# Patient Record
Sex: Male | Born: 1945 | Race: White | Hispanic: No | Marital: Married | Smoking: Former smoker
Health system: Southern US, Community
[De-identification: ages and names within clinical notes are randomized; demographics above are authoritative.]

## PROBLEM LIST (undated history)

## (undated) DIAGNOSIS — G8929 Other chronic pain: Secondary | ICD-10-CM

## (undated) DIAGNOSIS — I639 Cerebral infarction, unspecified: Secondary | ICD-10-CM

## (undated) DIAGNOSIS — G459 Transient cerebral ischemic attack, unspecified: Secondary | ICD-10-CM

## (undated) DIAGNOSIS — R413 Other amnesia: Secondary | ICD-10-CM

## (undated) DIAGNOSIS — M51369 Other intervertebral disc degeneration, lumbar region without mention of lumbar back pain or lower extremity pain: Secondary | ICD-10-CM

## (undated) DIAGNOSIS — H9319 Tinnitus, unspecified ear: Secondary | ICD-10-CM

## (undated) DIAGNOSIS — K219 Gastro-esophageal reflux disease without esophagitis: Secondary | ICD-10-CM

## (undated) DIAGNOSIS — I5042 Chronic combined systolic (congestive) and diastolic (congestive) heart failure: Secondary | ICD-10-CM

## (undated) DIAGNOSIS — R202 Paresthesia of skin: Secondary | ICD-10-CM

## (undated) DIAGNOSIS — J449 Chronic obstructive pulmonary disease, unspecified: Secondary | ICD-10-CM

## (undated) DIAGNOSIS — F419 Anxiety disorder, unspecified: Secondary | ICD-10-CM

## (undated) DIAGNOSIS — R200 Anesthesia of skin: Secondary | ICD-10-CM

## (undated) DIAGNOSIS — C61 Malignant neoplasm of prostate: Secondary | ICD-10-CM

## (undated) DIAGNOSIS — J189 Pneumonia, unspecified organism: Secondary | ICD-10-CM

## (undated) DIAGNOSIS — H9190 Unspecified hearing loss, unspecified ear: Secondary | ICD-10-CM

## (undated) DIAGNOSIS — M5136 Other intervertebral disc degeneration, lumbar region: Secondary | ICD-10-CM

## (undated) DIAGNOSIS — I10 Essential (primary) hypertension: Secondary | ICD-10-CM

## (undated) DIAGNOSIS — I519 Heart disease, unspecified: Secondary | ICD-10-CM

## (undated) DIAGNOSIS — M1990 Unspecified osteoarthritis, unspecified site: Secondary | ICD-10-CM

## (undated) DIAGNOSIS — I77819 Aortic ectasia, unspecified site: Secondary | ICD-10-CM

## (undated) DIAGNOSIS — I2510 Atherosclerotic heart disease of native coronary artery without angina pectoris: Secondary | ICD-10-CM

## (undated) DIAGNOSIS — M549 Dorsalgia, unspecified: Secondary | ICD-10-CM

## (undated) DIAGNOSIS — S82892A Other fracture of left lower leg, initial encounter for closed fracture: Secondary | ICD-10-CM

## (undated) DIAGNOSIS — G4700 Insomnia, unspecified: Secondary | ICD-10-CM

## (undated) DIAGNOSIS — S83242A Other tear of medial meniscus, current injury, left knee, initial encounter: Secondary | ICD-10-CM

## (undated) DIAGNOSIS — E785 Hyperlipidemia, unspecified: Secondary | ICD-10-CM

## (undated) DIAGNOSIS — I509 Heart failure, unspecified: Secondary | ICD-10-CM

## (undated) DIAGNOSIS — F32A Depression, unspecified: Secondary | ICD-10-CM

## (undated) DIAGNOSIS — R911 Solitary pulmonary nodule: Secondary | ICD-10-CM

## (undated) DIAGNOSIS — G709 Myoneural disorder, unspecified: Secondary | ICD-10-CM

## (undated) DIAGNOSIS — F329 Major depressive disorder, single episode, unspecified: Secondary | ICD-10-CM

## (undated) HISTORY — DX: Insomnia, unspecified: G47.00

## (undated) HISTORY — DX: Tinnitus, unspecified ear: H93.19

## (undated) HISTORY — PX: TONSILECTOMY/ADENOIDECTOMY WITH MYRINGOTOMY: SHX6125

## (undated) HISTORY — PX: COLONOSCOPY: SHX174

## (undated) HISTORY — DX: Major depressive disorder, single episode, unspecified: F32.9

## (undated) HISTORY — PX: VASECTOMY: SHX75

## (undated) HISTORY — DX: Depression, unspecified: F32.A

## (undated) HISTORY — DX: Other amnesia: R41.3

## (undated) HISTORY — PX: UPPER GI ENDOSCOPY: SHX6162

## (undated) HISTORY — DX: Transient cerebral ischemic attack, unspecified: G45.9

---
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
## 1998-09-10 ENCOUNTER — Ambulatory Visit: Admission: RE | Admit: 1998-09-10 | Discharge: 1998-09-10 | Payer: Self-pay | Admitting: Cardiology

## 1999-05-21 ENCOUNTER — Encounter: Admission: RE | Admit: 1999-05-21 | Discharge: 1999-07-12 | Payer: Self-pay | Admitting: Family Medicine

## 1999-07-27 ENCOUNTER — Encounter: Payer: Self-pay | Admitting: Orthopedic Surgery

## 1999-07-27 ENCOUNTER — Encounter: Admission: RE | Admit: 1999-07-27 | Discharge: 1999-07-27 | Payer: Self-pay | Admitting: Orthopedic Surgery

## 2003-10-01 HISTORY — PX: OTHER SURGICAL HISTORY: SHX169

## 2004-02-13 ENCOUNTER — Ambulatory Visit (HOSPITAL_COMMUNITY): Admission: RE | Admit: 2004-02-13 | Discharge: 2004-02-13 | Payer: Self-pay | Admitting: Orthopedic Surgery

## 2005-11-12 ENCOUNTER — Encounter (INDEPENDENT_AMBULATORY_CARE_PROVIDER_SITE_OTHER): Payer: Self-pay | Admitting: *Deleted

## 2005-11-12 ENCOUNTER — Ambulatory Visit (HOSPITAL_COMMUNITY): Admission: RE | Admit: 2005-11-12 | Discharge: 2005-11-12 | Payer: Self-pay | Admitting: Gastroenterology

## 2007-10-01 HISTORY — PX: SPINAL CORD STIMULATOR IMPLANT: SHX2422

## 2007-10-19 ENCOUNTER — Encounter: Admission: RE | Admit: 2007-10-19 | Discharge: 2007-10-19 | Payer: Self-pay | Admitting: Specialist

## 2008-09-30 DIAGNOSIS — R911 Solitary pulmonary nodule: Secondary | ICD-10-CM

## 2008-09-30 HISTORY — DX: Solitary pulmonary nodule: R91.1

## 2008-10-28 ENCOUNTER — Encounter: Admission: RE | Admit: 2008-10-28 | Discharge: 2008-10-28 | Payer: Self-pay | Admitting: Family Medicine

## 2010-10-21 ENCOUNTER — Encounter: Payer: Self-pay | Admitting: Specialist

## 2011-12-05 ENCOUNTER — Other Ambulatory Visit: Payer: Self-pay | Admitting: Orthopedic Surgery

## 2012-01-10 ENCOUNTER — Encounter (HOSPITAL_BASED_OUTPATIENT_CLINIC_OR_DEPARTMENT_OTHER): Payer: Self-pay | Admitting: *Deleted

## 2012-01-10 NOTE — Progress Notes (Signed)
NPO AFTER MN. ARRIVES AT 1030. NEEDS HG AND EKG. WILL TAKE MORPHINE AND FLEXERIL AM OF SURG. W/ SIP OF WATER.

## 2012-01-15 ENCOUNTER — Encounter (HOSPITAL_BASED_OUTPATIENT_CLINIC_OR_DEPARTMENT_OTHER)
Admission: RE | Disposition: A | Discharge status: Home / Self Care | Payer: Self-pay | Source: Ambulatory Visit | Attending: Orthopedic Surgery

## 2012-01-15 ENCOUNTER — Encounter (HOSPITAL_BASED_OUTPATIENT_CLINIC_OR_DEPARTMENT_OTHER): Payer: Self-pay | Admitting: *Deleted

## 2012-01-15 ENCOUNTER — Ambulatory Visit (HOSPITAL_BASED_OUTPATIENT_CLINIC_OR_DEPARTMENT_OTHER)
Admission: RE | Admit: 2012-01-15 | Discharge: 2012-01-15 | Disposition: A | Discharge status: Home / Self Care | Payer: BC Managed Care – PPO | Source: Ambulatory Visit | Attending: Orthopedic Surgery | Admitting: Orthopedic Surgery

## 2012-01-15 ENCOUNTER — Encounter (HOSPITAL_BASED_OUTPATIENT_CLINIC_OR_DEPARTMENT_OTHER): Payer: Self-pay | Admitting: Anesthesiology

## 2012-01-15 ENCOUNTER — Ambulatory Visit (HOSPITAL_BASED_OUTPATIENT_CLINIC_OR_DEPARTMENT_OTHER): Payer: BC Managed Care – PPO | Admitting: Anesthesiology

## 2012-01-15 ENCOUNTER — Encounter (HOSPITAL_BASED_OUTPATIENT_CLINIC_OR_DEPARTMENT_OTHER): Payer: Self-pay | Admitting: Orthopedic Surgery

## 2012-01-15 DIAGNOSIS — Z7982 Long term (current) use of aspirin: Secondary | ICD-10-CM | POA: Insufficient documentation

## 2012-01-15 DIAGNOSIS — Z79899 Other long term (current) drug therapy: Secondary | ICD-10-CM | POA: Insufficient documentation

## 2012-01-15 DIAGNOSIS — IMO0002 Reserved for concepts with insufficient information to code with codable children: Secondary | ICD-10-CM | POA: Insufficient documentation

## 2012-01-15 DIAGNOSIS — M2390 Unspecified internal derangement of unspecified knee: Secondary | ICD-10-CM | POA: Insufficient documentation

## 2012-01-15 DIAGNOSIS — X58XXXA Exposure to other specified factors, initial encounter: Secondary | ICD-10-CM | POA: Insufficient documentation

## 2012-01-15 DIAGNOSIS — S83249A Other tear of medial meniscus, current injury, unspecified knee, initial encounter: Secondary | ICD-10-CM

## 2012-01-15 HISTORY — PX: KNEE ARTHROSCOPY: SHX127

## 2012-01-15 HISTORY — DX: Other intervertebral disc degeneration, lumbar region without mention of lumbar back pain or lower extremity pain: M51.369

## 2012-01-15 HISTORY — DX: Other intervertebral disc degeneration, lumbar region: M51.36

## 2012-01-15 HISTORY — DX: Other chronic pain: G89.29

## 2012-01-15 HISTORY — DX: Unspecified osteoarthritis, unspecified site: M19.90

## 2012-01-15 HISTORY — DX: Dorsalgia, unspecified: M54.9

## 2012-01-15 HISTORY — DX: Other tear of medial meniscus, current injury, left knee, initial encounter: S83.242A

## 2012-01-15 LAB — POCT HEMOGLOBIN-HEMACUE: Hemoglobin: 13.8 g/dL (ref 13.0–17.0)

## 2012-01-15 SURGERY — ARTHROSCOPY, KNEE
Anesthesia: General | Site: Knee | Laterality: Left | Wound class: Clean

## 2012-01-15 MED ORDER — CEFAZOLIN SODIUM-DEXTROSE 2-3 GM-% IV SOLR
2.0000 g | INTRAVENOUS | Status: AC
  Administered 2012-01-15: 2 g via INTRAVENOUS

## 2012-01-15 MED ORDER — SODIUM CHLORIDE 0.9 % IV SOLN: INTRAVENOUS | Status: DC

## 2012-01-15 MED ORDER — LACTATED RINGERS IV SOLN
INTRAVENOUS | Status: DC
  Administered 2012-01-15 (×2): via INTRAVENOUS
  Administered 2012-01-15: 100 mL/h via INTRAVENOUS

## 2012-01-15 MED ORDER — OXYCODONE HCL 5 MG PO TABS
5.0000 mg | ORAL_TABLET | Freq: Once | ORAL | Status: AC
  Administered 2012-01-15: 5 mg via ORAL

## 2012-01-15 MED ORDER — LIDOCAINE HCL (CARDIAC) 20 MG/ML IV SOLN
INTRAVENOUS | Status: DC | PRN
  Administered 2012-01-15: 80 mg via INTRAVENOUS

## 2012-01-15 MED ORDER — PROPOFOL 10 MG/ML IV EMUL
INTRAVENOUS | Status: DC | PRN
  Administered 2012-01-15: 300 mg via INTRAVENOUS

## 2012-01-15 MED ORDER — OXYCODONE-ACETAMINOPHEN 10-325 MG PO TABS: 1.0000 | ORAL_TABLET | ORAL | Status: AC | PRN

## 2012-01-15 MED ORDER — METHOCARBAMOL 500 MG PO TABS: 500.0000 mg | ORAL_TABLET | Freq: Four times a day (QID) | ORAL | Status: AC

## 2012-01-15 MED ORDER — DEXAMETHASONE SODIUM PHOSPHATE 10 MG/ML IJ SOLN: 10.0000 mg | Freq: Once | INTRAMUSCULAR | Status: DC

## 2012-01-15 MED ORDER — DEXAMETHASONE SODIUM PHOSPHATE 4 MG/ML IJ SOLN
INTRAMUSCULAR | Status: DC | PRN
  Administered 2012-01-15: 10 mg via INTRAVENOUS

## 2012-01-15 MED ORDER — FENTANYL CITRATE 0.05 MG/ML IJ SOLN
INTRAMUSCULAR | Status: DC | PRN
  Administered 2012-01-15: 50 ug via INTRAVENOUS
  Administered 2012-01-15 (×4): 25 ug via INTRAVENOUS
  Administered 2012-01-15: 50 ug via INTRAVENOUS
  Administered 2012-01-15 (×4): 25 ug via INTRAVENOUS
  Administered 2012-01-15: 50 ug via INTRAVENOUS

## 2012-01-15 MED ORDER — ACETAMINOPHEN 10 MG/ML IV SOLN
1000.0000 mg | Freq: Once | INTRAVENOUS | Status: AC
  Administered 2012-01-15: 1000 mg via INTRAVENOUS

## 2012-01-15 MED ORDER — BUPIVACAINE-EPINEPHRINE 0.25% -1:200000 IJ SOLN
INTRAMUSCULAR | Status: DC | PRN
  Administered 2012-01-15: 20 mL

## 2012-01-15 MED ORDER — SODIUM CHLORIDE 0.9 % IR SOLN
Status: DC | PRN
  Administered 2012-01-15: 9000 mL

## 2012-01-15 MED ORDER — ONDANSETRON HCL 4 MG/2ML IJ SOLN
INTRAMUSCULAR | Status: DC | PRN
  Administered 2012-01-15: 4 mg via INTRAVENOUS

## 2012-01-15 MED ORDER — CHLORHEXIDINE GLUCONATE 4 % EX LIQD: 60.0000 mL | Freq: Once | CUTANEOUS | Status: DC

## 2012-01-15 SURGICAL SUPPLY — 32 items
BANDAGE ELASTIC 6 VELCRO ST LF (GAUZE/BANDAGES/DRESSINGS) ×3 IMPLANT
BLADE 4.2CUDA (BLADE) ×2 IMPLANT
BLADE CUDA SHAVER 3.5 (BLADE) IMPLANT
BLADE CUTTER GATOR 3.5 (BLADE) IMPLANT
CANISTER SUCT LVC 12 LTR MEDI- (MISCELLANEOUS) ×2 IMPLANT
CANISTER SUCTION 2500CC (MISCELLANEOUS) IMPLANT
CLOTH BEACON ORANGE TIMEOUT ST (SAFETY) ×2 IMPLANT
DRAPE ARTHROSCOPY W/POUCH 114 (DRAPES) ×2 IMPLANT
DRSG EMULSION OIL 3X3 NADH (GAUZE/BANDAGES/DRESSINGS) ×2 IMPLANT
DURAPREP 26ML APPLICATOR (WOUND CARE) ×2 IMPLANT
ELECT MENISCUS 165MM 90D (ELECTRODE) IMPLANT
ELECT REM PT RETURN 9FT ADLT (ELECTROSURGICAL)
ELECTRODE REM PT RTRN 9FT ADLT (ELECTROSURGICAL) IMPLANT
GLOVE BIO SURGEON STRL SZ8 (GLOVE) ×2 IMPLANT
GLOVE INDICATOR 8.0 STRL GRN (GLOVE) ×2 IMPLANT
GOWN PREVENTION PLUS LG XLONG (DISPOSABLE) ×4 IMPLANT
IV NS IRRIG 3000ML ARTHROMATIC (IV SOLUTION) ×3 IMPLANT
KNEE WRAP E Z 3 GEL PACK (MISCELLANEOUS) ×2 IMPLANT
PACK ARTHROSCOPY DSU (CUSTOM PROCEDURE TRAY) ×2 IMPLANT
PACK BASIN DAY SURGERY FS (CUSTOM PROCEDURE TRAY) ×2 IMPLANT
PADDING CAST ABS 4INX4YD NS (CAST SUPPLIES) ×1
PADDING CAST ABS COTTON 4X4 ST (CAST SUPPLIES) ×1 IMPLANT
PADDING CAST COTTON 6X4 STRL (CAST SUPPLIES) ×2 IMPLANT
PENCIL BUTTON HOLSTER BLD 10FT (ELECTRODE) IMPLANT
SET ARTHROSCOPY TUBING (MISCELLANEOUS) ×2
SET ARTHROSCOPY TUBING LN (MISCELLANEOUS) ×1 IMPLANT
SPONGE GAUZE 4X4 12PLY (GAUZE/BANDAGES/DRESSINGS) ×2 IMPLANT
SUT ETHILON 4 0 PS 2 18 (SUTURE) ×2 IMPLANT
TOWEL OR 17X24 6PK STRL BLUE (TOWEL DISPOSABLE) ×2 IMPLANT
WAND 30 DEG SABER W/CORD (SURGICAL WAND) IMPLANT
WAND 90 DEG TURBOVAC W/CORD (SURGICAL WAND) ×1 IMPLANT
WATER STERILE IRR 500ML POUR (IV SOLUTION) ×2 IMPLANT

## 2012-01-15 NOTE — Op Note (Signed)
Preoperative diagnosis-  Left knee medial meniscal tear  Postoperative diagnosis Left- knee medial meniscal tear   Plus Left medial chondral defect  Procedure- Left knee arthroscopy with medial  Meniscal debridement and chondroplasty  Surgeon- Gus Rankin. Tiler Brandis, MD  Anesthesia-General  EBL-  minimal Complications- None  Condition- PACU - hemodynamically stable.  Brief clinical note- -Joseph Foster is a 66 y.o.  male with a several month history of left knee pain and mechanical symptoms. Exam and history suggested medial meniscal tear confirmed by MRI. The patient presents now for arthroscopy and debridement   Procedure in detail -       After successful administration of General anesthetic, a tourmiquet is placed high on the Left  thigh and the Left lower extremity is prepped and draped in the usual sterile fashion. Time out is performed by the surgical team. Standard superomedial and inferolateral portal sites are marked and incisions made with an 11 blade. The inflow cannula is passed through the superomedial portal and camera through the inferolateral portal and inflow is initiated. Arthroscopic visualization proceeds.      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 The medial and lateral gutters are visualized and there are  2 loose bodies which were removed through the inferomedial portal after it was started. Flexion and valgus force is applied to the knee and the medial compartment is entered. A spinal needle is passed into the joint through the site marked for the inferomedial portal. A small incision is made and the dilator passed into the joint. The findings for the medial compartment are posterior horn medial meniscal tear and small chondral defect medial femoral condyle . The tear is debrided to a stable base with baskets and a shaver and sealed off with the  Arthrocare. The shaver is used to debride the unstable cartilage to a stable cartilaginous base with stable edges. It is probed and found to be stable.    The intercondylar notch is visualized and the ACL appears normal. The lateral compartment is entered and the findings are mild surface irregularity of the lateral meniscus otherwise normal lateral compartment. .      The joint is again inspected and there are no other tears, defects or loose bodies identified. The arthroscopic equipment is then removed from the inferior portals which are closed with interrupted 4-0 nylon. 20 ml of .25% Marcaine with epinephrine are injected through the inflow cannula and the cannula is then removed and the portal closed with nylon. The incisions are cleaned and dried and a bulky sterile dressing is applied. The patient is then awakened and transported to recovery in stable condition.   01/15/2012, 12:48 PM

## 2012-01-15 NOTE — Interval H&P Note (Signed)
History and Physical Interval Note:  01/15/2012 12:08 PM  Joseph Foster  has presented today for surgery, with the diagnosis of left knee medial meniscal tear  The various methods of treatment have been discussed with the patient and family. After consideration of risks, benefits and other options for treatment, the patient has consented to  Procedure(s) (LRB): ARTHROSCOPY KNEE (Left) as a surgical intervention .  The patients' history has been reviewed, patient examined, no change in status, stable for surgery.  I have reviewed the patients' chart and labs.  Questions were answered to the patient's satisfaction.     Loanne Drilling

## 2012-01-15 NOTE — Anesthesia Postprocedure Evaluation (Signed)
  Anesthesia Post-op Note  Patient: Joseph Foster  Procedure(s) Performed: Procedure(s) (LRB): ARTHROSCOPY KNEE (Left)  Patient Location: PACU  Anesthesia Type: General  Level of Consciousness: oriented and sedated  Airway and Oxygen Therapy: Patient Spontanous Breathing  Post-op Pain: mild  Post-op Assessment: Post-op Vital signs reviewed, Patient's Cardiovascular Status Stable, Respiratory Function Stable and Patent Airway  Post-op Vital Signs: stable  Complications: No apparent anesthesia complications

## 2012-01-15 NOTE — H&P (Signed)
  CC- [name] is a 66 y.o. male who presents with left knee pain.  HPI- . Knee Pain: Patient presents with knee pain involving the  left knee. Onset of the symptoms was several months ago. Inciting event: none known. Current symptoms include pain located medially. Pain is aggravated by pivoting, rising after sitting, squatting and walking.  Patient has had no prior knee problems. Evaluation to date: plain films: normal. Treatment to date: corticosteroid injection which was not very effective.  Past Medical History  Diagnosis Date  . DDD (degenerative disc disease), lumbar L4  . Chronic back pain   . Arthritis KNEES  . Acute medial meniscus tear of left knee     Past Surgical History  Procedure Date  . Spinal cord stimulator implant 2009  . Right knee surg. 2005    Prior to Admission medications   Medication Sig Start Date End Date Taking? Authorizing Provider  aspirin EC 325 MG tablet Take 175.5 mg by mouth daily.   Yes Historical Provider, MD  cyclobenzaprine (FLEXERIL) 10 MG tablet Take 10 mg by mouth every morning.   Yes Historical Provider, MD  morphine (MSIR) 15 MG tablet Take 15 mg by mouth every morning.   Yes Historical Provider, MD  Multiple Vitamin (MULTIVITAMIN) tablet Take 1 tablet by mouth daily.   Yes Historical Provider, MD  PARoxetine (PAXIL) 10 MG tablet Take 10 mg by mouth every morning.   Yes Historical Provider, MD   KNEE EXAM antalgic gait, soft tissue tenderness over medial joint line, collateral ligaments intact, normal ipsilateral hip exam  Physical Examination: General appearance - alert, well appearing, and in no distress Mental status - alert, oriented to person, place, and time Chest - clear to auscultation, no wheezes, rales or rhonchi, symmetric air entry Heart - normal rate, regular rhythm, normal S1, S2, no murmurs, rubs, clicks or gallops Abdomen - soft, nontender, nondistended, no masses or organomegaly Neurological - alert, oriented,  normal speech, no focal findings or movement disorder noted   Asessment/Plan--- Left knee medial meniscal tear- - Plan left knee arthroscopy with meniscal debridement. Procedure risks and potential comps discussed with patient who elects to proceed. Goals are decreased pain and increased function with a high likelihood of achieving both

## 2012-01-15 NOTE — Discharge Instructions (Signed)
Arthroscopic Procedure, Knee An arthroscopic procedure can find what is wrong with your knee. PROCEDURE Arthroscopy is a surgical technique that allows your orthopedic surgeon to diagnose and treat your knee injury with accuracy. They will look into your knee through a small instrument. This is almost like a small (pencil sized) telescope. Because arthroscopy affects your knee less than open knee surgery, you can anticipate a more rapid recovery. Taking an active role by following your caregiver's instructions will help with rapid and complete recovery. Use crutches, rest, elevation, ice, and knee exercises as instructed. The length of recovery depends on various factors including type of injury, age, physical condition, medical conditions, and your rehabilitation. Your knee is the joint between the large bones (femur and tibia) in your leg. Cartilage covers these bone ends which are smooth and slippery and allow your knee to bend and move smoothly. Two menisci, thick, semi-lunar shaped pads of cartilage which form a rim inside the joint, help absorb shock and stabilize your knee. Ligaments bind the bones together and support your knee joint. Muscles move the joint, help support your knee, and take stress off the joint itself. Because of this all programs and physical therapy to rehabilitate an injured or repaired knee require rebuilding and strengthening your muscles. AFTER THE PROCEDURE  After the procedure, you will be moved to a recovery area until most of the effects of the medication have worn off. Your caregiver will discuss the test results with you.   Only take over-the-counter or prescription medicines for pain, discomfort, or fever as directed by your caregiver.  SEEK MEDICAL CARE IF:   You have increased bleeding from your wounds.   You see redness, swelling, or have increasing pain in your wounds.   You have pus coming from your wound.   You have an oral temperature above 102 F (38.9  C).   You notice a bad smell coming from the wound or dressing.   You have severe pain with any motion of your knee.  SEEK IMMEDIATE MEDICAL CARE IF:   You develop a rash.   You have difficulty breathing.   You have any allergic problems.  Document Released: 09/13/2000 Document Revised: 09/05/2011 Document Reviewed: 04/06/2008 [employer] Patient Information 2012 [employer] Post Anesthesia Home Care Instructions  Activity: Get plenty of rest for the remainder of the day. A responsible adult should stay with you for 24 hours following the procedure.  For the next 24 hours, DO NOT: -Drive a car -Operate machinery -Drink alcoholic beverages -Take any medication unless instructed by your physician -Make any legal decisions or sign important papers.  Meals: Start with liquid foods such as gelatin or soup. Progress to regular foods as tolerated. Avoid greasy, spicy, heavy foods. If nausea and/or vomiting occur, drink only clear liquids until the nausea and/or vomiting subsides. Call your physician if vomiting continues.  Special Instructions/Symptoms: Your throat may feel dry or sore from the anesthesia or the breathing tube placed in your throat during surgery. If this causes discomfort, gargle with warm salt water. The discomfort should disappear within 24 hours.  . 

## 2012-01-15 NOTE — Anesthesia Procedure Notes (Signed)
Procedure Name: LMA Insertion Date/Time: 01/15/2012 12:14 PM Performed by: Fran Lowes Pre-anesthesia Checklist: Patient identified, Emergency Drugs available, Suction available and Patient being monitored Patient Re-evaluated:Patient Re-evaluated prior to inductionOxygen Delivery Method: Circle System Utilized Preoxygenation: Pre-oxygenation with 100% oxygen Intubation Type: IV induction Ventilation: Mask ventilation without difficulty LMA: LMA inserted LMA Size: 5.0 Number of attempts: 1 Airway Equipment and Method: bite block Placement Confirmation: positive ETCO2 Tube secured with: Tape Dental Injury: Teeth and Oropharynx as per pre-operative assessment  Comments: LMA inserted by Dr. Shireen Quan.

## 2012-01-15 NOTE — Transfer of Care (Signed)
Immediate Anesthesia Transfer of Care Note  Patient: Joseph Foster  Procedure(s) Performed: Procedure(s) (LRB): ARTHROSCOPY KNEE (Left)  Patient Location: Patient transported to PACU with oxygen via face mask at 4 Liters / Min  Anesthesia Type: General  Level of Consciousness: awake and alert   Airway & Oxygen Therapy: Patient Spontanous Breathing and Patient connected to face mask oxygen  Post-op Assessment: Report given to PACU RN and Post -op Vital signs reviewed and stable  Post vital signs: Reviewed and stable  Dentition: Teeth and oropharynx remain in pre-op condition  Complications: No apparent anesthesia complications

## 2012-01-15 NOTE — Anesthesia Preprocedure Evaluation (Signed)
Anesthesia Evaluation  Patient identified by MRN, date of birth, ID band Patient awake    Reviewed: Allergy & Precautions, H&P , NPO status , Patient's Chart, lab work & pertinent test results, reviewed documented beta blocker date and time   Airway Mallampati: II TM Distance: >3 FB Neck ROM: Full    Dental  (+) Teeth Intact   Pulmonary neg pulmonary ROS,  breath sounds clear to auscultation        Cardiovascular negative cardio ROS  Rhythm:Regular Rate:Normal  denies cardiac symptomos   Neuro/Psych Chronic back pain Nerve stim/narcotics negative psych ROS   GI/Hepatic negative GI ROS, Neg liver ROS,   Endo/Other  negative endocrine ROS  Renal/GU negative Renal ROS  negative genitourinary   Musculoskeletal   Abdominal   Peds negative pediatric ROS (+)  Hematology negative hematology ROS (+)   Anesthesia Other Findings   Reproductive/Obstetrics negative OB ROS                           Anesthesia Physical Anesthesia Plan  ASA: II  Anesthesia Plan: General   Post-op Pain Management:    Induction: Intravenous  Airway Management Planned: LMA  Additional Equipment:   Intra-op Plan:   Post-operative Plan: Extubation in OR  Informed Consent: I have reviewed the patients History and Physical, chart, labs and discussed the procedure including the risks, benefits and alternatives for the proposed anesthesia with the patient or authorized representative who has indicated his/her understanding and acceptance.   Dental advisory given  Plan Discussed with: CRNA and Surgeon  Anesthesia Plan Comments:         Anesthesia Quick Evaluation

## 2012-01-16 ENCOUNTER — Encounter (HOSPITAL_BASED_OUTPATIENT_CLINIC_OR_DEPARTMENT_OTHER): Payer: Self-pay | Admitting: Orthopedic Surgery

## 2012-01-21 ENCOUNTER — Encounter (HOSPITAL_BASED_OUTPATIENT_CLINIC_OR_DEPARTMENT_OTHER): Payer: Self-pay

## 2012-03-20 ENCOUNTER — Other Ambulatory Visit: Payer: Self-pay | Admitting: Gastroenterology

## 2013-04-19 ENCOUNTER — Ambulatory Visit (INDEPENDENT_AMBULATORY_CARE_PROVIDER_SITE_OTHER): Payer: BC Managed Care – PPO | Admitting: Diagnostic Neuroimaging

## 2013-04-19 ENCOUNTER — Encounter: Payer: Self-pay | Admitting: Diagnostic Neuroimaging

## 2013-04-19 VITALS — BP 131/91 | HR 75 | Temp 97.8°F | Ht 74.5 in | Wt 251.0 lb

## 2013-04-19 DIAGNOSIS — R209 Unspecified disturbances of skin sensation: Secondary | ICD-10-CM

## 2013-04-19 DIAGNOSIS — R200 Anesthesia of skin: Secondary | ICD-10-CM | POA: Insufficient documentation

## 2013-04-19 NOTE — Patient Instructions (Signed)
I will check electrical nerve testing.

## 2013-04-19 NOTE — Progress Notes (Signed)
[facility]  PATIENT: Joseph Foster DOB: [birth year]  REFERRING CLINICIAN: Little HISTORY FROM: patient REASON FOR VISIT: new consult   HISTORICAL  CHIEF COMPLAINT:  Chief Complaint  Patient presents with  . Neurologic Problem    np#[id number]    HISTORY OF PRESENT ILLNESS:   67 year old male, right-handed, with history of anxiety, back pain, knee pain, here for evaluation of lower extremity numbness and tingling. He last 3 months he describes burning, tingling sensation on the bottom of his feet. His nose chronic low back pain, status post spinal cord stimulator 3 years ago. No low back decompression surgery.   Numbness is confined to the bottom of feet. No symptoms on the top of his feet or lower legs he has some numbness in his left thumb.  REVIEW OF SYSTEMS: Full 14 system review of systems performed and notable only for numbness hearing loss ringing constipation impotence try pain feeling hot feeling cold decreased energy.  ALLERGIES: Allergies  Allergen Reactions  . Ceclor (Cefaclor) Itching  . Penicillins Hives    HOME MEDICATIONS: Outpatient Prescriptions Prior to Visit  Medication Sig Dispense Refill  . ALPRAZolam (XANAX) 0.5 MG tablet Take 0.5 mg by mouth at bedtime as needed.      [name] aspirin EC 325 MG tablet Take 175.5 mg by mouth daily.      . cyclobenzaprine (FLEXERIL) 10 MG tablet Take 10 mg by mouth every morning.      [name] morphine (MSIR) 15 MG tablet Take 15 mg by mouth every morning.      . Multiple Vitamin (MULTIVITAMIN) tablet Take 1 tablet by mouth daily.      [name] PARoxetine (PAXIL) 10 MG tablet Take 10 mg by mouth every morning.       No facility-administered medications prior to visit.    PAST MEDICAL HISTORY: Past Medical History  Diagnosis Date  . DDD (degenerative disc disease), lumbar L4  . Chronic back pain   . Arthritis KNEES  . Acute medial meniscus tear of left knee     PAST SURGICAL HISTORY: Past Surgical History    Procedure Laterality Date  . Spinal cord stimulator implant  2009  . Right knee surg.  2005  . Knee arthroscopy  01/15/2012    Procedure: ARTHROSCOPY KNEE;  Surgeon: Loanne Drilling, MD;  Location: [facility];  Service: Orthopedics;  Laterality: Left;  meniscal debridement    FAMILY HISTORY: Family History  Problem Relation Age of Onset  . Lung cancer Father     SOCIAL HISTORY:  History   Social History  . Marital Status: Married    Spouse Name: N/A    Number of Children: 3  . Years of Education: N/A   Occupational History  . [employer]    Social History Main Topics  . Smoking status: Former Smoker    Types: Cigars    Quit date: 01/09/2001  . Smokeless tobacco: Never Used  . Alcohol Use: 5.4 oz/week    6 Cans of beer, 3 Shots of liquor per week  . Drug Use: No  . Sexually Active:    Other Topics Concern  . Not on file   Social History Narrative  . No narrative on file     PHYSICAL EXAM  Filed Vitals:   04/19/13 1126  BP: 131/91  Pulse: 75  Temp: 97.8 F (36.6 C)  TempSrc: Oral  Height: 6' 2.5" (1.892 m)  Weight: 251 lb (113.853 kg)    Not recorded  Body mass index is 31.81 kg/(m^2).  GENERAL EXAM: Patient is in no distress; STRAIGHT LEG RAISE NEG; POSITIVE PHALENS.  CARDIOVASCULAR: Regular rate and rhythm, no murmurs, no carotid bruits  NEUROLOGIC: MENTAL STATUS: awake, alert, language fluent, comprehension intact, naming intact CRANIAL NERVE: no papilledema on fundoscopic exam, pupils equal and reactive to light, visual fields full to confrontation, extraocular muscles intact, no nystagmus, facial sensation and strength symmetric, uvula midline, shoulder shrug symmetric, tongue midline. MOTOR: normal bulk and tone, full strength in the BUE, BLE; LEFT APB ATROPHY.  SENSORY: ABSENT VIB AT TOES. DECR PP IN FEET. COORDINATION: finger-nose-finger, fine finger movements normal REFLEXES: BUE AND KNEES 1, ANKLES 0. DOWN GOING  TOES. GAIT/STATION: narrow based gait; DIFF WITH TOE AND HEEL. ABLE TO TANDEM. ROMBERG NEG.    DIAGNOSTIC DATA (LABS, IMAGING, TESTING) - I reviewed patient records, labs, notes, testing and imaging myself where available.  Lab Results  Component Value Date   HGB 13.8 01/15/2012   No results found for this basename: na, k, cl, co2, glucose, bun, creatinine, calcium, prot, albumin, ast, alt, alkphos, bilitot, gfrnonaa, gfraa   No results found for this basename: CHOL, HDL, LDLCALC, LDLDIRECT, TRIG, CHOLHDL   No results found for this basename: HGBA1C   No results found for this basename: VITAMINB12   No results found for this basename: TSH    B12 415, TSH 0.83   ASSESSMENT AND PLAN  67 y.o. year old male  has a past medical history of DDD (degenerative disc disease), lumbar (L4); Chronic back pain; Arthritis (KNEES); and Acute medial meniscus tear of left knee. here with numbness/buring in bottom of feet.  Ddx: neuropathy vs lumbar radiculopathy  PLAN: 1. EMG/NCS 2. Consider CT myelogram (he should discuss with spine surgeons first) 3. Consider gabapentin (pt wants to hold off for now)   Orders Placed This Encounter  Procedures  . NCV with EMG(electromyography)     Suanne Marker, MD 04/19/2013, 12:23 PM Certified in Neurology, Neurophysiology and Neuroimaging  [facility] [address] [place], [place] [address] [phone]

## 2013-04-20 ENCOUNTER — Encounter: Payer: Self-pay | Admitting: Diagnostic Neuroimaging

## 2013-04-29 ENCOUNTER — Ambulatory Visit (INDEPENDENT_AMBULATORY_CARE_PROVIDER_SITE_OTHER): Payer: BC Managed Care – PPO | Admitting: Diagnostic Neuroimaging

## 2013-04-29 ENCOUNTER — Encounter (INDEPENDENT_AMBULATORY_CARE_PROVIDER_SITE_OTHER): Payer: BC Managed Care – PPO

## 2013-04-29 DIAGNOSIS — R200 Anesthesia of skin: Secondary | ICD-10-CM

## 2013-04-29 DIAGNOSIS — Z0289 Encounter for other administrative examinations: Secondary | ICD-10-CM

## 2013-04-29 DIAGNOSIS — R209 Unspecified disturbances of skin sensation: Secondary | ICD-10-CM

## 2013-05-06 NOTE — Procedures (Signed)
   [facility]  NCS (NERVE CONDUCTION STUDY) WITH EMG (ELECTROMYOGRAPHY) REPORT   STUDY DATE: 04/29/13 PATIENT NAME: Joseph Foster DOB: 11/20/1945 MRN: [id number]  ORDERING CLINICIAN: Joycelyn Schmid, MD   TECHNOLOGIST: Gearldine Shown ELECTROMYOGRAPHER: Glenford Bayley. Govani Radloff, MD  CLINICAL INFORMATION: 67 year old male with numbness and pain in the lower extremities.  FINDINGS: NERVE CONDUCTION STUDY: Right peroneal motor response with recording over the extensor digitorum brevis muscle could not be obtained. Right peroneal motor response with recording over the tibial anterior muscle demonstrates normal distal latency, decreased amplitude, normal conduction velocity. Left peroneal motor response with recording over the extensor digitorum brevis muscle demonstrates decreased amplitude, normal conduction velocity and normal F-wave latency.  Bilateral tibial motor responses have normal distal latencies, amplitudes, conduction velocities and F-wave latencies.   Bilateral peroneal sensory responses are normal.   NEEDLE ELECTROMYOGRAPHY: Needle examination of selected muscles of the right lower extremity (vastus medialis, tibialis anterior, gastrocnemius) demonstrates no abnormal spontaneous activity at rest and normal motor unit recruitment on exertion. Examination of bilateral L5-S1 paraspinal muscles demonstrate complex repetitive discharges and 1+ positive sharp waves in the right L5-S1 region.   IMPRESSION:  Abnormal study demonstrating: 1. Electrodiagnostic evidence of bilateral L5 radiculopathies. 2. No evidence of widespread polyneuropathy.   INTERPRETING PHYSICIAN:  Suanne Marker, MD Certified in Neurology, Neurophysiology and Neuroimaging  [facility] [address] [place], [place] [address] [phone]

## 2013-05-27 DIAGNOSIS — G8929 Other chronic pain: Secondary | ICD-10-CM | POA: Insufficient documentation

## 2013-05-27 DIAGNOSIS — M25569 Pain in unspecified knee: Secondary | ICD-10-CM | POA: Insufficient documentation

## 2013-05-27 DIAGNOSIS — G894 Chronic pain syndrome: Secondary | ICD-10-CM | POA: Insufficient documentation

## 2013-09-30 DIAGNOSIS — G459 Transient cerebral ischemic attack, unspecified: Secondary | ICD-10-CM

## 2013-09-30 HISTORY — DX: Transient cerebral ischemic attack, unspecified: G45.9

## 2014-08-22 ENCOUNTER — Inpatient Hospital Stay (HOSPITAL_COMMUNITY)
Admission: EM | Admit: 2014-08-22 | Discharge: 2014-08-23 | DRG: 069 | Disposition: A | Discharge status: Home / Self Care | Payer: BC Managed Care – PPO | Attending: Internal Medicine | Admitting: Internal Medicine

## 2014-08-22 ENCOUNTER — Emergency Department (HOSPITAL_COMMUNITY): Payer: BC Managed Care – PPO

## 2014-08-22 ENCOUNTER — Encounter (HOSPITAL_COMMUNITY): Payer: Self-pay | Admitting: Family Medicine

## 2014-08-22 DIAGNOSIS — R451 Restlessness and agitation: Secondary | ICD-10-CM | POA: Insufficient documentation

## 2014-08-22 DIAGNOSIS — G4733 Obstructive sleep apnea (adult) (pediatric): Secondary | ICD-10-CM | POA: Diagnosis present

## 2014-08-22 DIAGNOSIS — Z9852 Vasectomy status: Secondary | ICD-10-CM

## 2014-08-22 DIAGNOSIS — R410 Disorientation, unspecified: Secondary | ICD-10-CM | POA: Diagnosis not present

## 2014-08-22 DIAGNOSIS — G4730 Sleep apnea, unspecified: Secondary | ICD-10-CM | POA: Diagnosis present

## 2014-08-22 DIAGNOSIS — R200 Anesthesia of skin: Secondary | ICD-10-CM

## 2014-08-22 DIAGNOSIS — R29810 Facial weakness: Secondary | ICD-10-CM | POA: Diagnosis present

## 2014-08-22 DIAGNOSIS — F329 Major depressive disorder, single episode, unspecified: Secondary | ICD-10-CM | POA: Diagnosis present

## 2014-08-22 DIAGNOSIS — E669 Obesity, unspecified: Secondary | ICD-10-CM | POA: Diagnosis present

## 2014-08-22 DIAGNOSIS — N400 Enlarged prostate without lower urinary tract symptoms: Secondary | ICD-10-CM | POA: Diagnosis present

## 2014-08-22 DIAGNOSIS — M5136 Other intervertebral disc degeneration, lumbar region: Secondary | ICD-10-CM | POA: Diagnosis present

## 2014-08-22 DIAGNOSIS — Z6833 Body mass index (BMI) 33.0-33.9, adult: Secondary | ICD-10-CM

## 2014-08-22 DIAGNOSIS — E785 Hyperlipidemia, unspecified: Secondary | ICD-10-CM | POA: Diagnosis present

## 2014-08-22 DIAGNOSIS — M1990 Unspecified osteoarthritis, unspecified site: Secondary | ICD-10-CM | POA: Diagnosis present

## 2014-08-22 DIAGNOSIS — Z720 Tobacco use: Secondary | ICD-10-CM | POA: Diagnosis present

## 2014-08-22 DIAGNOSIS — F17210 Nicotine dependence, cigarettes, uncomplicated: Secondary | ICD-10-CM | POA: Diagnosis present

## 2014-08-22 DIAGNOSIS — Z7982 Long term (current) use of aspirin: Secondary | ICD-10-CM

## 2014-08-22 DIAGNOSIS — J449 Chronic obstructive pulmonary disease, unspecified: Secondary | ICD-10-CM | POA: Diagnosis present

## 2014-08-22 DIAGNOSIS — G894 Chronic pain syndrome: Secondary | ICD-10-CM | POA: Diagnosis present

## 2014-08-22 DIAGNOSIS — F32A Depression, unspecified: Secondary | ICD-10-CM | POA: Diagnosis present

## 2014-08-22 DIAGNOSIS — G8929 Other chronic pain: Secondary | ICD-10-CM | POA: Diagnosis present

## 2014-08-22 DIAGNOSIS — M549 Dorsalgia, unspecified: Secondary | ICD-10-CM | POA: Diagnosis present

## 2014-08-22 DIAGNOSIS — F418 Other specified anxiety disorders: Secondary | ICD-10-CM | POA: Diagnosis present

## 2014-08-22 DIAGNOSIS — S83249A Other tear of medial meniscus, current injury, unspecified knee, initial encounter: Secondary | ICD-10-CM | POA: Diagnosis present

## 2014-08-22 DIAGNOSIS — G454 Transient global amnesia: Secondary | ICD-10-CM

## 2014-08-22 DIAGNOSIS — G452 Multiple and bilateral precerebral artery syndromes: Secondary | ICD-10-CM

## 2014-08-22 DIAGNOSIS — Z880 Allergy status to penicillin: Secondary | ICD-10-CM | POA: Diagnosis not present

## 2014-08-22 DIAGNOSIS — F1010 Alcohol abuse, uncomplicated: Secondary | ICD-10-CM | POA: Diagnosis present

## 2014-08-22 DIAGNOSIS — G451 Carotid artery syndrome (hemispheric): Secondary | ICD-10-CM

## 2014-08-22 DIAGNOSIS — G459 Transient cerebral ischemic attack, unspecified: Principal | ICD-10-CM | POA: Diagnosis present

## 2014-08-22 LAB — URINALYSIS, ROUTINE W REFLEX MICROSCOPIC
Bilirubin Urine: NEGATIVE
Glucose, UA: NEGATIVE mg/dL
Ketones, ur: NEGATIVE mg/dL
Leukocytes, UA: NEGATIVE
Nitrite: NEGATIVE
Protein, ur: NEGATIVE mg/dL
Specific Gravity, Urine: 1.014 (ref 1.005–1.030)
Urobilinogen, UA: 0.2 mg/dL (ref 0.0–1.0)
pH: 6 (ref 5.0–8.0)

## 2014-08-22 LAB — URINE MICROSCOPIC-ADD ON

## 2014-08-22 LAB — RAPID URINE DRUG SCREEN, HOSP PERFORMED
Amphetamines: NOT DETECTED
Barbiturates: NOT DETECTED
Benzodiazepines: POSITIVE — AB
Cocaine: NOT DETECTED
Opiates: POSITIVE — AB
Tetrahydrocannabinol: NOT DETECTED

## 2014-08-22 LAB — CBC
HCT: 40.7 % (ref 39.0–52.0)
Hemoglobin: 13.8 g/dL (ref 13.0–17.0)
MCH: 30.7 pg (ref 26.0–34.0)
MCHC: 33.9 g/dL (ref 30.0–36.0)
MCV: 90.6 fL (ref 78.0–100.0)
Platelets: 199 10*3/uL (ref 150–400)
RBC: 4.49 MIL/uL (ref 4.22–5.81)
RDW: 13.1 % (ref 11.5–15.5)
WBC: 5.9 10*3/uL (ref 4.0–10.5)

## 2014-08-22 LAB — CBG MONITORING, ED: Glucose-Capillary: 114 mg/dL — ABNORMAL HIGH (ref 70–99)

## 2014-08-22 LAB — I-STAT TROPONIN, ED: Troponin i, poc: 0.01 ng/mL (ref 0.00–0.08)

## 2014-08-22 LAB — DIFFERENTIAL
Basophils Absolute: 0.1 10*3/uL (ref 0.0–0.1)
Basophils Relative: 1 % (ref 0–1)
Eosinophils Absolute: 0.3 10*3/uL (ref 0.0–0.7)
Eosinophils Relative: 5 % (ref 0–5)
Lymphocytes Relative: 33 % (ref 12–46)
Lymphs Abs: 2 10*3/uL (ref 0.7–4.0)
Monocytes Absolute: 0.5 10*3/uL (ref 0.1–1.0)
Monocytes Relative: 8 % (ref 3–12)
Neutro Abs: 3.2 10*3/uL (ref 1.7–7.7)
Neutrophils Relative %: 53 % (ref 43–77)

## 2014-08-22 LAB — COMPREHENSIVE METABOLIC PANEL
ALT: 16 U/L (ref 0–53)
AST: 18 U/L (ref 0–37)
Albumin: 3.5 g/dL (ref 3.5–5.2)
Alkaline Phosphatase: 29 U/L — ABNORMAL LOW (ref 39–117)
Anion gap: 12 (ref 5–15)
BUN: 24 mg/dL — ABNORMAL HIGH (ref 6–23)
CO2: 24 mEq/L (ref 19–32)
Calcium: 8.9 mg/dL (ref 8.4–10.5)
Chloride: 102 mEq/L (ref 96–112)
Creatinine, Ser: 1.04 mg/dL (ref 0.50–1.35)
GFR calc Af Amer: 84 mL/min — ABNORMAL LOW (ref >90)
GFR calc non Af Amer: 73 mL/min — ABNORMAL LOW (ref >90)
Glucose, Bld: 112 mg/dL — ABNORMAL HIGH (ref 70–99)
Potassium: 4.9 mEq/L (ref 3.7–5.3)
Sodium: 138 mEq/L (ref 137–147)
Total Bilirubin: <0.2 mg/dL — ABNORMAL LOW (ref 0.3–1.2)
Total Protein: 7.2 g/dL (ref 6.0–8.3)

## 2014-08-22 LAB — PROTIME-INR
INR: 1.1 (ref 0.00–1.49)
Prothrombin Time: 14.3 seconds (ref 11.6–15.2)

## 2014-08-22 LAB — GLUCOSE, CAPILLARY: Glucose-Capillary: 103 mg/dL — ABNORMAL HIGH (ref 70–99)

## 2014-08-22 LAB — HEMOGLOBIN A1C
Hgb A1c MFr Bld: 5.6 % (ref <5.7)
Mean Plasma Glucose: 114 mg/dL (ref <117)

## 2014-08-22 LAB — APTT: aPTT: 38 seconds — ABNORMAL HIGH (ref 24–37)

## 2014-08-22 MED ORDER — LORAZEPAM 1 MG PO TABS: 1.0000 mg | ORAL_TABLET | Freq: Four times a day (QID) | ORAL | Status: DC | PRN

## 2014-08-22 MED ORDER — MORPHINE SULFATE ER 15 MG PO TBCR
30.0000 mg | EXTENDED_RELEASE_TABLET | Freq: Two times a day (BID) | ORAL | Status: DC
  Administered 2014-08-22 – 2014-08-23 (×2): 30 mg via ORAL
  Filled 2014-08-22 (×2): qty 2

## 2014-08-22 MED ORDER — VITAMIN B-1 100 MG PO TABS
100.0000 mg | ORAL_TABLET | Freq: Every day | ORAL | Status: DC
  Administered 2014-08-22: 100 mg via ORAL
  Filled 2014-08-22 (×2): qty 1

## 2014-08-22 MED ORDER — ENOXAPARIN SODIUM 40 MG/0.4ML ~~LOC~~ SOLN
40.0000 mg | SUBCUTANEOUS | Status: DC
  Administered 2014-08-22: 40 mg via SUBCUTANEOUS
  Filled 2014-08-22: qty 0.4

## 2014-08-22 MED ORDER — NICOTINE 14 MG/24HR TD PT24
14.0000 mg | MEDICATED_PATCH | TRANSDERMAL | Status: DC
  Filled 2014-08-22: qty 1

## 2014-08-22 MED ORDER — MORPHINE SULFATE ER 15 MG PO TBCR: 30.0000 mg | EXTENDED_RELEASE_TABLET | Freq: Two times a day (BID) | ORAL | Status: DC

## 2014-08-22 MED ORDER — SODIUM CHLORIDE 0.9 % IV SOLN
INTRAVENOUS | Status: AC
  Administered 2014-08-22: 15:00:00 via INTRAVENOUS

## 2014-08-22 MED ORDER — DOCUSATE SODIUM 100 MG PO CAPS
100.0000 mg | ORAL_CAPSULE | Freq: Two times a day (BID) | ORAL | Status: DC
  Administered 2014-08-22 – 2014-08-23 (×2): 100 mg via ORAL
  Filled 2014-08-22 (×3): qty 1

## 2014-08-22 MED ORDER — FOLIC ACID 1 MG PO TABS
1.0000 mg | ORAL_TABLET | Freq: Every day | ORAL | Status: DC
  Administered 2014-08-22 – 2014-08-23 (×2): 1 mg via ORAL
  Filled 2014-08-22 (×2): qty 1

## 2014-08-22 MED ORDER — ONDANSETRON HCL 4 MG PO TABS: 4.0000 mg | ORAL_TABLET | Freq: Three times a day (TID) | ORAL | Status: DC | PRN

## 2014-08-22 MED ORDER — ALPRAZOLAM 0.5 MG PO TABS: 0.5000 mg | ORAL_TABLET | Freq: Two times a day (BID) | ORAL | Status: DC | PRN

## 2014-08-22 MED ORDER — STROKE: EARLY STAGES OF RECOVERY BOOK
Freq: Once | Status: AC
  Administered 2014-08-22: 18:00:00
  Filled 2014-08-22: qty 1

## 2014-08-22 MED ORDER — CYCLOBENZAPRINE HCL 10 MG PO TABS: 10.0000 mg | ORAL_TABLET | Freq: Every day | ORAL | Status: DC | PRN

## 2014-08-22 MED ORDER — ADULT MULTIVITAMIN W/MINERALS CH
1.0000 | ORAL_TABLET | Freq: Every day | ORAL | Status: DC
  Administered 2014-08-22 – 2014-08-23 (×2): 1 via ORAL
  Filled 2014-08-22 (×2): qty 1

## 2014-08-22 MED ORDER — ACETAMINOPHEN 325 MG PO TABS: 650.0000 mg | ORAL_TABLET | ORAL | Status: DC | PRN

## 2014-08-22 MED ORDER — TAMSULOSIN HCL 0.4 MG PO CAPS
0.4000 mg | ORAL_CAPSULE | Freq: Every day | ORAL | Status: DC
  Administered 2014-08-23: 0.4 mg via ORAL
  Filled 2014-08-22 (×2): qty 1

## 2014-08-22 MED ORDER — NAPHAZOLINE HCL 0.1 % OP SOLN
1.0000 [drp] | Freq: Four times a day (QID) | OPHTHALMIC | Status: DC | PRN
  Filled 2014-08-22: qty 15

## 2014-08-22 MED ORDER — SALINE SPRAY 0.65 % NA SOLN
1.0000 | NASAL | Status: DC | PRN
  Filled 2014-08-22: qty 44

## 2014-08-22 MED ORDER — LORAZEPAM 2 MG/ML IJ SOLN: 1.0000 mg | Freq: Four times a day (QID) | INTRAMUSCULAR | Status: DC | PRN

## 2014-08-22 MED ORDER — ASPIRIN EC 81 MG PO TBEC
81.0000 mg | DELAYED_RELEASE_TABLET | Freq: Every day | ORAL | Status: DC
  Administered 2014-08-22: 81 mg via ORAL
  Filled 2014-08-22: qty 1

## 2014-08-22 MED ORDER — THIAMINE HCL 100 MG/ML IJ SOLN
100.0000 mg | Freq: Every day | INTRAMUSCULAR | Status: DC
  Administered 2014-08-23: 100 mg via INTRAVENOUS
  Filled 2014-08-22: qty 2

## 2014-08-22 NOTE — ED Notes (Signed)
Per pt family sts woke up this am with confusion. Pt has left facial droop and grip weaker on the left side. Pt confused to month and year. sts also he hasnt been feeling well the last 2 days.

## 2014-08-22 NOTE — ED Notes (Signed)
Neuro at bedside.

## 2014-08-22 NOTE — Progress Notes (Signed)
Pt arrived to 4N19 via stretcher.  Pt welcomed and oriented to the room.  Pt ambulated from stretcher to bed without difficulty.  Telemetry monitor applied. Call bell within reach.  Will continue to monitor. Cori Razor, RN

## 2014-08-22 NOTE — ED Provider Notes (Addendum)
CSN: [id number]     Arrival date & time 08/22/14  7846 History   First MD Initiated Contact with Patient 08/22/14 [id number]     Chief Complaint  Patient presents with  . Cerebrovascular Accident     (Consider location/radiation/quality/duration/timing/severity/associated sxs/prior Treatment) Patient is a 68 y.o. male presenting with Acute Neurological Problem. The history is provided by the patient and the spouse.  Cerebrovascular Accident    Joseph Foster is a 68 y.o. male who is here for evaluation of "confusion and agitation".  His wife states that he got out of bed at 6:30 AM, this morning.  She got out of bed at 8:00 and found him in his confused state.  She describes the confusion as being related to not remembering things such as who played football yesterday, upcoming appointments, and also noticed that he has some drooping of his left mouth and left eye.  He also feels like he was "agitated and combative".  The patient and his wife have been arguing about him getting a motorcycle for the last several days.  During this time he has been somewhat agitated.  Patient has chronic pain, depression, and is not employed.  He has never had cardiac or stroke problems.  He is taking his usual medications.  There are no other known modifying factors.   Past Medical History  Diagnosis Date  . DDD (degenerative disc disease), lumbar L4  . Chronic back pain   . Arthritis KNEES  . Acute medial meniscus tear of left knee   . Sleep apnea   . Depression   . COPD, mild    Past Surgical History  Procedure Laterality Date  . Spinal cord stimulator implant  2009  . Right knee surg.  2005  . Knee arthroscopy  01/15/2012    Procedure: ARTHROSCOPY KNEE;  Surgeon: Gearlean Alf, MD;  Location: [facility];  Service: Orthopedics;  Laterality: Left;  meniscal debridement  . Tonsilectomy/adenoidectomy with myringotomy    . Vasectomy     Family History  Problem Relation Age of Onset   . Heart disease Father   . Leukemia Mother   . Hyperlipidemia Mother    History  Substance Use Topics  . Smoking status: Never Smoker   . Smokeless tobacco: Never Used  . Alcohol Use: 5.4 oz/week    6 Cans of beer, 3 Shots of liquor per week    Review of Systems  All other systems reviewed and are negative.     Allergies  Ceclor and Penicillins  Home Medications   Prior to Admission medications   Medication Sig Start Date End Date Taking? Authorizing Provider  ALPRAZolam Duanne Moron) 0.5 MG tablet Take 0.5 mg by mouth 2 (two) times daily as needed for anxiety.   Yes Historical Provider, MD  aspirin EC 81 MG tablet Take 81 mg by mouth daily.   Yes Historical Provider, MD  cyclobenzaprine (FLEXERIL) 10 MG tablet Take 10 mg by mouth daily as needed for muscle spasms.    Yes Historical Provider, MD  morphine (MS CONTIN) 30 MG 12 hr tablet Take 30 mg by mouth every 12 (twelve) hours.   Yes Historical Provider, MD  Multiple Vitamin (MULTIVITAMIN) tablet Take 1 tablet by mouth daily.   Yes Historical Provider, MD  PARoxetine (PAXIL) 10 MG tablet Take 10 mg by mouth every morning.   Yes Historical Provider, MD  sodium chloride (OCEAN) 0.65 % SOLN nasal spray Place 1 spray into both nostrils as needed for  congestion.   Yes Historical Provider, MD  tamsulosin (FLOMAX) 0.4 MG CAPS capsule Take 0.4 mg by mouth daily.   Yes Historical Provider, MD   BP 143/93 mmHg  Pulse 70  Temp(Src) 98.1 F (36.7 C) (Oral)  Resp 14  Ht 6\' 2"  (1.88 m)  Wt 261 lb (118.389 kg)  BMI 33.50 kg/m2  SpO2 99% Physical Exam  Constitutional: He is oriented to person, place, and time. He appears well-developed and well-nourished. No distress.  HENT:  Head: Normocephalic and atraumatic.  Right Ear: External ear normal.  Left Ear: External ear normal.  Eyes: Conjunctivae and EOM are normal. Pupils are equal, round, and reactive to light.  Neck: Normal range of motion and phonation normal. Neck supple.   Cardiovascular: Normal rate, regular rhythm and normal heart sounds.   Pulmonary/Chest: Effort normal and breath sounds normal. He exhibits no bony tenderness.  Abdominal: Soft. There is no tenderness.  Musculoskeletal: Normal range of motion.  Neurological: He is alert and oriented to person, place, and time. No cranial nerve deficit or sensory deficit. He exhibits normal muscle tone. Coordination normal.  No dysarthria, aphasia or nystagmus.  Normal finger-to-nose and heel-to-shin, bilaterally.  Strength 5 out of 5; arms and legs bilaterally  Skin: Skin is warm, dry and intact.  Psychiatric: He has a normal mood and affect. His behavior is normal. Judgment and thought content normal.  Nursing note and vitals reviewed.   ED Course  Procedures (including critical care time)  10:15- Evaluation for possible thrombolysis- symptoms onset time is not clear.  Therefore, thrombolysis is not a consideration for treatment, at this time.  Medications - No data to display  Patient Vitals for the past 24 hrs:  BP Temp Temp src Pulse Resp SpO2 Height Weight  08/22/14 1408 143/93 mmHg - - 70 - 99 % - -  08/22/14 1345 136/85 mmHg - - 69 14 100 % - -  08/22/14 1330 153/90 mmHg - - 72 11 100 % - -  08/22/14 1315 140/84 mmHg - - 74 18 100 % - -  08/22/14 1300 142/78 mmHg - - 70 17 100 % - -  08/22/14 1245 125/84 mmHg - - 67 18 99 % - -  08/22/14 1230 132/82 mmHg - - 69 12 100 % - -  08/22/14 1222 136/85 mmHg - - 72 14 100 % - -  08/22/14 1215 136/85 mmHg - - 72 22 100 % - -  08/22/14 1200 125/88 mmHg - - 69 11 100 % - -  08/22/14 1145 132/83 mmHg - - 68 16 100 % - -  08/22/14 1130 131/86 mmHg - - 68 14 100 % - -  08/22/14 1120 - 98.1 F (36.7 C) - - - - - -  08/22/14 1115 132/75 mmHg - - 76 18 100 % - -  08/22/14 1100 131/79 mmHg - - 79 11 99 % - -  08/22/14 1046 125/83 mmHg - - 76 16 100 % - -  08/22/14 1015 133/82 mmHg - - 77 19 99 % - -  08/22/14 1000 148/93 mmHg - - 90 18 100 % - -   08/22/14 0945 146/90 mmHg - - 80 16 100 % - -  08/22/14 0919 152/95 mmHg 97.9 F (36.6 C) Oral 90 18 100 % 6\' 2"  (1.88 m) 261 lb (118.389 kg)    12:45 Reevaluation with update and discussion. After initial assessment and treatment, an updated evaluation reveals no change in status. Joseph Foster L  12:50- Consult NeuroHospitalist- will see in ED for evaluation.  Seen by neuro hospitalist, and they recommend admission for TIA evaluation Labs Review Labs Reviewed  APTT - Abnormal; Notable for the following:    aPTT 38 (*)    All other components within normal limits  COMPREHENSIVE METABOLIC PANEL - Abnormal; Notable for the following:    Glucose, Bld 112 (*)    BUN 24 (*)    Alkaline Phosphatase 29 (*)    Total Bilirubin <0.2 (*)    GFR calc non Af Amer 73 (*)    GFR calc Af Amer 84 (*)    All other components within normal limits  CBG MONITORING, ED - Abnormal; Notable for the following:    Glucose-Capillary 114 (*)    All other components within normal limits  PROTIME-INR  CBC  DIFFERENTIAL  I-STAT TROPOININ, ED    Imaging Review Ct Head (brain) Wo Contrast  08/22/2014   CLINICAL DATA:  Awoke this morning with confusion, slurred speech, LEFT facial droop and LEFT-sided weakness, has not felt well for past 2 days, cerebrovascular accident, past history of COPD and sleep apnea  EXAM: CT HEAD WITHOUT CONTRAST  TECHNIQUE: Contiguous axial images were obtained from the base of the skull through the vertex without intravenous contrast.  COMPARISON:  None  FINDINGS: Normal ventricular morphology.  Mild age-related atrophy.  No midline shift or mass effect.  Otherwise normal appearance of brain parenchyma.  No intracranial hemorrhage, mass lesion or evidence acute infarction.  No extra-axial fluid collections.  No significant sinus or osseous abnormalities.  IMPRESSION: Mild age-related atrophy.  No acute intracranial abnormalities.   Electronically Signed   By: Lavonia Dana M.D.   On:  08/22/2014 10:49     EKG Interpretation   Date/Time:  Monday August 22 2014 09:14:58 EST Ventricular Rate:  93 PR Interval:  172 QRS Duration: 94 QT Interval:  352 QTC Calculation: 437 R Axis:   -42 Text Interpretation:  Normal sinus rhythm Left axis deviation Abnormal ECG  No old tracing to compare Confirmed by [facility]  MD, Aubriella Perezgarcia [id number]) on  08/22/2014 3:56:47 PM      MDM   Final diagnoses:  Confusion  Agitation     Nonspecific confusion and agitation, possibly consistent with TIA.  No evidence for acute CVA, MRI cannot be done because of his spinal nerve stimulator which is present.  Nursing Notes Reviewed/ Care Coordinated, and agree without changes. Applicable Imaging Reviewed.  Interpretation of Laboratory Data incorporated into ED treatment  Name: Admit to hospitalist service, with neurology consultation    Richarda Blade, MD 08/22/14 [place], MD 08/22/14 [phone]

## 2014-08-22 NOTE — Consult Note (Addendum)
Referring Physician: Eulis Foster    Chief Complaint: TIA versus TGA  HPI:                                                                                                                                         Joseph Foster is an 68 y.o. male who was last seen normal prior to going to bed last night.  This AM wife noted he was not acting normal.  She noted he was very confused, asking the same question over and over multiple times.  He was unsure of his location, year and month, was unable to state what he ate last night.  At one point he noted he forgot to take his medications but when he went up to his house to open the door he forgot why he was going into the house.  He currently is back to his baseline. Wife noted he had a left facial droop which she states has improved but not fully resolved.   Date last known well: Date: 08/21/2014 Time last known well: Unable to determine tPA Given: No: out of window and symptoms resolved.   Past Medical History  Diagnosis Date  . DDD (degenerative disc disease), lumbar L4  . Chronic back pain   . Arthritis KNEES  . Acute medial meniscus tear of left knee   . Sleep apnea   . Depression   . COPD, mild     Past Surgical History  Procedure Laterality Date  . Spinal cord stimulator implant  2009  . Right knee surg.  2005  . Knee arthroscopy  01/15/2012    Procedure: ARTHROSCOPY KNEE;  Surgeon: Gearlean Alf, MD;  Location: [facility];  Service: Orthopedics;  Laterality: Left;  meniscal debridement  . Tonsilectomy/adenoidectomy with myringotomy    . Vasectomy      Family History  Problem Relation Age of Onset  . Heart disease Father   . Leukemia Mother   . Hyperlipidemia Mother    Social History:  reports that he has never smoked. He has never used smokeless tobacco. He reports that he drinks about 5.4 oz of alcohol per week. He reports that he does not use illicit drugs.  Allergies:  Allergies  Allergen Reactions  . Ceclor  [Cefaclor] Itching  . Penicillins Hives    Medications:  No current facility-administered medications for this encounter.   Current Outpatient Prescriptions  Medication Sig Dispense Refill  . ALPRAZolam (XANAX) 0.5 MG tablet Take 0.5 mg by mouth 2 (two) times daily as needed for anxiety.    [name] aspirin EC 81 MG tablet Take 81 mg by mouth daily.    . cyclobenzaprine (FLEXERIL) 10 MG tablet Take 10 mg by mouth daily as needed for muscle spasms.     [name] morphine (MS CONTIN) 30 MG 12 hr tablet Take 30 mg by mouth every 12 (twelve) hours.    . Multiple Vitamin (MULTIVITAMIN) tablet Take 1 tablet by mouth daily.    [name] PARoxetine (PAXIL) 10 MG tablet Take 10 mg by mouth every morning.    . sodium chloride (OCEAN) 0.65 % SOLN nasal spray Place 1 spray into both nostrils as needed for congestion.    . tamsulosin (FLOMAX) 0.4 MG CAPS capsule Take 0.4 mg by mouth daily.       ROS:                                                                                                                                       History obtained from the patient  General ROS: negative for - chills, fatigue, fever, night sweats, weight gain or weight loss Psychological ROS: negative for - behavioral disorder, hallucinations, memory difficulties, mood swings or suicidal ideation Ophthalmic ROS: negative for - blurry vision, double vision, eye pain or loss of vision ENT ROS: negative for - epistaxis, nasal discharge, oral lesions, sore throat, tinnitus or vertigo Allergy and Immunology ROS: negative for - hives or itchy/watery eyes Hematological and Lymphatic ROS: negative for - bleeding problems, bruising or swollen lymph nodes Endocrine ROS: negative for - galactorrhea, hair pattern changes, polydipsia/polyuria or temperature intolerance Respiratory ROS: negative for - cough, hemoptysis, shortness of  breath or wheezing Cardiovascular ROS: negative for - chest pain, dyspnea on exertion, edema or irregular heartbeat Gastrointestinal ROS: negative for - abdominal pain, diarrhea, hematemesis, nausea/vomiting or stool incontinence Genito-Urinary ROS: negative for - dysuria, hematuria, incontinence or urinary frequency/urgency Musculoskeletal ROS: negative for - joint swelling or muscular weakness Neurological ROS: as noted in HPI Dermatological ROS: negative for rash and skin lesion changes  Neurologic Examination:                                                                                                      Blood pressure 142/78, pulse 70, temperature 98.1 F (36.7 C), temperature source Oral,  resp. rate 17, height 6\' 2"  (1.88 m), weight 118.389 kg (261 lb), SpO2 100 %.   Physical Exam  Constitutional: He appears well-developed and well-nourished.  Psych: Affect appropriate to situation Eyes: No scleral injection HENT: No OP obstrucion Head: Normocephalic.  Cardiovascular: Normal rate and regular rhythm.  Respiratory: Effort normal and breath sounds normal.  GI: Soft. Bowel sounds are normal. No distension. There is no tenderness.  Skin: WDI  Neuro Exam: Mental Status: Alert, oriented, thought content appropriate.  Speech fluent without evidence of aphasia.  Able to follow 3 step commands without difficulty. Cranial Nerves: II: Discs flat bilaterally; Visual fields grossly normal, pupils equal, round, reactive to light and accommodation III,IV, VI: ptosis not present, extra-ocular motions intact bilaterally V,VII: smile symmetric at rest has a slight left NL fold decrease, facial light touch sensation normal bilaterally VIII: hearing normal bilaterally IX,X: gag reflex present XI: bilateral shoulder shrug XII: midline tongue extension without atrophy or fasciculations  Motor: Right : Upper extremity   5/5    Left:     Upper extremity   5/5  Lower extremity   5/5     Lower  extremity   5/5 Tone and bulk:normal tone throughout; no atrophy noted Sensory: Pinprick and light touch intact throughout, bilaterally Deep Tendon Reflexes:  Right: Upper Extremity   Left: Upper extremity   biceps (C-5 to C-6) 2/4   biceps (C-5 to C-6) 2/4 tricep (C7) 2/4    triceps (C7) 2/4 Brachioradialis (C6) 2/4  Brachioradialis (C6) 2/4  Lower Extremity Lower Extremity  quadriceps (L-2 to L-4) 2/4   quadriceps (L-2 to L-4) 2/4 Achilles (S1) 2/4   Achilles (S1) 2/4  Plantars: Right: downgoing   Left: downgoing Cerebellar: normal finger-to-nose,  normal heel-to-shin test Gait: not tested.  CV: pulses palpable throughout    Lab Results: Basic Metabolic Panel:  Recent Labs Lab 08/22/14 1009  NA 138  K 4.9  CL 102  CO2 24  GLUCOSE 112*  BUN 24*  CREATININE 1.04  CALCIUM 8.9    Liver Function Tests:  Recent Labs Lab 08/22/14 1009  AST 18  ALT 16  ALKPHOS 29*  BILITOT <0.2*  PROT 7.2  ALBUMIN 3.5   No results for input(s): LIPASE, AMYLASE in the last 168 hours. No results for input(s): AMMONIA in the last 168 hours.  CBC:  Recent Labs Lab 08/22/14 1009  WBC 5.9  NEUTROABS 3.2  HGB 13.8  HCT 40.7  MCV 90.6  PLT 199    Cardiac Enzymes: No results for input(s): CKTOTAL, CKMB, CKMBINDEX, TROPONINI in the last 168 hours.  Lipid Panel: No results for input(s): CHOL, TRIG, HDL, CHOLHDL, VLDL, LDLCALC in the last 168 hours.  CBG:  Recent Labs Lab 08/22/14 0940  GLUCAP 114*    Microbiology: No results found for this or any previous visit.  Coagulation Studies:  Recent Labs  08/22/14 1009  LABPROT 14.3  INR 1.10    Imaging: Ct Head (brain) Wo Contrast  08/22/2014   CLINICAL DATA:  Awoke this morning with confusion, slurred speech, LEFT facial droop and LEFT-sided weakness, has not felt well for past 2 days, cerebrovascular accident, past history of COPD and sleep apnea  EXAM: CT HEAD WITHOUT CONTRAST  TECHNIQUE: Contiguous axial  images were obtained from the base of the skull through the vertex without intravenous contrast.  COMPARISON:  None  FINDINGS: Normal ventricular morphology.  Mild age-related atrophy.  No midline shift or mass effect.  Otherwise normal appearance of brain  parenchyma.  No intracranial hemorrhage, mass lesion or evidence acute infarction.  No extra-axial fluid collections.  No significant sinus or osseous abnormalities.  IMPRESSION: Mild age-related atrophy.  No acute intracranial abnormalities.   Electronically Signed   By: Lavonia Dana M.D.   On: 08/22/2014 10:49       Assessment and plan discussed with with attending physician and they are in agreement.    Etta Quill PA-C [facility] [phone]  08/22/2014, 1:18 PM   Assessment: 68 y.o. male with transient confusion and amnestic period associated with left facial droop.  Initial history consistent with TGA however associated left facial weakness is more suggestive with right brain TIA.  At this point he has resolved but would benefit from full TIA work up. He cannot have MRI due to spinal cord stimulator.   Stroke Risk Factors - none   1. HgbA1c, fasting lipid panel 2. repeat CT of head in AM 3. PT consult, OT consult, Speech consult 4. Echocardiogram 5. Carotid dopplers 6. Prophylactic therapy-Antiplatelet med: Aspirin - dose 81 MG daily 7. Risk factor modification 8. Telemetry monitoring 9. Frequent neuro checks 10. EEG   Patient seen and examined together with physician assistant and I concur with the assessment and plan.  Dorian Pod, MD

## 2014-08-22 NOTE — H&P (Signed)
[facility] History and Physical                                                                                    Patient Demographics  Joseph Foster, is a 68 y.o. male  MRN: [id number]   DOB - 23-Jul-1946  Admit Date - 08/22/2014  Outpatient Primary MD for the patient is Gennette Pac, MD   With History of -  Past Medical History  Diagnosis Date  . DDD (degenerative disc disease), lumbar L4  . Chronic back pain   . Arthritis KNEES  . Acute medial meniscus tear of left knee   . Sleep apnea   . Depression   . COPD, mild       Past Surgical History  Procedure Laterality Date  . Spinal cord stimulator implant  2009  . Right knee surg.  2005  . Knee arthroscopy  01/15/2012    Procedure: ARTHROSCOPY KNEE;  Surgeon: Gearlean Alf, MD;  Location: [facility];  Service: Orthopedics;  Laterality: Left;  meniscal debridement  . Tonsilectomy/adenoidectomy with myringotomy    . Vasectomy      in for   Chief Complaint  Patient presents with  . Cerebrovascular Accident     HPI  Joseph Foster  is a 68 y.o. male with a past medical history of anxiety, depression, Lumbar disc degeneration with a spinal cord stimulator, tobacco use, alcohol use, and BPH presents to the ED for agitation and confusion. According to the patient he awoke around 6 am this morning confused. He could not remember the month/day/recent events. His wife awoke around 8 am and noticed that he was not acting like himself and noticed ptosis of his left eye and drooping of the left side of this mouth.  According to the patient this confusion lasted about 1-1.5 hours and has since resolved. The only new symptom is a red and watery left eye. He has no cardiovascular history but does take a 81 mg ASA every day as recommended by his PCP for "men his age". He has no family history of stroke.   According to the patient he recently was trying to ween himself off of his PRN Morphine that he takes for  his back pain. He has continued to take his long acting morphine dosage and has been taking Librium up until two days ago for this transition. He does not wish to receive any more pain medication in the hospital than his usual dose of long acting morphine. Patient states that he take the morphine for back and knee pain and has done so for years. He also states that he has recently switch from Paxil to [place] for his depression and anxiety. Additionally he takes Xanex every night for anxiety. He also takes medication for his BPH and states that since he has been taking the Librium that he has had difficulty urinating. He denies any urinary symptoms.   The patient states that he is active and walks about 2 miles per day with his dog. He admits to smoking vaporized tobacco every day as well as drinking a 1/5th of whiskey every week. He does not use recreational drugs.  Review of Systems    In addition to the HPI above,  No Fever-chills, No Headache, No changes with Vision or hearing, No problems swallowing food or Liquids, No Chest pain, Cough or Shortness of Breath, No Abdominal pain, No Nausea or Vomiting, Bowel movements are regular, No Blood in stool or Urine, No dysuria, No new skin rashes or bruises, No new joints pains-aches,  No new weakness, tingling, numbness in any extremity, No recent weight gain or loss, No polyuria, polydypsia or polyphagia, No significant Mental Stressors.  A full 10 point Review of Systems was done, except as stated above, all other Review of Systems were negative.   Social History History  Substance Use Topics  . Smoking status: Never Smoker   . Smokeless tobacco: Never Used  . Alcohol Use: 5.4 oz/week    6 Cans of beer, 3 Shots of liquor per week     Family History Family History  Problem Relation Age of Onset  . Heart disease Father   . Leukemia Mother   . Hyperlipidemia Mother      Prior to Admission medications   Medication Sig Start  Date End Date Taking? Authorizing Provider  ALPRAZolam Duanne Moron) 0.5 MG tablet Take 0.5 mg by mouth 2 (two) times daily as needed for anxiety.   Yes Historical Provider, MD  aspirin EC 81 MG tablet Take 81 mg by mouth daily.   Yes Historical Provider, MD  cyclobenzaprine (FLEXERIL) 10 MG tablet Take 10 mg by mouth daily as needed for muscle spasms.    Yes Historical Provider, MD  morphine (MS CONTIN) 30 MG 12 hr tablet Take 30 mg by mouth every 12 (twelve) hours.   Yes Historical Provider, MD  Multiple Vitamin (MULTIVITAMIN) tablet Take 1 tablet by mouth daily.   Yes Historical Provider, MD  PARoxetine (PAXIL) 10 MG tablet Take 10 mg by mouth every morning.   Yes Historical Provider, MD  sodium chloride (OCEAN) 0.65 % SOLN nasal spray Place 1 spray into both nostrils as needed for congestion.   Yes Historical Provider, MD  tamsulosin (FLOMAX) 0.4 MG CAPS capsule Take 0.4 mg by mouth daily.   Yes Historical Provider, MD    Allergies  Allergen Reactions  . Ceclor [Cefaclor] Itching  . Penicillins Hives    Physical Exam  Vitals  Blood pressure 130/86, pulse 75, temperature 98.1 F (36.7 C), temperature source Oral, resp. rate 13, height 6\' 2"  (1.88 m), weight 118.389 kg (261 lb), SpO2 96 %.   General:  Lying in bed in NAD, Patient is playing Solitaire on his cell phone, Patient is AAOx3.   Psych:  Normal affect and insight, Not Suicidal or Homicidal  Neuro:   No F.N deficits, ALL C.Nerves Intact, Strength 5/5 all 4 extremities, Sensation intact all 4 extremities. Possible Left sided facial droop.   ENT:  Ears and Eyes appear Normal, Left Conjunctivae pink with clear drainage, PERRLA. Moist Oral Mucosa.  Neck:  Supple Neck, No JVD, No cervical lymphadenopathy appreciated  Respiratory:  Symmetrical Chest wall movement, Good air movement bilaterally, CTAB.  Cardiac:  RRR, No Gallops, Rubs or Murmurs, No Parasternal Heave.  Abdomen:  Positive Bowel Sounds, Abdomen Soft, Non tender, No  organomegaly appreciated  Skin:  No Cyanosis, Normal Skin Turgor, No Skin Rash or Bruise. Multiple small scabs on lower legs.    Extremities:  Good muscle tone,  joints appear normal , no effusions, Normal ROM.   Data Review  CBC  Recent Labs Lab 08/22/14 1009  WBC 5.9  HGB 13.8  HCT 40.7  PLT 199  MCV 90.6  MCH 30.7  MCHC 33.9  RDW 13.1  LYMPHSABS 2.0  MONOABS 0.5  EOSABS 0.3  BASOSABS 0.1   ------------------------------------------------------------------------------------------------------------------  Chemistries   Recent Labs Lab 08/22/14 1009  NA 138  K 4.9  CL 102  CO2 24  GLUCOSE 112*  BUN 24*  CREATININE 1.04  CALCIUM 8.9  AST 18  ALT 16  ALKPHOS 29*  BILITOT <0.2*     Coagulation profile  Recent Labs Lab 08/22/14 1009  INR 1.10    Imaging results:   Ct Head (brain) Wo Contrast  08/22/2014   CLINICAL DATA:  Awoke this morning with confusion, slurred speech, LEFT facial droop and LEFT-sided weakness, has not felt well for past 2 days, cerebrovascular accident, past history of COPD and sleep apnea  EXAM: CT HEAD WITHOUT CONTRAST  TECHNIQUE: Contiguous axial images were obtained from the base of the skull through the vertex without intravenous contrast.  COMPARISON:  None  FINDINGS: Normal ventricular morphology.  Mild age-related atrophy.  No midline shift or mass effect.  Otherwise normal appearance of brain parenchyma.  No intracranial hemorrhage, mass lesion or evidence acute infarction.  No extra-axial fluid collections.  No significant sinus or osseous abnormalities.  IMPRESSION: Mild age-related atrophy.  No acute intracranial abnormalities.   Electronically Signed   By: Lavonia Dana M.D.   On: 08/22/2014 10:49    My personal review of EKG: Rhythm NSR with Left axis deviation, Rate  93/min, QTc 473 no Acute ST changes    Assessment & Plan   Patient Active Problem List   Diagnosis Date Noted  . TIA (transient ischemic attack)  08/22/2014  . Tobacco abuse 08/22/2014  . Alcohol abuse 08/22/2014  . Chronic pain 08/22/2014  . Depression 08/22/2014  . Confusion   . Numbness 04/19/2013  . Acute medial meniscal tear 01/15/2012   TIA/Confusion:  - Uncertain etiology but has a history of alcohol and tobacco abuse - CT brain without acute changes. PT could not have MRI of brain due to spinal cord stimulator.  - Seen by neuro and consultation, ASA 81 Daily for now. - Admitted under TIA protocol - Repeat CT tomorrow   Tobacco Use: - Chronic. Counseled - Nicotine patch ordered   Alcohol Abuse: - CIWA protocol- no history of DTs, counseled  Chronic Pain: - Patient stopped Librium 2 days ago. He had been trying to wean off of his PRN/short acting morphine.   Depression: - Chronic. Stable. Patient states that he recently switched from Paxil to [place]. Wife will bring medications with her so will verify this.  - Continue home meds.       DVT Prophylaxis Loveonx  AM Labs Ordered, also please review Full Orders  Family Communication:   None. Patient alert and agreeable to plan of care   Code Status:  Full  Likely DC to home when medically appropriate  Condition:  guarded  Time spent in minutes : Bradley, Sarah M PA-S on 08/22/2014 at 2:52 PM  Lala Lund K M.D on 08/22/2014 at 3:06 PM  Between 7am to 7pm - Pager - [phone], After 7pm go to [email or web address] - password [email or web address]  And look for the night coverage person covering me after hours  [facility]  Office  [phone]

## 2014-08-22 NOTE — ED Notes (Signed)
CBG is 114. Notified Nurse Raquel Sarna.

## 2014-08-22 NOTE — ED Notes (Signed)
Hospitalist at bedside. Ambulated to restroom with steady gait

## 2014-08-22 NOTE — ED Notes (Signed)
Patient transported to CT 

## 2014-08-23 ENCOUNTER — Observation Stay (HOSPITAL_COMMUNITY): Payer: BC Managed Care – PPO

## 2014-08-23 ENCOUNTER — Encounter (HOSPITAL_COMMUNITY): Payer: Self-pay | Admitting: Neurology

## 2014-08-23 DIAGNOSIS — I517 Cardiomegaly: Secondary | ICD-10-CM

## 2014-08-23 DIAGNOSIS — E785 Hyperlipidemia, unspecified: Secondary | ICD-10-CM

## 2014-08-23 DIAGNOSIS — G8929 Other chronic pain: Secondary | ICD-10-CM

## 2014-08-23 DIAGNOSIS — Z720 Tobacco use: Secondary | ICD-10-CM

## 2014-08-23 DIAGNOSIS — G458 Other transient cerebral ischemic attacks and related syndromes: Secondary | ICD-10-CM

## 2014-08-23 DIAGNOSIS — R451 Restlessness and agitation: Secondary | ICD-10-CM | POA: Insufficient documentation

## 2014-08-23 DIAGNOSIS — G459 Transient cerebral ischemic attack, unspecified: Principal | ICD-10-CM

## 2014-08-23 HISTORY — PX: HC EEG 41-60MINS: 74000013

## 2014-08-23 LAB — GLUCOSE, CAPILLARY
Glucose-Capillary: 90 mg/dL (ref 70–99)
Glucose-Capillary: 103 mg/dL — ABNORMAL HIGH (ref 70–99)

## 2014-08-23 LAB — LIPID PANEL
Cholesterol: 165 mg/dL (ref 0–200)
HDL: 51 mg/dL (ref >39)
LDL Cholesterol: 88 mg/dL (ref 0–99)
Total CHOL/HDL Ratio: 3.2 RATIO
Triglycerides: 131 mg/dL (ref <150)
VLDL: 26 mg/dL (ref 0–40)

## 2014-08-23 MED ORDER — SIMVASTATIN 20 MG PO TABS: 20.0000 mg | ORAL_TABLET | Freq: Every day | ORAL | Status: DC

## 2014-08-23 MED ORDER — ASPIRIN EC 325 MG PO TBEC: 325.0000 mg | DELAYED_RELEASE_TABLET | Freq: Every day | ORAL | Status: DC

## 2014-08-23 MED ORDER — ASPIRIN EC 325 MG PO TBEC
325.0000 mg | DELAYED_RELEASE_TABLET | Freq: Every day | ORAL | Status: DC
  Filled 2014-08-23: qty 1

## 2014-08-23 NOTE — Progress Notes (Signed)
Discharge orders received, pt for discharge home today,  IV D/C.  D/C instructions and Rx given with verbalized understanding.  Family at bedside to assist pt with discharge. Staff brought pt downstairs via wheelchair.

## 2014-08-23 NOTE — Progress Notes (Signed)
PATIENT DETAILS Name: Joseph Foster Age: 68 y.o. Sex: male Date of Birth: Dec 16, 1945 Admit Date: 08/22/2014 Admitting Physician Thurnell Lose, MD EXB:[id number] Marigene Ehlers, MD  Subjective: No major complaints, back to baseline since admission. No further episodes of confusion or speech difficulties.  Assessment/Plan: Principal Problem:   Suspected TIA (transient ischemic attack):admitted with confusion, and asking same questions multiple times. Was thought to have a left facial droop as well. Confusion, and left facial droop resolved yesterday at time of admit. Suspected TIA,admitted for further work.CT head negative (unable to do MRI as has spinal cord stimulator), Echo pending, Carotid Doppler neg for significant stenosis. LDL 88, start Statins. A1C at 5.6. On ASA 81 mg prior to admission, will increase to ASA 325 mg on discharge.  Active Problems:   Tobacco Abuse:counseled.    Chronic Pain synd:continue with Narcotics.Spinal Cord Stimulator in place.     Depression/Anxiety:stable. Continue home meds.  Disposition: Remain inpatient  Antibiotics: Anti-infectives    None     DVT Prophylaxis: Prophylactic Lovenox   Code Status: Full code   Family Communication None at bedside-but patient awake/alert/understands above plan  Procedures:  None  CONSULTS:  neurology  MEDICATIONS: Scheduled Meds: . [START ON 08/24/2014] aspirin EC  325 mg Oral Daily  . docusate sodium  100 mg Oral BID  . enoxaparin (LOVENOX) injection  40 mg Subcutaneous Q24H  . folic acid  1 mg Oral Daily  . morphine  30 mg Oral Q12H  . multivitamin with minerals  1 tablet Oral Daily  . nicotine  14 mg Transdermal Q24H  . simvastatin  20 mg Oral q1800  . tamsulosin  0.4 mg Oral Daily  . thiamine  100 mg Oral Daily   Or  . thiamine  100 mg Intravenous Daily   Continuous Infusions:  PRN Meds:.acetaminophen, cyclobenzaprine, LORazepam **OR** LORazepam, naphazoline, ondansetron, sodium  chloride    PHYSICAL EXAM: Vital signs in last 24 hours: Filed Vitals:   08/22/14 2100 08/22/14 2300 08/23/14 0100 08/23/14 0300  BP: 138/86 138/90 135/85 132/84  Pulse: 67 78 64 66  Temp: 98.4 F (36.9 C) 98.1 F (36.7 C) 98.1 F (36.7 C) 98.2 F (36.8 C)  TempSrc: Oral Oral Oral Oral  Resp: 16 16 14 14   Height:      Weight:      SpO2: 97% 99% 97% 98%    Weight change:  Filed Weights   08/22/14 0919  Weight: 118.389 kg (261 lb)   Body mass index is 33.5 kg/(m^2).   Gen Exam: Awake and alert with clear speech.   Neck: Supple, No JVD.   Chest: B/L Clear.   CVS: S1 S2 Regular, no murmurs.  Abdomen: soft, BS +, non tender, non distended.  Extremities: no edema, lower extremities warm to touch. Neurologic: Non Focal.   Skin: No Rash.   Wounds: N/A.    Intake/Output from previous day: No intake or output data in the 24 hours ending 08/23/14 1345   LAB RESULTS: CBC  Recent Labs Lab 08/22/14 1009  WBC 5.9  HGB 13.8  HCT 40.7  PLT 199  MCV 90.6  MCH 30.7  MCHC 33.9  RDW 13.1  LYMPHSABS 2.0  MONOABS 0.5  EOSABS 0.3  BASOSABS 0.1    Chemistries   Recent Labs Lab 08/22/14 1009  NA 138  K 4.9  CL 102  CO2 24  GLUCOSE 112*  BUN 24*  CREATININE 1.04  CALCIUM 8.9  CBG:  Recent Labs Lab 08/22/14 0940 08/22/14 2253 08/23/14 0652 08/23/14 1151  GLUCAP 114* 103* 90 103*    GFR Estimated Creatinine Clearance: 95.6 mL/min (by C-G formula based on Cr of 1.04).  Coagulation profile  Recent Labs Lab 08/22/14 1009  INR 1.10    Cardiac Enzymes No results for input(s): CKMB, TROPONINI, MYOGLOBIN in the last 168 hours.  Invalid input(s): CK  Invalid input(s): POCBNP No results for input(s): DDIMER in the last 72 hours.  Recent Labs  08/22/14 1009  HGBA1C 5.6    Recent Labs  08/23/14 0618  CHOL 165  HDL 51  LDLCALC 88  TRIG 131  CHOLHDL 3.2   No results for input(s): TSH, T4TOTAL, T3FREE, THYROIDAB in the last 72  hours.  Invalid input(s): FREET3 No results for input(s): VITAMINB12, FOLATE, FERRITIN, TIBC, IRON, RETICCTPCT in the last 72 hours. No results for input(s): LIPASE, AMYLASE in the last 72 hours.  Urine Studies No results for input(s): UHGB, CRYS in the last 72 hours.  Invalid input(s): UACOL, UAPR, USPG, UPH, UTP, UGL, UKET, UBIL, UNIT, UROB, ULEU, UEPI, UWBC, URBC, UBAC, CAST, UCOM, BILUA  MICROBIOLOGY: No results found for this or any previous visit (from the past 240 hour(s)).  RADIOLOGY STUDIES/RESULTS: Ct Head Wo Contrast  08/23/2014   CLINICAL DATA:  68 year old male with left facial droop. Symptoms lasted for 2-3 hr. Subsequent encounter.  EXAM: CT HEAD WITHOUT CONTRAST  TECHNIQUE: Contiguous axial images were obtained from the base of the skull through the vertex without intravenous contrast.  COMPARISON:  08/22/2014 head CT.  FINDINGS: No intracranial hemorrhage.  The basilar artery appears slightly dense which may be normal rather than representing thrombosis. Overall, no definitive findings of large acute infarct. If infarct is of high clinical concern (particularly posterior fossa infarct) MR may be considered.  No intracranial mass lesion noted on this unenhanced exam.  No hydrocephalus. Partial opacification right sphenoid sinus air cell.  IMPRESSION: No definitive findings of large acute infarct. Please see above discussion.  Partial opacification right sphenoid sinus air cell   Electronically Signed   By: Chauncey Cruel M.D.   On: 08/23/2014 07:07   Ct Head (brain) Wo Contrast  08/22/2014   CLINICAL DATA:  Awoke this morning with confusion, slurred speech, LEFT facial droop and LEFT-sided weakness, has not felt well for past 2 days, cerebrovascular accident, past history of COPD and sleep apnea  EXAM: CT HEAD WITHOUT CONTRAST  TECHNIQUE: Contiguous axial images were obtained from the base of the skull through the vertex without intravenous contrast.  COMPARISON:  None  FINDINGS:  Normal ventricular morphology.  Mild age-related atrophy.  No midline shift or mass effect.  Otherwise normal appearance of brain parenchyma.  No intracranial hemorrhage, mass lesion or evidence acute infarction.  No extra-axial fluid collections.  No significant sinus or osseous abnormalities.  IMPRESSION: Mild age-related atrophy.  No acute intracranial abnormalities.   Electronically Signed   By: Lavonia Dana M.D.   On: 08/22/2014 10:49    Oren Binet, MD  [facility] Pager:[phone]  If 7PM-7AM, please contact night-coverage [email or web address] Password [email or web address] 08/23/2014, 1:45 PM   LOS: 1 day

## 2014-08-23 NOTE — Progress Notes (Signed)
EEG Completed; Results Pending  

## 2014-08-23 NOTE — Progress Notes (Signed)
Echo Lab  2D Echocardiogram completed.  Deng Kemler L Myda Detwiler, RDCS 08/23/2014 11:14 AM

## 2014-08-23 NOTE — Progress Notes (Signed)
STROKE TEAM PROGRESS NOTE   HISTORY Joseph Foster is an 68 y.o. male who was last seen normal prior to going to bed last night 08/21/2014, time unknown. This AM 08/22/2014 wife noted he was not acting normal. She noted he was very confused, asking the same question over and over multiple times. He was unsure of his location, year and month, was unable to state what he ate last night. At one point he noted he forgot to take his medications but when he went up to his house to open the door he forgot why he was going into the house. He currently is back to his baseline. Wife noted he had a left facial droop which she states has improved but not fully resolved. Patient was not administered TPA secondary to delay in arrival. He was admitted for further evaluation and treatment.   SUBJECTIVE (INTERVAL HISTORY) Patient in currently in vascular lab for carotid doppler.  Overall he feels his condition is rapidly improving, back to baseline. He mostly remembers the event from yesterday, somewhat hazy.   OBJECTIVE Temp:  [97.5 F (36.4 C)-98.6 F (37 C)] 98.2 F (36.8 C) (11/24 0300) Pulse Rate:  [64-88] 66 (11/24 0300) Cardiac Rhythm:  [-] Normal sinus rhythm (11/23 2100) Resp:  [11-22] 14 (11/24 0300) BP: (125-159)/(75-95) 132/84 mmHg (11/24 0300) SpO2:  [96 %-100 %] 98 % (11/24 0300)   Recent Labs Lab 08/22/14 0940 08/22/14 2253 08/23/14 0652  GLUCAP 114* 103* 90    Recent Labs Lab 08/22/14 1009  NA 138  K 4.9  CL 102  CO2 24  GLUCOSE 112*  BUN 24*  CREATININE 1.04  CALCIUM 8.9    Recent Labs Lab 08/22/14 1009  AST 18  ALT 16  ALKPHOS 29*  BILITOT <0.2*  PROT 7.2  ALBUMIN 3.5    Recent Labs Lab 08/22/14 1009  WBC 5.9  NEUTROABS 3.2  HGB 13.8  HCT 40.7  MCV 90.6  PLT 199   No results for input(s): CKTOTAL, CKMB, CKMBINDEX, TROPONINI in the last 168 hours.  Recent Labs  08/22/14 1009  LABPROT 14.3  INR 1.10    Recent Labs  08/22/14 1540   COLORURINE YELLOW  LABSPEC 1.014  PHURINE 6.0  GLUCOSEU NEGATIVE  HGBUR SMALL*  BILIRUBINUR NEGATIVE  KETONESUR NEGATIVE  PROTEINUR NEGATIVE  UROBILINOGEN 0.2  NITRITE NEGATIVE  LEUKOCYTESUR NEGATIVE       Component Value Date/Time   CHOL 165 08/23/2014 0618   TRIG 131 08/23/2014 0618   HDL 51 08/23/2014 0618   CHOLHDL 3.2 08/23/2014 0618   VLDL 26 08/23/2014 0618   LDLCALC 88 08/23/2014 0618   Lab Results  Component Value Date   HGBA1C 5.6 08/22/2014      Component Value Date/Time   LABOPIA POSITIVE* 08/22/2014 1540   COCAINSCRNUR NONE DETECTED 08/22/2014 1540   LABBENZ POSITIVE* 08/22/2014 1540   AMPHETMU NONE DETECTED 08/22/2014 1540   THCU NONE DETECTED 08/22/2014 1540   LABBARB NONE DETECTED 08/22/2014 1540    No results for input(s): ETH in the last 168 hours.  Ct Head Wo Contrast  08/23/2014   CLINICAL DATA:  68 year old male with left facial droop. Symptoms lasted for 2-3 hr. Subsequent encounter.  EXAM: CT HEAD WITHOUT CONTRAST  TECHNIQUE: Contiguous axial images were obtained from the base of the skull through the vertex without intravenous contrast.  COMPARISON:  08/22/2014 head CT.  FINDINGS: No intracranial hemorrhage.  The basilar artery appears slightly dense which may be normal rather than representing  thrombosis. Overall, no definitive findings of large acute infarct. If infarct is of high clinical concern (particularly posterior fossa infarct) MR may be considered.  No intracranial mass lesion noted on this unenhanced exam.  No hydrocephalus. Partial opacification right sphenoid sinus air cell.  IMPRESSION: No definitive findings of large acute infarct. Please see above discussion.  Partial opacification right sphenoid sinus air cell   Electronically Signed   By: Chauncey Cruel M.D.   On: 08/23/2014 07:07   Ct Head (brain) Wo Contrast  08/22/2014   CLINICAL DATA:  Awoke this morning with confusion, slurred speech, LEFT facial droop and LEFT-sided weakness,  has not felt well for past 2 days, cerebrovascular accident, past history of COPD and sleep apnea  EXAM: CT HEAD WITHOUT CONTRAST  TECHNIQUE: Contiguous axial images were obtained from the base of the skull through the vertex without intravenous contrast.  COMPARISON:  None  FINDINGS: Normal ventricular morphology.  Mild age-related atrophy.  No midline shift or mass effect.  Otherwise normal appearance of brain parenchyma.  No intracranial hemorrhage, mass lesion or evidence acute infarction.  No extra-axial fluid collections.  No significant sinus or osseous abnormalities.  IMPRESSION: Mild age-related atrophy.  No acute intracranial abnormalities.   Electronically Signed   By: Lavonia Dana M.D.   On: 08/22/2014 10:49     PHYSICAL EXAM Pleasant middle aged caucasian male not in distress.Awake alert. Afebrile. Head is nontraumatic. Neck is supple without bruit. Hearing is normal. Cardiac exam no murmur or gallop. Lungs are clear to auscultation. Distal pulses are well felt. Neurological Exam ;  Awake  Alert oriented x 3. Intact attention, registration and recall. Normal speech and language.eye movements full without nystagmus.fundi were not visualized. Vision acuity and fields appear normal. Hearing is normal. Palatal movements are normal. Face symmetric but mild left nasolabial fold asymmetric on smiling. Tongue midline. Normal strength, tone, reflexes and coordination. Normal sensation. Gait deferred. ASSESSMENT/PLAN [name] is a 68 y.o. male with presenting with confusion. He did not receive IV t-PA due to delay in arrival.   TIA:  right brain  Resultant  Confusion resolved  MRI  / MRA  Unable to have d/t spinal cord stimulator  Repeat CT without acute stroke  Carotid Doppler  pending   2D Echo  pending   HgbA1c 5.6  Lovenox 40 mg sq daily for VTE prophylaxis  Diet Heart thin liquids  aspirin 81 mg orally every day prior to admission, now on aspirin 81 mg orally every  day. Recommend increase to 325 mg daily  Patient counseled to be compliant with his antithrombotic medications  Ongoing aggressive stroke risk factor management  Therapy recommendations:  pending   Disposition:  Anticipate d/c home once testing completed  Follow up Dr. Leonie Man in 2 months. Order written  Hyperlipidemia  Home meds:  No statin  LDL 88, goal < 70  Add low dose statin  Continue statin at discharge  Other Stroke Risk Factors  Advanced age  ETOH use, on CIWA protocol   Obesity, Body mass index is 33.5 kg/(m^2).   Obstructive sleep apnea   Other Active Problems  COPD  Chronic pain, stopped librium 2 days ago. Trying to wean off MSO4  Other Pertinent History  depression  Hospital day # 1  [facility] BIBY, MSN, APRN, ANVP-BC, AGPCNP-BC [facility] Pager: [phone] 08/23/2014 10:47 AM  I have personally examined this patient, reviewed notes, independently viewed imaging studies, participated in medical decision making and plan  of care. I have made any additions or clarifications directly to the above note. Agree with note above. I think he likely had a right brain TIA and workup is. yet pending  Antony Contras, MD Medical Director Mobile Pager: [phone] 08/23/2014 12:20 PM    To contact Stroke Continuity provider, please refer to [email or web address] After hours, contact General Neurology

## 2014-08-23 NOTE — Progress Notes (Signed)
VASCULAR LAB PRELIMINARY  PRELIMINARY  PRELIMINARY  PRELIMINARY  Carotid duplex completed.    Preliminary report:  Bilateral:  1-39% ICA stenosis.  Vertebral artery flow is antegrade.     Dereon Corkery, RVS 08/23/2014, 10:54 AM

## 2014-08-23 NOTE — Discharge Summary (Signed)
PATIENT DETAILS Name: Joseph Foster Age: 68 y.o. Sex: male Date of Birth: [birth year] MRN: [id number]. Admitting Physician: Thurnell Lose, MD AJO:INOMVE,HMCNO Marigene Ehlers, MD  Admit Date: 08/22/2014 Discharge date: 08/23/2014  Recommendations for Outpatient Follow-up:  Suspected TIA-will need aggressive risk factor modification Mildly decreased EF on Echo-please refer to cardiology  PRIMARY DISCHARGE DIAGNOSIS:  Principal Problem:   TIA (transient ischemic attack) Active Problems:   Acute medial meniscal tear   Numbness   Confusion   Tobacco abuse   Alcohol abuse   Chronic pain   Depression   Agitation      PAST MEDICAL HISTORY: Past Medical History  Diagnosis Date  . DDD (degenerative disc disease), lumbar L4  . Chronic back pain   . Arthritis KNEES  . Acute medial meniscus tear of left knee   . Sleep apnea   . Depression   . COPD, mild     DISCHARGE MEDICATIONS: Current Discharge Medication List    START taking these medications   Details  simvastatin (ZOCOR) 20 MG tablet Take 1 tablet (20 mg total) by mouth daily at 6 PM. Qty: 60 tablet, Refills: 0      CONTINUE these medications which have CHANGED   Details  aspirin EC 325 MG tablet Take 1 tablet (325 mg total) by mouth daily. Qty: 60 tablet, Refills: 0      CONTINUE these medications which have NOT CHANGED   Details  ALPRAZolam (XANAX) 0.5 MG tablet Take 0.5 mg by mouth 2 (two) times daily as needed for anxiety.    cyclobenzaprine (FLEXERIL) 10 MG tablet Take 10 mg by mouth daily as needed for muscle spasms.     morphine (MS CONTIN) 30 MG 12 hr tablet Take 30 mg by mouth every 12 (twelve) hours.    Multiple Vitamin (MULTIVITAMIN) tablet Take 1 tablet by mouth daily.    PARoxetine (PAXIL) 10 MG tablet Take 10 mg by mouth every morning.    sodium chloride (OCEAN) 0.65 % SOLN nasal spray Place 1 spray into both nostrils as needed for congestion.    tamsulosin (FLOMAX) 0.4 MG CAPS capsule Take  0.4 mg by mouth daily.        ALLERGIES:   Allergies  Allergen Reactions  . Ceclor [Cefaclor] Itching  . Penicillins Hives    BRIEF HPI:  See H&P, Labs, Consult and Test reports for all details in brief, patient was admitted for ):admitted with confusion, and asking same questions multiple times. Was thought to have a left facial droop as well.  CONSULTATIONS:   neurology  PERTINENT RADIOLOGIC STUDIES: Ct Head Wo Contrast  08/23/2014   CLINICAL DATA:  68 year old male with left facial droop. Symptoms lasted for 2-3 hr. Subsequent encounter.  EXAM: CT HEAD WITHOUT CONTRAST  TECHNIQUE: Contiguous axial images were obtained from the base of the skull through the vertex without intravenous contrast.  COMPARISON:  08/22/2014 head CT.  FINDINGS: No intracranial hemorrhage.  The basilar artery appears slightly dense which may be normal rather than representing thrombosis. Overall, no definitive findings of large acute infarct. If infarct is of high clinical concern (particularly posterior fossa infarct) MR may be considered.  No intracranial mass lesion noted on this unenhanced exam.  No hydrocephalus. Partial opacification right sphenoid sinus air cell.  IMPRESSION: No definitive findings of large acute infarct. Please see above discussion.  Partial opacification right sphenoid sinus air cell   Electronically Signed   By: Chauncey Cruel M.D.   On: 08/23/2014 07:07  Ct Head (brain) Wo Contrast  08/22/2014   CLINICAL DATA:  Awoke this morning with confusion, slurred speech, LEFT facial droop and LEFT-sided weakness, has not felt well for past 2 days, cerebrovascular accident, past history of COPD and sleep apnea  EXAM: CT HEAD WITHOUT CONTRAST  TECHNIQUE: Contiguous axial images were obtained from the base of the skull through the vertex without intravenous contrast.  COMPARISON:  None  FINDINGS: Normal ventricular morphology.  Mild age-related atrophy.  No midline shift or mass effect.  Otherwise  normal appearance of brain parenchyma.  No intracranial hemorrhage, mass lesion or evidence acute infarction.  No extra-axial fluid collections.  No significant sinus or osseous abnormalities.  IMPRESSION: Mild age-related atrophy.  No acute intracranial abnormalities.   Electronically Signed   By: Lavonia Dana M.D.   On: 08/22/2014 10:49     PERTINENT LAB RESULTS: CBC:  Recent Labs  08/22/14 1009  WBC 5.9  HGB 13.8  HCT 40.7  PLT 199   CMET CMP     Component Value Date/Time   NA 138 08/22/2014 1009   K 4.9 08/22/2014 1009   CL 102 08/22/2014 1009   CO2 24 08/22/2014 1009   GLUCOSE 112* 08/22/2014 1009   BUN 24* 08/22/2014 1009   CREATININE 1.04 08/22/2014 1009   CALCIUM 8.9 08/22/2014 1009   PROT 7.2 08/22/2014 1009   ALBUMIN 3.5 08/22/2014 1009   AST 18 08/22/2014 1009   ALT 16 08/22/2014 1009   ALKPHOS 29* 08/22/2014 1009   BILITOT <0.2* 08/22/2014 1009   GFRNONAA 73* 08/22/2014 1009   GFRAA 84* 08/22/2014 1009    GFR Estimated Creatinine Clearance: 95.6 mL/min (by C-G formula based on Cr of 1.04). No results for input(s): LIPASE, AMYLASE in the last 72 hours. No results for input(s): CKTOTAL, CKMB, CKMBINDEX, TROPONINI in the last 72 hours. Invalid input(s): POCBNP No results for input(s): DDIMER in the last 72 hours.  Recent Labs  08/22/14 1009  HGBA1C 5.6    Recent Labs  08/23/14 0618  CHOL 165  HDL 51  LDLCALC 88  TRIG 131  CHOLHDL 3.2   No results for input(s): TSH, T4TOTAL, T3FREE, THYROIDAB in the last 72 hours.  Invalid input(s): FREET3 No results for input(s): VITAMINB12, FOLATE, FERRITIN, TIBC, IRON, RETICCTPCT in the last 72 hours. Coags:  Recent Labs  08/22/14 1009  INR 1.10   Microbiology: No results found for this or any previous visit (from the past 240 hour(s)).   BRIEF HOSPITAL COURSE:    Suspected TIA (transient ischemic attack):admitted with confusion, and asking same questions multiple times. Was thought to have a left  facial droop as well. Confusion, and left facial droop resolved yesterday at time of admit. Suspected TIA,admitted for further work.CT head negative (unable to do MRI as has spinal cord stimulator), Echo negative for embolic source, Carotid Doppler neg for significant stenosis. LDL 88, start Statins. A1C at 5.6.EEG negative. On ASA 81 mg prior to admission, will increase to ASA 325 mg on discharge.  Active Problems:  Tobacco Abuse:counseled.   Chronic Pain synd:continue with Narcotics.Spinal Cord Stimulator in place.    Depression/Anxiety:stable. Continue home meds.   TODAY-DAY OF DISCHARGE:  Subjective:   Joseph Foster today has no headache,no chest abdominal pain,no new weakness tingling or numbness, feels much better wants to go home today.   Objective:   Blood pressure 130/57, pulse 92, temperature 97.5 F (36.4 C), temperature source Oral, resp. rate 20, height 6\' 2"  (1.88 m), weight 118.389 kg (  261 lb), SpO2 95 %.  Intake/Output Summary (Last 24 hours) at 08/23/14 1509 Last data filed at 08/23/14 1352  Gross per 24 hour  Intake    480 ml  Output      0 ml  Net    480 ml   Filed Weights   08/22/14 0919  Weight: 118.389 kg (261 lb)    Exam Awake Alert, Oriented *3, No new F.N deficits, Normal affect [place].AT,PERRAL Supple Neck,No JVD, No cervical lymphadenopathy appriciated.  Symmetrical Chest wall movement, Good air movement bilaterally, CTAB RRR,No Gallops,Rubs or new Murmurs, No Parasternal Heave +ve B.Sounds, Abd Soft, Non tender, No organomegaly appriciated, No rebound -guarding or rigidity. No Cyanosis, Clubbing or edema, No new Rash or bruise  DISCHARGE CONDITION: Stable  DISPOSITION: Home  DISCHARGE INSTRUCTIONS:    Activity:  As tolerated   Diet recommendation: Heart Healthy diet  Discharge Instructions    Ambulatory referral to Neurology    Complete by:  As directed   Stroke patient. Dr. Leonie Man prefers follow up in 1 month     Call MD for:     Complete by:  As directed   Slurred speech, weakness     Diet - low sodium heart healthy    Complete by:  As directed      Increase activity slowly    Complete by:  As directed            Follow-up Information    Follow up with SETHI,PRAMOD, MD In 1 month.   Specialties:  Neurology, Radiology   Why:  Stroke Clinic, Office will call you with appointment date & time   Contact information:   [place] [address] [phone]       Follow up with Gennette Pac, MD. Schedule an appointment as soon as possible for a visit in 1 week.   Specialty:  Family Medicine   Contact information:   [place]  [id number] [phone]      Total Time spent on discharge equals 45 minutes.  SignedOren Binet 08/23/2014 3:09 PM

## 2014-08-23 NOTE — Procedures (Signed)
ELECTROENCEPHALOGRAM REPORT   Patient: Joseph Foster       Room #: 1P94 EEG No. ID: [id number] Age: 68 y.o.        Sex: male Referring Physician: Ghimire Report Date:  08/23/2014        Interpreting Physician: Alexis Goodell D  History: Joseph Foster is an 68 y.o. male with altered mental status  Medications:  Scheduled: . [START ON 08/24/2014] aspirin EC  325 mg Oral Daily  . docusate sodium  100 mg Oral BID  . enoxaparin (LOVENOX) injection  40 mg Subcutaneous Q24H  . folic acid  1 mg Oral Daily  . morphine  30 mg Oral Q12H  . multivitamin with minerals  1 tablet Oral Daily  . nicotine  14 mg Transdermal Q24H  . simvastatin  20 mg Oral q1800  . tamsulosin  0.4 mg Oral Daily  . thiamine  100 mg Oral Daily   Or  . thiamine  100 mg Intravenous Daily    Conditions of Recording:  This is a 16 channel EEG carried out with the patient in the awake and drowsy states.  Description:  The waking background activity consists of a low voltage, symmetrical, fairly well organized, 8-9 Hz alpha activity, seen from the parieto-occipital and posterior temporal regions.  Low voltage fast activity, poorly organized, is seen anteriorly and is at times superimposed on more posterior regions.  A mixture of theta and alpha rhythms are seen from the central and temporal regions. The patient drowses with slowing to irregular, low voltage theta and beta activity.   Stage II sleep is not obtained. Hyperventilation was not performed.  Intermittent photic stimulation was performed and elicits a symmetrical driving response but fails to elicit any abnormalities.  IMPRESSION: Normal electroencephalogram, awake, drowsy and with activation procedures. There are no focal lateralizing or epileptiform features.   Alexis Goodell, MD [facility] [phone] 08/23/2014, 2:01 PM

## 2014-10-28 ENCOUNTER — Other Ambulatory Visit: Payer: Self-pay | Admitting: Internal Medicine

## 2014-11-03 ENCOUNTER — Ambulatory Visit: Payer: BC Managed Care – PPO | Admitting: Neurology

## 2014-12-12 NOTE — Progress Notes (Signed)
Please put orders in Epic surgery 12-26-14 pre op 12-19-14 Thanks

## 2014-12-13 ENCOUNTER — Ambulatory Visit: Payer: Self-pay | Admitting: Orthopedic Surgery

## 2014-12-13 NOTE — Progress Notes (Signed)
Preoperative surgical orders have been place into the Epic hospital system for [name] on 12/13/2014, 2:06 PM  by Mickel Crow for surgery on 12-26-2014.  Preop Total Knee orders including Experal, IV Tylenol, and IV Decadron as long as there are no contraindications to the above medications. Arlee Muslim, PA-C

## 2014-12-15 ENCOUNTER — Encounter (HOSPITAL_COMMUNITY): Payer: Self-pay

## 2014-12-16 NOTE — Patient Instructions (Addendum)
Joseph Foster  12/16/2014   Your procedure is scheduled on:  12/26/2014    Report to [facility] Main  Entrance and follow signs to               [place] at      0600 AM.  Call this number if you have problems the morning of surgery [phone]   Remember:  Do not eat food or drink liquids :After Midnight.     Take these medicines the morning of surgery with A SIP OF WATER:  Xanax if needed, MS Contin , [place] nasal spray if needed                                You may not have any metal on your body including hair pins and              piercings  Do not wear jewelry, , lotions, powders or perfumes., deodorant.                         Men may shave face and neck.   Do not bring valuables to the hospital. [place].  Contacts, dentures or bridgework may not be worn into surgery.  Leave suitcase in the car. After surgery it may be brought to your room.        Special Instructions: coughing and deep breathing exercises, leg exercises               Please read over the following fact sheets you were given: _____________________________________________________________________             [facility] - Preparing for Surgery Before surgery, you can play an important role.  Because skin is not sterile, your skin needs to be as free of germs as possible.  You can reduce the number of germs on your skin by washing with CHG (chlorahexidine gluconate) soap before surgery.  CHG is an antiseptic cleaner which kills germs and bonds with the skin to continue killing germs even after washing. Please DO NOT use if you have an allergy to CHG or antibacterial soaps.  If your skin becomes reddened/irritated stop using the CHG and inform your nurse when you arrive at Short Stay. Do not shave (including legs and underarms) for at least 48 hours prior to the first CHG shower.  You may shave your face/neck. Please  follow these instructions carefully:  1.  Shower with CHG Soap the night before surgery and the  morning of Surgery.  2.  If you choose to wash your hair, wash your hair first as usual with your  normal  shampoo.  3.  After you shampoo, rinse your hair and body thoroughly to remove the  shampoo.                           4.  Use CHG as you would any other liquid soap.  You can apply chg directly  to the skin and wash                       Gently with a scrungie or clean washcloth.  5.  Apply the CHG Soap to your body ONLY FROM THE NECK DOWN.   Do not use on face/ open                           Wound or open sores. Avoid contact with eyes, ears mouth and genitals (private parts).                       Wash face,  Genitals (private parts) with your normal soap.             6.  Wash thoroughly, paying special attention to the area where your surgery  will be performed.  7.  Thoroughly rinse your body with warm water from the neck down.  8.  DO NOT shower/wash with your normal soap after using and rinsing off  the CHG Soap.                9.  Pat yourself dry with a clean towel.            10.  Wear clean pajamas.            11.  Place clean sheets on your bed the night of your first shower and do not  sleep with pets. Day of Surgery : Do not apply any lotions/deodorants the morning of surgery.  Please wear clean clothes to the hospital/surgery center.  FAILURE TO FOLLOW THESE INSTRUCTIONS MAY RESULT IN THE CANCELLATION OF YOUR SURGERY PATIENT SIGNATURE_________________________________  NURSE SIGNATURE__________________________________  ________________________________________________________________________  WHAT IS A BLOOD TRANSFUSION? Blood Transfusion Information  A transfusion is the replacement of blood or some of its parts. Blood is made up of multiple cells which provide different functions.  Red blood cells carry oxygen and are used for blood loss replacement.  White blood cells  fight against infection.  Platelets control bleeding.  Plasma helps clot blood.  Other blood products are available for specialized needs, such as hemophilia or other clotting disorders. BEFORE THE TRANSFUSION  Who gives blood for transfusions?   Healthy volunteers who are fully evaluated to make sure their blood is safe. This is blood bank blood. Transfusion therapy is the safest it has ever been in the practice of medicine. Before blood is taken from a donor, a complete history is taken to make sure that person has no history of diseases nor engages in risky social behavior (examples are intravenous drug use or sexual activity with multiple partners). The donor's travel history is screened to minimize risk of transmitting infections, such as malaria. The donated blood is tested for signs of infectious diseases, such as HIV and hepatitis. The blood is then tested to be sure it is compatible with you in order to minimize the chance of a transfusion reaction. If you or a relative donates blood, this is often done in anticipation of surgery and is not appropriate for emergency situations. It takes many days to process the donated blood. RISKS AND COMPLICATIONS Although transfusion therapy is very safe and saves many lives, the main dangers of transfusion include:  1. Getting an infectious disease. 2. Developing a transfusion reaction. This is an allergic reaction to something in the blood you were given. Every precaution is taken to prevent this. The decision to have a blood transfusion has been considered carefully by your caregiver before blood is given. Blood is not given unless the benefits outweigh the risks. AFTER THE TRANSFUSION  Right after receiving a  blood transfusion, you will usually feel much better and more energetic. This is especially true if your red blood cells have gotten low (anemic). The transfusion raises the level of the red blood cells which carry oxygen, and this usually  causes an energy increase.  The nurse administering the transfusion will monitor you carefully for complications. HOME CARE INSTRUCTIONS  No special instructions are needed after a transfusion. You may find your energy is better. Speak with your caregiver about any limitations on activity for underlying diseases you may have. SEEK MEDICAL CARE IF:   Your condition is not improving after your transfusion.  You develop redness or irritation at the intravenous (IV) site. SEEK IMMEDIATE MEDICAL CARE IF:  Any of the following symptoms occur over the next 12 hours:  Shaking chills.  You have a temperature by mouth above 102 F (38.9 C), not controlled by medicine.  Chest, back, or muscle pain.  People around you feel you are not acting correctly or are confused.  Shortness of breath or difficulty breathing.  Dizziness and fainting.  You get a rash or develop hives.  You have a decrease in urine output.  Your urine turns a dark color or changes to pink, red, or brown. Any of the following symptoms occur over the next 10 days:  You have a temperature by mouth above 102 F (38.9 C), not controlled by medicine.  Shortness of breath.  Weakness after normal activity.  The white part of the eye turns yellow (jaundice).  You have a decrease in the amount of urine or are urinating less often.  Your urine turns a dark color or changes to pink, red, or brown. Document Released: 09/13/2000 Document Revised: 12/09/2011 Document Reviewed: 05/02/2008 ExitCare Patient Information 2014 [place].  _______________________________________________________________________  Incentive Spirometer  An incentive spirometer is a tool that can help keep your lungs clear and active. This tool measures how well you are filling your lungs with each breath. Taking long deep breaths may help reverse or decrease the chance of developing breathing (pulmonary) problems (especially infection)  following:  A long period of time when you are unable to move or be active. BEFORE THE PROCEDURE   If the spirometer includes an indicator to show your best effort, your nurse or respiratory therapist will set it to a desired goal.  If possible, sit up straight or lean slightly forward. Try not to slouch.  Hold the incentive spirometer in an upright position. INSTRUCTIONS FOR USE  3. Sit on the edge of your bed if possible, or sit up as far as you can in bed or on a chair. 4. Hold the incentive spirometer in an upright position. 5. Breathe out normally. 6. Place the mouthpiece in your mouth and seal your lips tightly around it. 7. Breathe in slowly and as deeply as possible, raising the piston or the ball toward the top of the column. 8. Hold your breath for 3-5 seconds or for as long as possible. Allow the piston or ball to fall to the bottom of the column. 9. Remove the mouthpiece from your mouth and breathe out normally. 10. Rest for a few seconds and repeat Steps 1 through 7 at least 10 times every 1-2 hours when you are awake. Take your time and take a few normal breaths between deep breaths. 11. The spirometer may include an indicator to show your best effort. Use the indicator as a goal to work toward during each repetition. 12. After each set of 10  deep breaths, practice coughing to be sure your lungs are clear. If you have an incision (the cut made at the time of surgery), support your incision when coughing by placing a pillow or rolled up towels firmly against it. Once you are able to get out of bed, walk around indoors and cough well. You may stop using the incentive spirometer when instructed by your caregiver.  RISKS AND COMPLICATIONS  Take your time so you do not get dizzy or light-headed.  If you are in pain, you may need to take or ask for pain medication before doing incentive spirometry. It is harder to take a deep breath if you are having pain. AFTER USE  Rest and  breathe slowly and easily.  It can be helpful to keep track of a log of your progress. Your caregiver can provide you with a simple table to help with this. If you are using the spirometer at home, follow these instructions: [place] IF:   You are having difficultly using the spirometer.  You have trouble using the spirometer as often as instructed.  Your pain medication is not giving enough relief while using the spirometer.  You develop fever of 100.5 F (38.1 C) or higher. SEEK IMMEDIATE MEDICAL CARE IF:   You cough up bloody sputum that had not been present before.  You develop fever of 102 F (38.9 C) or greater.  You develop worsening pain at or near the incision site. MAKE SURE YOU:   Understand these instructions.  Will watch your condition.  Will get help right away if you are not doing well or get worse. Document Released: 01/27/2007 Document Revised: 12/09/2011 Document Reviewed: 03/30/2007 [facility] Patient Information 2014 [place], [place].   ________________________________________________________________________

## 2014-12-19 ENCOUNTER — Encounter (HOSPITAL_COMMUNITY)
Admission: RE | Admit: 2014-12-19 | Discharge: 2014-12-19 | Disposition: A | Discharge status: Home / Self Care | Payer: BLUE CROSS/BLUE SHIELD | Source: Ambulatory Visit | Attending: Orthopedic Surgery | Admitting: Orthopedic Surgery

## 2014-12-19 ENCOUNTER — Encounter (HOSPITAL_COMMUNITY): Payer: Self-pay

## 2014-12-19 DIAGNOSIS — Z01812 Encounter for preprocedural laboratory examination: Secondary | ICD-10-CM | POA: Diagnosis present

## 2014-12-19 HISTORY — DX: Gastro-esophageal reflux disease without esophagitis: K21.9

## 2014-12-19 HISTORY — DX: Cerebral infarction, unspecified: I63.9

## 2014-12-19 LAB — URINALYSIS, ROUTINE W REFLEX MICROSCOPIC
Bilirubin Urine: NEGATIVE
Glucose, UA: NEGATIVE mg/dL
Ketones, ur: NEGATIVE mg/dL
Leukocytes, UA: NEGATIVE
Nitrite: NEGATIVE
Protein, ur: NEGATIVE mg/dL
Specific Gravity, Urine: 1.018 (ref 1.005–1.030)
Urobilinogen, UA: 0.2 mg/dL (ref 0.0–1.0)
pH: 6 (ref 5.0–8.0)

## 2014-12-19 LAB — APTT: aPTT: 36 seconds (ref 24–37)

## 2014-12-19 LAB — PROTIME-INR
INR: 1.11 (ref 0.00–1.49)
Prothrombin Time: 14.4 seconds (ref 11.6–15.2)

## 2014-12-19 LAB — CBC
HCT: 41.7 % (ref 39.0–52.0)
Hemoglobin: 13.8 g/dL (ref 13.0–17.0)
MCH: 30.5 pg (ref 26.0–34.0)
MCHC: 33.1 g/dL (ref 30.0–36.0)
MCV: 92.1 fL (ref 78.0–100.0)
Platelets: 193 10*3/uL (ref 150–400)
RBC: 4.53 MIL/uL (ref 4.22–5.81)
RDW: 13.5 % (ref 11.5–15.5)
WBC: 6.4 10*3/uL (ref 4.0–10.5)

## 2014-12-19 LAB — COMPREHENSIVE METABOLIC PANEL
ALT: 19 U/L (ref 0–53)
AST: 21 U/L (ref 0–37)
Albumin: 4.2 g/dL (ref 3.5–5.2)
Alkaline Phosphatase: 26 U/L — ABNORMAL LOW (ref 39–117)
Anion gap: 10 (ref 5–15)
BUN: 20 mg/dL (ref 6–23)
CO2: 27 mmol/L (ref 19–32)
Calcium: 9.2 mg/dL (ref 8.4–10.5)
Chloride: 104 mmol/L (ref 96–112)
Creatinine, Ser: 1.16 mg/dL (ref 0.50–1.35)
GFR calc Af Amer: 74 mL/min — ABNORMAL LOW (ref >90)
GFR calc non Af Amer: 64 mL/min — ABNORMAL LOW (ref >90)
Glucose, Bld: 95 mg/dL (ref 70–99)
Potassium: 4.6 mmol/L (ref 3.5–5.1)
Sodium: 141 mmol/L (ref 135–145)
Total Bilirubin: 0.6 mg/dL (ref 0.3–1.2)
Total Protein: 7.3 g/dL (ref 6.0–8.3)

## 2014-12-19 LAB — URINE MICROSCOPIC-ADD ON

## 2014-12-19 LAB — SURGICAL PCR SCREEN
MRSA, PCR: NEGATIVE
Staphylococcus aureus: POSITIVE — AB

## 2014-12-19 NOTE — Progress Notes (Signed)
U/A with micro results faxed via EPIC to Dr Wynelle Link.

## 2014-12-19 NOTE — Progress Notes (Addendum)
EKG- 08/23/14 EPIC  And EKG- 09/16/14 on chart  ECHO- 08/23/14 EPIC  Carotids- 08/23/14 EPIC  Clearance- Dr Rex Kras- 09/16/14 on chart  Along with LOV note.  EEG- 08/23/14 EPIC

## 2014-12-20 NOTE — Progress Notes (Signed)
Patient received message regarding positive nasal swab and patient called back to stated he had received message.

## 2014-12-21 NOTE — Progress Notes (Signed)
Dr Conrad [place] ( anesthesia ) made aware that patient admitted on 07/2014 with some confusion.  ? Suspected  TIA on discharge summary  In EPIC.  Patient to followup with Neurology on 02/09/2015.  No appointments with neurology since discharge of 07/2014.  ECHO, EEG and Carotid duplex done at time of hospitalization in 07/2014.  Clearance for surgery on chart dated 09/16/2014 from Dr Hulan Fess ( PCP)  Along with his office visit note of 09/16/2014.  No new orders given.

## 2014-12-21 NOTE — H&P (Signed)
TOTAL KNEE ADMISSION H&P  Patient is being admitted for right total knee arthroplasty.  Subjective:  Chief Complaint:right knee pain.  HPI: Joseph Foster, 69 y.o. male, has a history of pain and functional disability in the right knee due to arthritis and has failed non-surgical conservative treatments for greater than 12 weeks to includeNSAID's and/or analgesics, corticosteriod injections and activity modification.  Onset of symptoms was gradual, starting 5 years ago with gradually worsening course since that time. The patient noted prior procedures on the knee to include  arthroscopy and menisectomy on the right knee(s).  Patient currently rates pain in the right knee(s) at 7 out of 10 with activity. Patient has night pain, worsening of pain with activity and weight bearing, pain that interferes with activities of daily living, pain with passive range of motion, crepitus and joint swelling.  Patient has evidence of periarticular osteophytes and joint space narrowing by imaging studies.  There is no active infection.  Patient Active Problem List   Diagnosis Date Noted  . Agitation   . TIA (transient ischemic attack) 08/22/2014  . Tobacco abuse 08/22/2014  . Alcohol abuse 08/22/2014  . Chronic pain 08/22/2014  . Depression 08/22/2014  . Confusion   . Numbness 04/19/2013  . Acute medial meniscal tear 01/15/2012   Past Medical History  Diagnosis Date  . DDD (degenerative disc disease), lumbar L4  . Chronic back pain   . Arthritis KNEES  . Acute medial meniscus tear of left knee   . Depression   . Stroke     ? TIA   . GERD (gastroesophageal reflux disease)     Past Surgical History  Procedure Laterality Date  . Spinal cord stimulator implant  2009  . Right knee surg.  2005  . Knee arthroscopy  01/15/2012    Procedure: ARTHROSCOPY KNEE;  Surgeon: Gearlean Alf, MD;  Location: [facility];  Service: Orthopedics;  Laterality: Left;  meniscal debridement  .  Tonsilectomy/adenoidectomy with myringotomy    . Vasectomy    . Hc eeg adult  08/23/2014         Current outpatient prescriptions:  .  ALPRAZolam (XANAX) 0.5 MG tablet, Take 0.5 mg by mouth 2 (two) times daily as needed for anxiety., Disp: , Rfl:  .  aspirin EC 325 MG tablet, Take 1 tablet (325 mg total) by mouth daily., Disp: 60 tablet, Rfl: 0 .  cyclobenzaprine (FLEXERIL) 10 MG tablet, Take 10 mg by mouth daily as needed for muscle spasms. , Disp: , Rfl:  .  morphine (MS CONTIN) 30 MG 12 hr tablet, Take 30 mg by mouth every 12 (twelve) hours., Disp: , Rfl:  .  morphine (MSIR) 15 MG tablet, Take 15 mg by mouth every 4 (four) hours as needed for moderate pain or severe pain., Disp: , Rfl:  .  Multiple Vitamin (MULTIVITAMIN) tablet, Take 1 tablet by mouth daily., Disp: , Rfl:  .  PARoxetine (PAXIL) 10 MG tablet, Take 40 mg by mouth every morning. , Disp: , Rfl:  .  simvastatin (ZOCOR) 20 MG tablet, Take 1 tablet (20 mg total) by mouth daily at 6 PM., Disp: 60 tablet, Rfl: 0 .  sodium chloride (OCEAN) 0.65 % SOLN nasal spray, Place 1 spray into both nostrils as needed for congestion., Disp: , Rfl:  .  tamsulosin (FLOMAX) 0.4 MG CAPS capsule, Take 0.4 mg by mouth daily., Disp: , Rfl:   Allergies  Allergen Reactions  . Ceclor [Cefaclor] Itching  . Codeine  Hyper   . Penicillins Hives  . Oxycontin [Oxycodone Hcl] Rash    History  Substance Use Topics  . Smoking status: Former Smoker    Types: Cigars    Quit date: 01/09/2001  . Smokeless tobacco: Current User    Types: Snuff  . Alcohol Use: 12.6 oz/week    7 Cans of beer, 14 Shots of liquor per week    Family History  Problem Relation Age of Onset  . Heart disease Father   . Leukemia Mother   . Hyperlipidemia Mother      Review of Systems  Constitutional: Negative.   HENT: Negative.   Eyes: Negative.   Respiratory: Positive for shortness of breath. Negative for cough, hemoptysis, sputum production and wheezing.         SOB with exertion  Cardiovascular: Negative.   Gastrointestinal: Positive for heartburn. Negative for nausea, vomiting, abdominal pain, diarrhea, constipation, blood in stool and melena.  Genitourinary: Negative.   Musculoskeletal: Positive for myalgias, back pain and joint pain. Negative for falls and neck pain.       Right knee pain  Skin: Negative.   Neurological: Negative.   Endo/Heme/Allergies: Negative.   Psychiatric/Behavioral: Negative.     Objective:  Physical Exam  Constitutional: He is oriented to person, place, and time. He appears well-developed.  Overweight  HENT:  Head: Normocephalic and atraumatic.  Right Ear: External ear normal.  Left Ear: External ear normal.  Nose: Nose normal.  Mouth/Throat: Oropharynx is clear and moist.  Eyes: Conjunctivae and EOM are normal.  Neck: Normal range of motion. Neck supple.  Cardiovascular: Normal rate, regular rhythm, normal heart sounds and intact distal pulses.   Respiratory: Effort normal and breath sounds normal. No respiratory distress. He has no wheezes.  GI: Soft. Bowel sounds are normal. He exhibits no distension. There is no tenderness.  Musculoskeletal:       Right hip: Normal.       Left hip: Normal.       Right knee: He exhibits decreased range of motion and swelling. He exhibits no effusion and no erythema. Tenderness found. Medial joint line and lateral joint line tenderness noted.       Left knee: Normal.       Right lower leg: He exhibits no tenderness and no swelling.       Left lower leg: He exhibits no tenderness and no swelling.  His right knee shows no effusion. His range is about 5 to 125 with moderate crepitus on range of motion with tenderness, medial greater than lateral, with no instability noted.  Neurological: He is alert and oriented to person, place, and time. He has normal strength and normal reflexes. No sensory deficit.  Skin: No rash noted. He is not diaphoretic. No erythema.  Psychiatric: He  has a normal mood and affect. His behavior is normal.    Vitals  Weight: 260 lb Height: 74in Body Surface Area: 2.43 m Body Mass Index: 33.38 kg/m  Pulse: 88 (Regular)  BP: 120/78 (Sitting, Left Arm, Standard)  Imaging Review Plain radiographs demonstrate severe degenerative joint disease of the right knee(s). The overall alignment ismild varus. The bone quality appears to be good for age and reported activity level.  Assessment/Plan:  End stage primary osteoarthritis, right knee   The patient history, physical examination, clinical judgment of the provider and imaging studies are consistent with end stage degenerative joint disease of the right knee(s) and total knee arthroplasty is deemed medically necessary. The treatment options  including medical management, injection therapy arthroscopy and arthroplasty were discussed at length. The risks and benefits of total knee arthroplasty were presented and reviewed. The risks due to aseptic loosening, infection, stiffness, patella tracking problems, thromboembolic complications and other imponderables were discussed. The patient acknowledged the explanation, agreed to proceed with the plan and consent was signed. Patient is being admitted for inpatient treatment for surgery, pain control, PT, OT, prophylactic antibiotics, VTE prophylaxis, progressive ambulation and ADL's and discharge planning. The patient is planning to be discharged home with home health services   PCP: Dr. Rex Kras Needs walker and cane Has Rx for Flexeril already Topical TXA- history of TIA    Ardeen Jourdain, PA-C

## 2014-12-21 NOTE — Progress Notes (Signed)
Patient to bring in remote for spinal cord stiumulator along with his registration card on day of surgery.

## 2014-12-21 NOTE — Progress Notes (Signed)
Anesthesia made aware ( Dr Conrad Mayaguez) that pain has spinal cord stimulator and will bring in remote for device on day of surgery.  No new orders given.

## 2014-12-25 MED ORDER — VANCOMYCIN HCL 10 G IV SOLR
1500.0000 mg | INTRAVENOUS | Status: AC
  Administered 2014-12-26: 1500 mg via INTRAVENOUS
  Filled 2014-12-25: qty 1500

## 2014-12-26 ENCOUNTER — Encounter (HOSPITAL_COMMUNITY)
Admission: RE | Disposition: A | Discharge status: Home Health | Payer: Self-pay | Source: Ambulatory Visit | Attending: Orthopedic Surgery

## 2014-12-26 ENCOUNTER — Inpatient Hospital Stay (HOSPITAL_COMMUNITY): Payer: BLUE CROSS/BLUE SHIELD | Admitting: Certified Registered"

## 2014-12-26 ENCOUNTER — Encounter (HOSPITAL_COMMUNITY): Payer: Self-pay | Admitting: Certified Registered"

## 2014-12-26 ENCOUNTER — Inpatient Hospital Stay (HOSPITAL_COMMUNITY)
Admission: RE | Admit: 2014-12-26 | Discharge: 2014-12-28 | DRG: 470 | Disposition: A | Discharge status: Home Health | Payer: BLUE CROSS/BLUE SHIELD | Source: Ambulatory Visit | Attending: Orthopedic Surgery | Admitting: Orthopedic Surgery

## 2014-12-26 DIAGNOSIS — M1710 Unilateral primary osteoarthritis, unspecified knee: Secondary | ICD-10-CM | POA: Diagnosis present

## 2014-12-26 DIAGNOSIS — R338 Other retention of urine: Secondary | ICD-10-CM | POA: Diagnosis not present

## 2014-12-26 DIAGNOSIS — F17220 Nicotine dependence, chewing tobacco, uncomplicated: Secondary | ICD-10-CM | POA: Diagnosis present

## 2014-12-26 DIAGNOSIS — Z01812 Encounter for preprocedural laboratory examination: Secondary | ICD-10-CM

## 2014-12-26 DIAGNOSIS — Z8673 Personal history of transient ischemic attack (TIA), and cerebral infarction without residual deficits: Secondary | ICD-10-CM | POA: Diagnosis not present

## 2014-12-26 DIAGNOSIS — Z6832 Body mass index (BMI) 32.0-32.9, adult: Secondary | ICD-10-CM

## 2014-12-26 DIAGNOSIS — M1711 Unilateral primary osteoarthritis, right knee: Principal | ICD-10-CM | POA: Diagnosis present

## 2014-12-26 DIAGNOSIS — Z7982 Long term (current) use of aspirin: Secondary | ICD-10-CM

## 2014-12-26 DIAGNOSIS — E663 Overweight: Secondary | ICD-10-CM | POA: Diagnosis present

## 2014-12-26 DIAGNOSIS — Z79899 Other long term (current) drug therapy: Secondary | ICD-10-CM | POA: Diagnosis not present

## 2014-12-26 DIAGNOSIS — M25561 Pain in right knee: Secondary | ICD-10-CM | POA: Diagnosis present

## 2014-12-26 DIAGNOSIS — M179 Osteoarthritis of knee, unspecified: Secondary | ICD-10-CM | POA: Diagnosis present

## 2014-12-26 DIAGNOSIS — K219 Gastro-esophageal reflux disease without esophagitis: Secondary | ICD-10-CM | POA: Diagnosis present

## 2014-12-26 HISTORY — PX: TOTAL KNEE ARTHROPLASTY: SHX125

## 2014-12-26 LAB — TYPE AND SCREEN
ABO/RH(D): O POS
Antibody Screen: NEGATIVE

## 2014-12-26 LAB — ABO/RH: ABO/RH(D): O POS

## 2014-12-26 SURGERY — ARTHROPLASTY, KNEE, TOTAL
Anesthesia: General | Site: Knee | Laterality: Right

## 2014-12-26 MED ORDER — PAROXETINE HCL 20 MG PO TABS
40.0000 mg | ORAL_TABLET | Freq: Every day | ORAL | Status: DC
  Administered 2014-12-26 – 2014-12-28 (×3): 40 mg via ORAL
  Filled 2014-12-26 (×3): qty 2

## 2014-12-26 MED ORDER — BUPIVACAINE LIPOSOME 1.3 % IJ SUSP
INTRAMUSCULAR | Status: DC | PRN
  Administered 2014-12-26: 20 mL

## 2014-12-26 MED ORDER — HYDROMORPHONE HCL 1 MG/ML IJ SOLN
0.5000 mg | INTRAMUSCULAR | Status: DC | PRN
  Administered 2014-12-26: 0.5 mg via INTRAVENOUS
  Administered 2014-12-27 (×2): 1 mg via INTRAVENOUS
  Filled 2014-12-26 (×3): qty 1

## 2014-12-26 MED ORDER — DOCUSATE SODIUM 100 MG PO CAPS
100.0000 mg | ORAL_CAPSULE | Freq: Two times a day (BID) | ORAL | Status: DC
  Administered 2014-12-26 – 2014-12-27 (×3): 100 mg via ORAL

## 2014-12-26 MED ORDER — LABETALOL HCL 5 MG/ML IV SOLN
INTRAVENOUS | Status: DC | PRN
  Administered 2014-12-26 (×2): 5 mg via INTRAVENOUS

## 2014-12-26 MED ORDER — EPHEDRINE SULFATE 50 MG/ML IJ SOLN
INTRAMUSCULAR | Status: DC | PRN
  Administered 2014-12-26: 10 mg via INTRAVENOUS

## 2014-12-26 MED ORDER — CHLORHEXIDINE GLUCONATE 4 % EX LIQD: 60.0000 mL | Freq: Once | CUTANEOUS | Status: DC

## 2014-12-26 MED ORDER — POLYETHYLENE GLYCOL 3350 17 G PO PACK
17.0000 g | PACK | Freq: Every day | ORAL | Status: DC | PRN
Start: 2014-12-26 — End: 2014-12-28

## 2014-12-26 MED ORDER — ROCURONIUM BROMIDE 100 MG/10ML IV SOLN
INTRAVENOUS | Status: DC | PRN
  Administered 2014-12-26: 30 mg via INTRAVENOUS

## 2014-12-26 MED ORDER — DEXAMETHASONE SODIUM PHOSPHATE 10 MG/ML IJ SOLN
INTRAMUSCULAR | Status: AC
  Filled 2014-12-26: qty 1

## 2014-12-26 MED ORDER — TRANEXAMIC ACID 100 MG/ML IV SOLN
2000.0000 mg | INTRAVENOUS | Status: DC | PRN
  Administered 2014-12-26: 2000 mg via TOPICAL

## 2014-12-26 MED ORDER — ACETAMINOPHEN 650 MG RE SUPP: 650.0000 mg | Freq: Four times a day (QID) | RECTAL | Status: DC | PRN

## 2014-12-26 MED ORDER — BUPIVACAINE LIPOSOME 1.3 % IJ SUSP
20.0000 mL | Freq: Once | INTRAMUSCULAR | Status: DC
  Filled 2014-12-26: qty 20

## 2014-12-26 MED ORDER — METOCLOPRAMIDE HCL 5 MG/ML IJ SOLN: 5.0000 mg | Freq: Three times a day (TID) | INTRAMUSCULAR | Status: DC | PRN

## 2014-12-26 MED ORDER — BUPIVACAINE HCL 0.25 % IJ SOLN
INTRAMUSCULAR | Status: DC | PRN
  Administered 2014-12-26: 30 mL

## 2014-12-26 MED ORDER — FLEET ENEMA 7-19 GM/118ML RE ENEM: 1.0000 | ENEMA | Freq: Once | RECTAL | Status: AC | PRN

## 2014-12-26 MED ORDER — HYDROMORPHONE HCL 1 MG/ML IJ SOLN
INTRAMUSCULAR | Status: DC | PRN
  Administered 2014-12-26 (×2): 1 mg via INTRAVENOUS

## 2014-12-26 MED ORDER — ACETAMINOPHEN 325 MG PO TABS: 650.0000 mg | ORAL_TABLET | Freq: Four times a day (QID) | ORAL | Status: DC | PRN

## 2014-12-26 MED ORDER — ALPRAZOLAM 0.5 MG PO TABS
0.5000 mg | ORAL_TABLET | Freq: Two times a day (BID) | ORAL | Status: DC | PRN
Start: 2014-12-26 — End: 2014-12-28

## 2014-12-26 MED ORDER — KETOROLAC TROMETHAMINE 15 MG/ML IJ SOLN
7.5000 mg | Freq: Four times a day (QID) | INTRAMUSCULAR | Status: AC | PRN
  Administered 2014-12-26: 7.5 mg via INTRAVENOUS
  Filled 2014-12-26: qty 1

## 2014-12-26 MED ORDER — MIDAZOLAM HCL 2 MG/2ML IJ SOLN
INTRAMUSCULAR | Status: AC
  Filled 2014-12-26: qty 2

## 2014-12-26 MED ORDER — HYDROMORPHONE HCL 1 MG/ML IJ SOLN
INTRAMUSCULAR | Status: AC
  Filled 2014-12-26: qty 1

## 2014-12-26 MED ORDER — MORPHINE SULFATE 15 MG PO TABS
15.0000 mg | ORAL_TABLET | ORAL | Status: DC | PRN
  Administered 2014-12-27 (×2): 15 mg via ORAL
  Filled 2014-12-26 (×4): qty 1

## 2014-12-26 MED ORDER — ACETAMINOPHEN 10 MG/ML IV SOLN
1000.0000 mg | Freq: Once | INTRAVENOUS | Status: DC
  Filled 2014-12-26: qty 100

## 2014-12-26 MED ORDER — MIDAZOLAM HCL 5 MG/5ML IJ SOLN
INTRAMUSCULAR | Status: DC | PRN
  Administered 2014-12-26: 2 mg via INTRAVENOUS

## 2014-12-26 MED ORDER — ONDANSETRON HCL 4 MG/2ML IJ SOLN
INTRAMUSCULAR | Status: AC
  Filled 2014-12-26: qty 2

## 2014-12-26 MED ORDER — SODIUM CHLORIDE 0.9 % IJ SOLN
INTRAMUSCULAR | Status: AC
  Filled 2014-12-26: qty 50

## 2014-12-26 MED ORDER — FENTANYL CITRATE 0.05 MG/ML IJ SOLN
INTRAMUSCULAR | Status: AC
  Filled 2014-12-26: qty 2

## 2014-12-26 MED ORDER — METOCLOPRAMIDE HCL 10 MG PO TABS: 5.0000 mg | ORAL_TABLET | Freq: Three times a day (TID) | ORAL | Status: DC | PRN

## 2014-12-26 MED ORDER — ONDANSETRON HCL 4 MG/2ML IJ SOLN: 4.0000 mg | Freq: Four times a day (QID) | INTRAMUSCULAR | Status: DC | PRN

## 2014-12-26 MED ORDER — EPHEDRINE SULFATE 50 MG/ML IJ SOLN
INTRAMUSCULAR | Status: AC
  Filled 2014-12-26: qty 1

## 2014-12-26 MED ORDER — PHENOL 1.4 % MT LIQD: 1.0000 | OROMUCOSAL | Status: DC | PRN

## 2014-12-26 MED ORDER — LIDOCAINE HCL (CARDIAC) 20 MG/ML IV SOLN
INTRAVENOUS | Status: AC
  Filled 2014-12-26: qty 5

## 2014-12-26 MED ORDER — SUCCINYLCHOLINE CHLORIDE 20 MG/ML IJ SOLN
INTRAMUSCULAR | Status: DC | PRN
  Administered 2014-12-26: 100 mg via INTRAVENOUS

## 2014-12-26 MED ORDER — SODIUM CHLORIDE 0.9 % IJ SOLN
INTRAMUSCULAR | Status: DC | PRN
  Administered 2014-12-26: 30 mL via INTRAVENOUS

## 2014-12-26 MED ORDER — HYDROMORPHONE HCL 1 MG/ML IJ SOLN
0.2500 mg | INTRAMUSCULAR | Status: DC | PRN
  Administered 2014-12-26 (×4): 0.5 mg via INTRAVENOUS

## 2014-12-26 MED ORDER — ACETAMINOPHEN 500 MG PO TABS
1000.0000 mg | ORAL_TABLET | Freq: Four times a day (QID) | ORAL | Status: AC
  Administered 2014-12-26 (×3): 1000 mg via ORAL
  Filled 2014-12-26 (×3): qty 2

## 2014-12-26 MED ORDER — FENTANYL CITRATE 0.05 MG/ML IJ SOLN
INTRAMUSCULAR | Status: DC | PRN
  Administered 2014-12-26: 100 ug via INTRAVENOUS
  Administered 2014-12-26 (×2): 50 ug via INTRAVENOUS

## 2014-12-26 MED ORDER — LABETALOL HCL 5 MG/ML IV SOLN
INTRAVENOUS | Status: AC
  Filled 2014-12-26: qty 4

## 2014-12-26 MED ORDER — HYDROMORPHONE HCL 2 MG/ML IJ SOLN
INTRAMUSCULAR | Status: AC
  Filled 2014-12-26: qty 1

## 2014-12-26 MED ORDER — SIMVASTATIN 20 MG PO TABS
20.0000 mg | ORAL_TABLET | Freq: Every day | ORAL | Status: DC
  Administered 2014-12-26 – 2014-12-27 (×2): 20 mg via ORAL
  Filled 2014-12-26 (×3): qty 1

## 2014-12-26 MED ORDER — BISACODYL 10 MG RE SUPP: 10.0000 mg | Freq: Every day | RECTAL | Status: DC | PRN

## 2014-12-26 MED ORDER — GLYCOPYRROLATE 0.2 MG/ML IJ SOLN
INTRAMUSCULAR | Status: AC
  Filled 2014-12-26: qty 3

## 2014-12-26 MED ORDER — DEXTROSE-NACL 5-0.9 % IV SOLN
INTRAVENOUS | Status: DC
  Administered 2014-12-26 (×2): via INTRAVENOUS

## 2014-12-26 MED ORDER — LACTATED RINGERS IV SOLN: INTRAVENOUS | Status: DC

## 2014-12-26 MED ORDER — PROPOFOL 10 MG/ML IV BOLUS
INTRAVENOUS | Status: AC
  Filled 2014-12-26: qty 20

## 2014-12-26 MED ORDER — TAMSULOSIN HCL 0.4 MG PO CAPS
0.4000 mg | ORAL_CAPSULE | Freq: Every day | ORAL | Status: DC
  Administered 2014-12-26 – 2014-12-28 (×3): 0.4 mg via ORAL
  Filled 2014-12-26 (×3): qty 1

## 2014-12-26 MED ORDER — SODIUM CHLORIDE 0.9 % IJ SOLN
INTRAMUSCULAR | Status: AC
  Filled 2014-12-26: qty 10

## 2014-12-26 MED ORDER — RIVAROXABAN 10 MG PO TABS
10.0000 mg | ORAL_TABLET | Freq: Every day | ORAL | Status: DC
  Administered 2014-12-27 – 2014-12-28 (×2): 10 mg via ORAL
  Filled 2014-12-26 (×3): qty 1

## 2014-12-26 MED ORDER — MORPHINE SULFATE ER 30 MG PO TBCR
30.0000 mg | EXTENDED_RELEASE_TABLET | Freq: Two times a day (BID) | ORAL | Status: DC
  Administered 2014-12-26 – 2014-12-28 (×4): 30 mg via ORAL
  Filled 2014-12-26 (×4): qty 1

## 2014-12-26 MED ORDER — ACETAMINOPHEN 10 MG/ML IV SOLN
INTRAVENOUS | Status: DC | PRN
  Administered 2014-12-26: 1000 mg via INTRAVENOUS

## 2014-12-26 MED ORDER — DEXAMETHASONE SODIUM PHOSPHATE 10 MG/ML IJ SOLN
INTRAMUSCULAR | Status: DC | PRN
  Administered 2014-12-26: 10 mg via INTRAVENOUS

## 2014-12-26 MED ORDER — TRANEXAMIC ACID 100 MG/ML IV SOLN
2000.0000 mg | Freq: Once | INTRAVENOUS | Status: DC
  Filled 2014-12-26: qty 20

## 2014-12-26 MED ORDER — DEXAMETHASONE SODIUM PHOSPHATE 10 MG/ML IJ SOLN: 10.0000 mg | Freq: Once | INTRAMUSCULAR | Status: DC

## 2014-12-26 MED ORDER — PROPOFOL 10 MG/ML IV BOLUS
INTRAVENOUS | Status: DC | PRN
  Administered 2014-12-26: 200 mg via INTRAVENOUS

## 2014-12-26 MED ORDER — LIDOCAINE HCL (PF) 2 % IJ SOLN
INTRAMUSCULAR | Status: DC | PRN
  Administered 2014-12-26: 20 mg via INTRADERMAL

## 2014-12-26 MED ORDER — VANCOMYCIN HCL IN DEXTROSE 1-5 GM/200ML-% IV SOLN
1000.0000 mg | Freq: Two times a day (BID) | INTRAVENOUS | Status: AC
  Administered 2014-12-26: 1000 mg via INTRAVENOUS
  Filled 2014-12-26: qty 200

## 2014-12-26 MED ORDER — BUPIVACAINE HCL (PF) 0.25 % IJ SOLN
INTRAMUSCULAR | Status: AC
  Filled 2014-12-26: qty 30

## 2014-12-26 MED ORDER — METHOCARBAMOL 500 MG PO TABS
500.0000 mg | ORAL_TABLET | Freq: Four times a day (QID) | ORAL | Status: DC | PRN
  Administered 2014-12-26 – 2014-12-28 (×6): 500 mg via ORAL
  Filled 2014-12-26 (×6): qty 1

## 2014-12-26 MED ORDER — ONDANSETRON HCL 4 MG/2ML IJ SOLN
INTRAMUSCULAR | Status: DC | PRN
Start: 2014-12-26 — End: 2014-12-26
  Administered 2014-12-26: 4 mg via INTRAVENOUS

## 2014-12-26 MED ORDER — METHOCARBAMOL 1000 MG/10ML IJ SOLN
500.0000 mg | Freq: Four times a day (QID) | INTRAVENOUS | Status: DC | PRN
  Administered 2014-12-26: 500 mg via INTRAVENOUS
  Filled 2014-12-26 (×2): qty 5

## 2014-12-26 MED ORDER — LACTATED RINGERS IV SOLN
INTRAVENOUS | Status: DC | PRN
  Administered 2014-12-26 (×2): via INTRAVENOUS

## 2014-12-26 MED ORDER — DEXAMETHASONE SODIUM PHOSPHATE 10 MG/ML IJ SOLN
10.0000 mg | Freq: Once | INTRAMUSCULAR | Status: DC
  Filled 2014-12-26: qty 1

## 2014-12-26 MED ORDER — DIPHENHYDRAMINE HCL 12.5 MG/5ML PO ELIX: 12.5000 mg | ORAL_SOLUTION | ORAL | Status: DC | PRN

## 2014-12-26 MED ORDER — GLYCOPYRROLATE 0.2 MG/ML IJ SOLN
INTRAMUSCULAR | Status: DC | PRN
  Administered 2014-12-26: 0.6 mg via INTRAVENOUS

## 2014-12-26 MED ORDER — SODIUM CHLORIDE 0.9 % IV SOLN: INTRAVENOUS | Status: DC

## 2014-12-26 MED ORDER — ONDANSETRON HCL 4 MG PO TABS: 4.0000 mg | ORAL_TABLET | Freq: Four times a day (QID) | ORAL | Status: DC | PRN

## 2014-12-26 MED ORDER — MENTHOL 3 MG MT LOZG: 1.0000 | LOZENGE | OROMUCOSAL | Status: DC | PRN

## 2014-12-26 MED ORDER — FENTANYL CITRATE 0.05 MG/ML IJ SOLN: 25.0000 ug | INTRAMUSCULAR | Status: DC | PRN

## 2014-12-26 MED ORDER — NEOSTIGMINE METHYLSULFATE 10 MG/10ML IV SOLN
INTRAVENOUS | Status: DC | PRN
  Administered 2014-12-26: 3.5 mg via INTRAVENOUS

## 2014-12-26 MED ORDER — HYDROMORPHONE HCL 2 MG PO TABS
4.0000 mg | ORAL_TABLET | ORAL | Status: DC | PRN
Start: 2014-12-26 — End: 2014-12-28
  Administered 2014-12-26 – 2014-12-27 (×7): 4 mg via ORAL
  Administered 2014-12-28 (×4): 8 mg via ORAL
  Filled 2014-12-26: qty 4
  Filled 2014-12-26: qty 2
  Filled 2014-12-26 (×3): qty 4
  Filled 2014-12-26 (×4): qty 2
  Filled 2014-12-26: qty 4
  Filled 2014-12-26: qty 2

## 2014-12-26 SURGICAL SUPPLY — 63 items
BAG DECANTER FOR FLEXI CONT (MISCELLANEOUS) ×2 IMPLANT
BAG SPEC THK2 15X12 ZIP CLS (MISCELLANEOUS) ×1
BAG ZIPLOCK 12X15 (MISCELLANEOUS) ×2 IMPLANT
BANDAGE ELASTIC 6 VELCRO ST LF (GAUZE/BANDAGES/DRESSINGS) ×2 IMPLANT
BANDAGE ESMARK 6X9 LF (GAUZE/BANDAGES/DRESSINGS) ×1 IMPLANT
BLADE SAG 18X100X1.27 (BLADE) ×2 IMPLANT
BLADE SAW SGTL 11.0X1.19X90.0M (BLADE) ×2 IMPLANT
BNDG CMPR 9X6 STRL LF SNTH (GAUZE/BANDAGES/DRESSINGS) ×1
BNDG ESMARK 6X9 LF (GAUZE/BANDAGES/DRESSINGS) ×2
BOWL SMART MIX CTS (DISPOSABLE) ×2 IMPLANT
CAPT KNEE TOTAL 3 ATTUNE ×1 IMPLANT
CEMENT HV SMART SET (Cement) ×4 IMPLANT
CUFF TOURN SGL QUICK 34 (TOURNIQUET CUFF) ×2
CUFF TRNQT CYL 34X4X40X1 (TOURNIQUET CUFF) ×1 IMPLANT
DECANTER SPIKE VIAL GLASS SM (MISCELLANEOUS) ×2 IMPLANT
DRAPE EXTREMITY T 121X128X90 (DRAPE) ×2 IMPLANT
DRAPE POUCH INSTRU U-SHP 10X18 (DRAPES) ×2 IMPLANT
DRAPE U-SHAPE 47X51 STRL (DRAPES) ×2 IMPLANT
DRSG ADAPTIC 3X8 NADH LF (GAUZE/BANDAGES/DRESSINGS) ×2 IMPLANT
DRSG PAD ABDOMINAL 8X10 ST (GAUZE/BANDAGES/DRESSINGS) ×2 IMPLANT
DURAPREP 26ML APPLICATOR (WOUND CARE) ×2 IMPLANT
ELECT REM PT RETURN 9FT ADLT (ELECTROSURGICAL) ×2
ELECTRODE REM PT RTRN 9FT ADLT (ELECTROSURGICAL) ×1 IMPLANT
EVACUATOR 1/8 PVC DRAIN (DRAIN) ×2 IMPLANT
FACESHIELD WRAPAROUND (MASK) ×10 IMPLANT
FACESHIELD WRAPAROUND OR TEAM (MASK) ×5 IMPLANT
GAUZE SPONGE 4X4 12PLY STRL (GAUZE/BANDAGES/DRESSINGS) ×2 IMPLANT
GLOVE BIO SURGEON STRL SZ7.5 (GLOVE) IMPLANT
GLOVE BIO SURGEON STRL SZ8 (GLOVE) ×2 IMPLANT
GLOVE BIOGEL PI IND STRL 6.5 (GLOVE) IMPLANT
GLOVE BIOGEL PI IND STRL 8 (GLOVE) ×1 IMPLANT
GLOVE BIOGEL PI INDICATOR 6.5 (GLOVE)
GLOVE BIOGEL PI INDICATOR 8 (GLOVE) ×1
GLOVE SURG SS PI 6.5 STRL IVOR (GLOVE) IMPLANT
GOWN STRL REUS W/TWL LRG LVL3 (GOWN DISPOSABLE) ×2 IMPLANT
GOWN STRL REUS W/TWL XL LVL3 (GOWN DISPOSABLE) ×1 IMPLANT
HANDPIECE INTERPULSE COAX TIP (DISPOSABLE) ×2
IMMOBILIZER KNEE 20 (SOFTGOODS) ×3 IMPLANT
IMMOBILIZER KNEE 20 THIGH 36 (SOFTGOODS) ×1 IMPLANT
KIT BASIN OR (CUSTOM PROCEDURE TRAY) ×2 IMPLANT
MANIFOLD NEPTUNE II (INSTRUMENTS) ×2 IMPLANT
NDL SAFETY ECLIPSE 18X1.5 (NEEDLE) ×2 IMPLANT
NEEDLE HYPO 18GX1.5 SHARP (NEEDLE) ×4
NS IRRIG 1000ML POUR BTL (IV SOLUTION) ×2 IMPLANT
PACK TOTAL JOINT (CUSTOM PROCEDURE TRAY) ×2 IMPLANT
PADDING CAST COTTON 6X4 STRL (CAST SUPPLIES) ×4 IMPLANT
PEN SKIN MARKING BROAD (MISCELLANEOUS) ×2 IMPLANT
POSITIONER SURGICAL ARM (MISCELLANEOUS) ×2 IMPLANT
SET HNDPC FAN SPRY TIP SCT (DISPOSABLE) ×1 IMPLANT
STRIP CLOSURE SKIN 1/2X4 (GAUZE/BANDAGES/DRESSINGS) ×4 IMPLANT
SUCTION FRAZIER 12FR DISP (SUCTIONS) ×2 IMPLANT
SUT MNCRL AB 4-0 PS2 18 (SUTURE) ×2 IMPLANT
SUT VIC AB 2-0 CT1 27 (SUTURE) ×6
SUT VIC AB 2-0 CT1 TAPERPNT 27 (SUTURE) ×3 IMPLANT
SUT VLOC 180 0 24IN GS25 (SUTURE) ×2 IMPLANT
SYR 20CC LL (SYRINGE) ×2 IMPLANT
SYR 50ML LL SCALE MARK (SYRINGE) ×2 IMPLANT
TOWEL OR 17X26 10 PK STRL BLUE (TOWEL DISPOSABLE) ×2 IMPLANT
TOWEL OR NON WOVEN STRL DISP B (DISPOSABLE) IMPLANT
TRAY FOLEY CATH 14FRSI W/METER (CATHETERS) ×1 IMPLANT
WATER STERILE IRR 1500ML POUR (IV SOLUTION) ×2 IMPLANT
WRAP KNEE MAXI GEL POST OP (GAUZE/BANDAGES/DRESSINGS) ×2 IMPLANT
YANKAUER SUCT BULB TIP 10FT TU (MISCELLANEOUS) ×2 IMPLANT

## 2014-12-26 NOTE — Anesthesia Preprocedure Evaluation (Addendum)
Anesthesia Evaluation  Patient identified by MRN, date of birth, ID band Patient awake    Reviewed: Allergy & Precautions, NPO status , Patient's Chart, lab work & pertinent test results  Airway Mallampati: II  TM Distance: >3 FB Neck ROM: Full    Dental no notable dental hx.    Pulmonary former smoker,  breath sounds clear to auscultation  Pulmonary exam normal       Cardiovascular Exercise Tolerance: Good Rhythm:Regular Rate:Normal  ECHO: EF 45-50%   Neuro/Psych PSYCHIATRIC DISORDERS Depression TIACVA, No Residual Symptoms    GI/Hepatic Neg liver ROS, GERD-  ,  Endo/Other  negative endocrine ROS  Renal/GU negative Renal ROS  negative genitourinary   Musculoskeletal  (+) Arthritis -,   Abdominal (+) + obese,   Peds negative pediatric ROS (+)  Hematology negative hematology ROS (+)   Anesthesia Other Findings   Reproductive/Obstetrics negative OB ROS                          Anesthesia Physical Anesthesia Plan  ASA: III  Anesthesia Plan: General   Post-op Pain Management:    Induction: Intravenous  Airway Management Planned: Oral ETT  Additional Equipment:   Intra-op Plan:   Post-operative Plan: Extubation in OR  Informed Consent: I have reviewed the patients History and Physical, chart, labs and discussed the procedure including the risks, benefits and alternatives for the proposed anesthesia with the patient or authorized representative who has indicated his/her understanding and acceptance.   Dental advisory given  Plan Discussed with: CRNA  Anesthesia Plan Comments: (Spinal cord stimulator. Plan general. )       Anesthesia Quick Evaluation

## 2014-12-26 NOTE — Progress Notes (Signed)
Utilization review completed.  

## 2014-12-26 NOTE — Plan of Care (Signed)
Problem: Consults Goal: Diagnosis- Total Joint Replacement Right total knee     

## 2014-12-26 NOTE — Interval H&P Note (Signed)
History and Physical Interval Note:  12/26/2014 7:13 AM  Joseph Foster  has presented today for surgery, with the diagnosis of RIGHT KNEE OA   The various methods of treatment have been discussed with the patient and family. After consideration of risks, benefits and other options for treatment, the patient has consented to  Procedure(s): TOTAL RIGHT  KNEE ARTHROPLASTY (Right) as a surgical intervention .  The patient's history has been reviewed, patient examined, no change in status, stable for surgery.  I have reviewed the patient's chart and labs.  Questions were answered to the patient's satisfaction.     Gearlean Alf

## 2014-12-26 NOTE — Transfer of Care (Signed)
Immediate Anesthesia Transfer of Care Note  Patient: Joseph Foster  Procedure(s) Performed: Procedure(s) (LRB): TOTAL RIGHT  KNEE ARTHROPLASTY (Right)  Patient Location: PACU  Anesthesia Type: General  Level of Consciousness: sedated, patient cooperative and responds to stimulation  Airway & Oxygen Therapy: Patient Spontanous Breathing and Patient connected to face mask oxgen  Post-op Assessment: Report given to PACU RN and Post -op Vital signs reviewed and stable  Post vital signs: Reviewed and stable  Complications: No apparent anesthesia complications

## 2014-12-26 NOTE — Evaluation (Signed)
Physical Therapy Evaluation Patient Details Name: Joseph Foster MRN: [id number] DOB: 23-Feb-1946 Today's Date: 12/26/2014   History of Present Illness  RTKA  Clinical Impression  Patient  With noted shaking of  extremeties and trunk,  Different parts during rest and mobility, decreased balance ambulating. Patient will benefit from PT to address problems listed in note below.    Follow Up Recommendations Home health PT;Supervision/Assistance - 24 hour    Equipment Recommendations  Crutches;Rolling walker with 5" wheels    Recommendations for Other Services       Precautions / Restrictions Precautions Precautions: Fall;Knee Precaution Comments: patient experiencing shaking and tremors of  legs, arms  at times, uncontrollable. Required Braces or Orthoses: Knee Immobilizer - Right      Mobility  Bed Mobility Overal bed mobility: Needs Assistance Bed Mobility: Supine to Sit;Sit to Supine     Supine to sit: +2 for safety/equipment;Mod assist Sit to supine: +2 for safety/equipment;Mod assist   General bed mobility comments: cues for technique, slow down, assist with Legs .  Transfers Overall transfer level: Needs assistance Equipment used: Rolling walker (2 wheeled) Transfers: Sit to/from [facility] Sit to Stand: Mod assist;+2 physical assistance;+2 safety/equipment Stand pivot transfers: Mod assist;+2 physical assistance;+2 safety/equipment       General transfer comment: patient's trunk and legs trembling, support reqiired for safety, multimodal cues to push down through RW, at times lets go of RW.  Ambulation/Gait Ambulation/Gait assistance: +2 physical assistance;+2 safety/equipment;Mod assist Ambulation Distance (Feet): 25 Feet Assistive device: Rolling walker (2 wheeled) Gait Pattern/deviations: Step-to pattern;Decreased step length - right;Decreased step length - left     General Gait Details: trunk and  extremeties shaing and trembling. cues  to push  through RW.  Stairs            Wheelchair Mobility    Modified Rankin (Stroke Patients Only)       Balance Overall balance assessment: Needs assistance Sitting-balance support: Feet supported;Bilateral upper extremity supported Sitting balance-Leahy Scale: Fair     Standing balance support: During functional activity;Bilateral upper extremity supported Standing balance-Leahy Scale: Poor                               Pertinent Vitals/Pain Pain Assessment: 0-10 Pain Score: 5  Pain Location: L knee after walking Pain Descriptors / Indicators: Aching;Discomfort Pain Intervention(s): Premedicated before session;Repositioned;Ice applied;Monitored during session    Home Living Family/patient expects to be discharged to:: Private residence Living Arrangements: Spouse/significant other Available Help at Discharge: Family Type of Home: House Home Access: Stairs to enter Entrance Stairs-Rails: None Entrance Stairs-Number of Steps: 3 Home Layout: Two level Home Equipment: Cane - single point      Prior Function Level of Independence: Independent               Hand Dominance        Extremity/Trunk Assessment               Lower Extremity Assessment: RLE deficits/detail;LLE deficits/detail RLE Deficits / Details: able to lift leg LLE Deficits / Details: noted tremors/shaking of leg in bed and while walking  Cervical / Trunk Assessment:  (trunk tremors while walking)  Communication   Communication: No difficulties  Cognition Arousal/Alertness: Awake/alert Behavior During Therapy: WFL for tasks assessed/performed Overall Cognitive Status: Within Functional Limits for tasks assessed  General Comments      Exercises        Assessment/Plan    PT Assessment Patient needs continued PT services  PT Diagnosis Difficulty walking;Abnormality of gait;Acute pain   PT Problem List Decreased  strength;Decreased activity tolerance;Decreased range of motion;Decreased balance;Decreased mobility;Decreased knowledge of precautions;Decreased safety awareness;Decreased knowledge of use of DME;Pain  PT Treatment Interventions DME instruction;Gait training;Stair training;Functional mobility training;Therapeutic activities;Therapeutic exercise;Balance training;Patient/family education   PT Goals (Current goals can be found in the Care Plan section) Acute Rehab PT Goals Patient Stated Goal: to walk without pain PT Goal Formulation: With patient/family Time For Goal Achievement: 01/02/15 Potential to Achieve Goals: Good    Frequency 7X/week   Barriers to discharge        Co-evaluation               End of Session Equipment Utilized During Treatment: Gait belt Activity Tolerance: Patient limited by fatigue Patient left: in bed;with call bell/phone within reach;with family/visitor present Nurse Communication: Mobility status         Time: 2423-5361 PT Time Calculation (min) (ACUTE ONLY): 32 min   Charges:   PT Evaluation $Initial PT Evaluation Tier I: 1 Procedure PT Treatments $Gait Training: 8-22 mins   PT G Codes:        Claretha Cooper 12/26/2014, 6:48 PM Tresa Endo PT [phone]

## 2014-12-26 NOTE — Anesthesia Procedure Notes (Signed)
Procedure Name: Intubation Date/Time: 12/26/2014 8:12 AM Performed by: Lajuana Carry E Pre-anesthesia Checklist: Patient identified, Emergency Drugs available, Suction available and Patient being monitored Patient Re-evaluated:Patient Re-evaluated prior to inductionOxygen Delivery Method: Circle System Utilized Preoxygenation: Pre-oxygenation with 100% oxygen Intubation Type: IV induction Ventilation: Mask ventilation without difficulty Laryngoscope Size: Mac and 3 Grade View: Grade II Tube type: Oral Tube size: 7.5 mm Number of attempts: 2 (Large epiglottis) Airway Equipment and Method: Stylet and Oral airway Placement Confirmation: ETT inserted through vocal cords under direct vision,  positive ETCO2 and breath sounds checked- equal and bilateral Secured at: 21 cm Tube secured with: Tape Dental Injury: Teeth and Oropharynx as per pre-operative assessment  Comments: Limited view by CRNA, atraumatic intubation by Dr. Delma Post

## 2014-12-26 NOTE — Anesthesia Postprocedure Evaluation (Signed)
  Anesthesia Post-op Note  Patient: Joseph Foster  Procedure(s) Performed: Procedure(s) (LRB): TOTAL RIGHT  KNEE ARTHROPLASTY (Right)  Patient Location: PACU  Anesthesia Type: General  Level of Consciousness: awake and alert   Airway and Oxygen Therapy: Patient Spontanous Breathing  Post-op Pain: mild  Post-op Assessment: Post-op Vital signs reviewed, Patient's Cardiovascular Status Stable, Respiratory Function Stable, Patent Airway and No signs of Nausea or vomiting  Last Vitals:  Filed Vitals:   12/26/14 1511  BP: 130/85  Pulse: 92  Temp: 36.8 C  Resp: 16    Post-op Vital Signs: stable   Complications: No apparent anesthesia complications

## 2014-12-26 NOTE — Op Note (Signed)
Pre-operative diagnosis- Osteoarthritis  Right knee(s)  Post-operative diagnosis- Osteoarthritis Right knee(s)  Procedure-  Right  Total Knee Arthroplasty  Surgeon- Dione Plover. Eden Toohey, MD  Assistant- Molli Barrows, PA-C   Anesthesia-  General  EBL-* No blood loss amount entered *   Drains Hemovac  Tourniquet time-  Total Tourniquet Time Documented: Thigh (Right) - 37 minutes Total: Thigh (Right) - 37 minutes     Complications- None  Condition-PACU - hemodynamically stable.   Brief Clinical Note  Joseph Foster is a 69 y.o. year old male with end stage OA of his right knee with progressively worsening pain and dysfunction. He has constant pain, with activity and at rest and significant functional deficits with difficulties even with ADLs. He has had extensive non-op management including analgesics, injections of cortisone and viscosupplements, and home exercise program, but remains in significant pain with significant dysfunction. Radiographs show bone on bone arthritis medial and patellofemoral. He presents now for right Total Knee Arthroplasty.    Procedure in detail---   The patient is brought into the operating room and positioned supine on the operating table. After successful administration of  General,   a tourniquet is placed high on the  Right thigh(s) and the lower extremity is prepped and draped in the usual sterile fashion. Time out is performed by the operating team and then the  Right lower extremity is wrapped in Esmarch, knee flexed and the tourniquet inflated to 300 mmHg.       A midline incision is made with a ten blade through the subcutaneous tissue to the level of the extensor mechanism. A fresh blade is used to make a medial parapatellar arthrotomy. Soft tissue over the proximal medial tibia is subperiosteally elevated to the joint line with a knife and into the semimembranosus bursa with a Cobb elevator. Soft tissue over the proximal lateral tibia is elevated with  attention being paid to avoiding the patellar tendon on the tibial tubercle. The patella is everted, knee flexed 90 degrees and the ACL and PCL are removed. Findings are bone on bone arthritis medial and patellofemoral with large global osteophytes.        The drill is used to create a starting hole in the distal femur and the canal is thoroughly irrigated with sterile saline to remove the fatty contents. The 5 degree Right  valgus alignment guide is placed into the femoral canal and the distal femoral cutting block is pinned to remove 10 mm off the distal femur. Resection is made with an oscillating saw.      The tibia is subluxed forward and the menisci are removed. The extramedullary alignment guide is placed referencing proximally at the medial aspect of the tibial tubercle and distally along the second metatarsal axis and tibial crest. The block is pinned to remove 72mm off the more deficient medial  side. Resection is made with an oscillating saw. Size 8is the most appropriate size for the tibia and the proximal tibia is prepared with the modular drill and keel punch for that size.      The femoral sizing guide is placed and size 8 is most appropriate. Rotation is marked off the epicondylar axis and confirmed by creating a rectangular flexion gap at 90 degrees. The size 8 cutting block is pinned in this rotation and the anterior, posterior and chamfer cuts are made with the oscillating saw. The intercondylar block is then placed and that cut is made.      Trial size 8 tibial  component, trial size 8 posterior stabilized femur and a 6  mm posterior stabilized rotating platform insert trial is placed. Full extension is achieved with excellent varus/valgus and anterior/posterior balance throughout full range of motion. The patella is everted and thickness measured to be 27  mm. Free hand resection is taken to 15 mm, a 41 template is placed, lug holes are drilled, trial patella is placed, and it tracks normally.  Osteophytes are removed off the posterior femur with the trial in place. All trials are removed and the cut bone surfaces prepared with pulsatile lavage. Cement is mixed and once ready for implantation, the size 8 tibial implant, size  8 posterior stabilized femoral component, and the size 41 patella are cemented in place and the patella is held with the clamp. The trial insert is placed and the knee held in full extension. The Exparel (20 ml mixed with 30 ml saline) and .25% Bupivicaine, are injected into the extensor mechanism, posterior capsule, medial and lateral gutters and subcutaneous tissues.  All extruded cement is removed and once the cement is hard the permanent 6 mm posterior stabilized rotating platform insert is placed into the tibial tray.      The wound is copiously irrigated with saline solution and the extensor mechanism closed over a hemovac drain with #1 V-loc suture. The tourniquet is released for a total tourniquet time of 37  minutes. Flexion against gravity is 140 degrees and the patella tracks normally. Subcutaneous tissue is closed with 2.0 vicryl and subcuticular with running 4.0 Monocryl. The incision is cleaned and dried and steri-strips and a bulky sterile dressing are applied. The limb is placed into a knee immobilizer and the patient is awakened and transported to recovery in stable condition.      Please note that a surgical assistant was a medical necessity for this procedure in order to perform it in a safe and expeditious manner. Surgical assistant was necessary to retract the ligaments and vital neurovascular structures to prevent injury to them and also necessary for proper positioning of the limb to allow for anatomic placement of the prosthesis.   [name], MD    12/26/2014, 9:16 AM

## 2014-12-27 ENCOUNTER — Encounter (HOSPITAL_COMMUNITY): Payer: Self-pay | Admitting: Orthopedic Surgery

## 2014-12-27 LAB — BASIC METABOLIC PANEL
Anion gap: 8 (ref 5–15)
BUN: 19 mg/dL (ref 6–23)
CO2: 26 mmol/L (ref 19–32)
Calcium: 8.5 mg/dL (ref 8.4–10.5)
Chloride: 103 mmol/L (ref 96–112)
Creatinine, Ser: 1.14 mg/dL (ref 0.50–1.35)
GFR calc Af Amer: 76 mL/min — ABNORMAL LOW (ref >90)
GFR calc non Af Amer: 65 mL/min — ABNORMAL LOW (ref >90)
Glucose, Bld: 150 mg/dL — ABNORMAL HIGH (ref 70–99)
Potassium: 4.3 mmol/L (ref 3.5–5.1)
Sodium: 137 mmol/L (ref 135–145)

## 2014-12-27 LAB — CBC
HCT: 34.5 % — ABNORMAL LOW (ref 39.0–52.0)
Hemoglobin: 11.3 g/dL — ABNORMAL LOW (ref 13.0–17.0)
MCH: 30.1 pg (ref 26.0–34.0)
MCHC: 32.8 g/dL (ref 30.0–36.0)
MCV: 91.8 fL (ref 78.0–100.0)
Platelets: 183 10*3/uL (ref 150–400)
RBC: 3.76 MIL/uL — ABNORMAL LOW (ref 4.22–5.81)
RDW: 13.3 % (ref 11.5–15.5)
WBC: 13.5 10*3/uL — ABNORMAL HIGH (ref 4.0–10.5)

## 2014-12-27 MED ORDER — PANTOPRAZOLE SODIUM 40 MG PO TBEC
40.0000 mg | DELAYED_RELEASE_TABLET | Freq: Two times a day (BID) | ORAL | Status: DC
  Administered 2014-12-27 – 2014-12-28 (×3): 40 mg via ORAL
  Filled 2014-12-27 (×4): qty 1

## 2014-12-27 MED ORDER — HYDROMORPHONE HCL 4 MG PO TABS: 4.0000 mg | ORAL_TABLET | ORAL | Status: DC | PRN

## 2014-12-27 MED ORDER — RIVAROXABAN 10 MG PO TABS: 10.0000 mg | ORAL_TABLET | Freq: Every day | ORAL | Status: DC

## 2014-12-27 MED ORDER — METHOCARBAMOL 500 MG PO TABS: 500.0000 mg | ORAL_TABLET | Freq: Four times a day (QID) | ORAL | Status: DC | PRN

## 2014-12-27 MED ORDER — DOCUSATE SODIUM 100 MG PO CAPS: 100.0000 mg | ORAL_CAPSULE | Freq: Two times a day (BID) | ORAL | Status: DC

## 2014-12-27 NOTE — Progress Notes (Signed)
OT Cancellation Note  Patient Details Name: Joseph Foster MRN: [id number] DOB: August 27, 1946   Cancelled Treatment:    Reason Eval/Treat Not Completed: Pain limiting ability to participate Per nursing, pt just received pain meds and is currently at 9/10 pain. Will reattempt later time.  [place], [place] 12/27/2014, 12:20 PM

## 2014-12-27 NOTE — Progress Notes (Signed)
Physical Therapy Treatment Patient Details Name: Joseph Foster MRN: [id number] DOB: Jan 30, 1946 Today's Date: 12/27/2014    History of Present Illness RTKA    PT Comments    POD # 1 pm session.  Assisted out of recliner to amb in hallway a second time.  Attempted voiding while standing.  Assisted back to bed.   Follow Up Recommendations  Home health PT;Supervision/Assistance - 24 hour     Equipment Recommendations  Rolling walker with 5" wheels;Crutches    Recommendations for Other Services       Precautions / Restrictions Precautions Precautions: Fall;Knee Precaution Comments: instructed on KI use for amb Required Braces or Orthoses: Knee Immobilizer - Right Restrictions Weight Bearing Restrictions: No    Mobility  Bed Mobility Overal bed mobility: Needs Assistance Bed Mobility: Supine to Sit     Supine to sit: Min assist     General bed mobility comments: assist for R LE   Transfers Overall transfer level: Needs assistance Equipment used: Rolling walker (2 wheeled) Transfers: Sit to/from Stand Sit to Stand: Min assist;Mod assist         General transfer comment: increased time and 25% VC's on safety with turns  Ambulation/Gait Ambulation/Gait assistance: Min assist Ambulation Distance (Feet): 45 Feet Assistive device: Rolling walker (2 wheeled) Gait Pattern/deviations: Step-to pattern;Decreased stance time - left;Trunk flexed Gait velocity: decreased   General Gait Details: tolerated increased time   Science writer    Modified Rankin (Stroke Patients Only)       Balance                                    Cognition Arousal/Alertness: Awake/alert Behavior During Therapy: WFL for tasks assessed/performed Overall Cognitive Status: Within Functional Limits for tasks assessed                      Exercises      General Comments        Pertinent Vitals/Pain Pain Assessment:  0-10 Pain Score: 5  Pain Location: L knee Pain Descriptors / Indicators: Aching;Tightness;Tender Pain Intervention(s): Monitored during session;Premedicated before session;Repositioned;Ice applied    Home Living                      Prior Function            PT Goals (current goals can now be found in the care plan section) Progress towards PT goals: Progressing toward goals    Frequency  7X/week    PT Plan      Co-evaluation             End of Session Equipment Utilized During Treatment: Gait belt;Right knee immobilizer Activity Tolerance: Patient tolerated treatment well Patient left: in chair;with call bell/phone within reach     Time: 1238-1303 PT Time Calculation (min) (ACUTE ONLY): 25 min  Charges:  $Gait Training: 8-22 mins $Therapeutic Activity: 8-22 mins                    G Codes:      Rica Koyanagi  PTA [facility]  Acute  Rehab Pager      [phone]

## 2014-12-27 NOTE — Discharge Instructions (Addendum)
Dr. Gaynelle Arabian Total Joint Specialist [facility] [address] [place], [place] [address] [phone]  TOTAL KNEE REPLACEMENT POSTOPERATIVE DIRECTIONS    Knee Rehabilitation, Guidelines Following Surgery  Results after knee surgery are often greatly improved when you follow the exercise, range of motion and muscle strengthening exercises prescribed by your doctor. Safety measures are also important to protect the knee from further injury. Any time any of these exercises cause you to have increased pain or swelling in your knee joint, decrease the amount until you are comfortable again and slowly increase them. If you have problems or questions, call your caregiver or physical therapist for advice.   HOME CARE INSTRUCTIONS  Remove items at home which could result in a fall. This includes throw rugs or furniture in walking pathways.  Continue medications as instructed at time of discharge. You may have some home medications which will be placed on hold until you complete the course of blood thinner medication.  You may start showering once you are discharged home but do not submerge the incision under water. Just pat the incision dry and apply a dry gauze dressing on daily. Walk with walker as instructed.  You may resume a sexual relationship in one month or when given the OK by  your doctor.   Use walker as long as suggested by your caregivers.  Avoid periods of inactivity such as sitting longer than an hour when not asleep. This helps prevent blood clots.  Weight bearing as tolerated with assist device (walker, cane, etc) as directed, use it as long as suggested by your surgeon or therapist, typically at least 4-6 weeks.  You may return to work once you are cleared by your doctor.  Do not drive a car for 6 weeks or until released by you surgeon.   Do not drive while taking narcotics.  Wear the elastic stockings for three weeks following surgery during  the day but you may remove then at night. Make sure you keep all of your appointments after your operation with all of your doctors and caregivers. You should call the office at the above phone number and make an appointment for approximately two weeks after the date of your surgery. Change the dressing daily and reapply a dry dressing each time. Please pick up a stool softener and laxative for home use as long as you are requiring pain medications.  ICE to the affected knee every three hours for 30 minutes at a time and then as needed for pain and swelling.  Continue to use ice on the knee for pain and swelling from surgery. You may notice swelling that will progress down to the foot and ankle.  This is normal after surgery.  Elevate the leg when you are not up walking on it.   It is important for you to complete the blood thinner medication as prescribed by your doctor.  Continue to use the breathing machine which will help keep your temperature down.  It is common for your temperature to cycle up and down following surgery, especially at night when you are not up moving around and exerting yourself.  The breathing machine keeps your lungs expanded and your temperature down.  RANGE OF MOTION AND STRENGTHENING EXERCISES  Rehabilitation of the knee is important following a knee injury or an operation. After just a few days of immobilization, the muscles of the thigh which control the knee become weakened and shrink (atrophy). Knee exercises are designed to build  up the tone and strength of the thigh muscles and to improve knee motion. Often times heat used for twenty to thirty minutes before working out will loosen up your tissues and help with improving the range of motion but do not use heat for the first two weeks following surgery. These exercises can be done on a training (exercise) mat, on the floor, on a table or on a bed. Use what ever works the best and is most comfortable for you Knee exercises  include:  Leg Lifts - While your knee is still immobilized in a splint or cast, you can do straight leg raises. Lift the leg to 60 degrees, hold for 3 sec, and slowly lower the leg. Repeat 10-20 times 2-3 times daily. Perform this exercise against resistance later as your knee gets better.  Quad and Hamstring Sets - Tighten up the muscle on the front of the thigh (Quad) and hold for 5-10 sec. Repeat this 10-20 times hourly. Hamstring sets are done by pushing the foot backward against an object and holding for 5-10 sec. Repeat as with quad sets.  A rehabilitation program following serious knee injuries can speed recovery and prevent re-injury in the future due to weakened muscles. Contact your doctor or a physical therapist for more information on knee rehabilitation.   SKILLED REHAB INSTRUCTIONS: If the patient is transferred to a skilled rehab facility following release from the hospital, a list of the current medications will be sent to the facility for the patient to continue.  When discharged from the skilled rehab facility, please have the facility set up the patient's [place] prior to being released. Also, the skilled facility will be responsible for providing the patient with their medications at time of release from the facility to include their pain medication, the muscle relaxants, and their blood thinner medication. If the patient is still at the rehab facility at time of the two week follow up appointment, the skilled rehab facility will also need to assist the patient in arranging follow up appointment in our office and any transportation needs.  MAKE SURE YOU:  Understand these instructions.  Will watch your condition.  Will get help right away if you are not doing well or get worse.    Pick up stool softner and laxative for home use following surgery while on pain medications. Do not submerge incision under water. Please use good hand washing techniques while  changing dressing each day. May shower starting three days after surgery. Please use a clean towel to pat the incision dry following showers. Continue to use ice for pain and swelling after surgery. Do not use any lotions or creams on the incision until instructed by your surgeon.  Information on my medicine - XARELTO (Rivaroxaban)  This medication education was reviewed with me or my healthcare representative as part of my discharge preparation.  The pharmacist that spoke with me during my hospital stay was:  Kara Mead, [facility]  Why was Xarelto prescribed for you? Xarelto was prescribed for you to reduce the risk of blood clots forming after orthopedic surgery. The medical term for these abnormal blood clots is venous thromboembolism (VTE).  What do you need to know about xarelto ? Take your Xarelto ONCE DAILY at the same time every day. You may take it either with or without food.  If you have difficulty swallowing the tablet whole, you may crush it and mix in applesauce just prior to taking your dose.  Take  Xarelto exactly as prescribed by your doctor and DO NOT stop taking Xarelto without talking to the doctor who prescribed the medication.  Stopping without other VTE prevention medication to take the place of Xarelto may increase your risk of developing a clot.  After discharge, you should have regular check-up appointments with your healthcare provider that is prescribing your Xarelto.    What do you do if you miss a dose? If you miss a dose, take it as soon as you remember on the same day then continue your regularly scheduled once daily regimen the next day. Do not take two doses of Xarelto on the same day.   Important Safety Information A possible side effect of Xarelto is bleeding. You should call your healthcare provider right away if you experience any of the following: ? Bleeding from an injury or your nose that does not stop. ? Unusual colored urine (red  or dark brown) or unusual colored stools (red or black). ? Unusual bruising for unknown reasons. ? A serious fall or if you hit your head (even if there is no bleeding).  Some medicines may interact with Xarelto and might increase your risk of bleeding while on Xarelto. To help avoid this, consult your healthcare provider or pharmacist prior to using any new prescription or non-prescription medications, including herbals, vitamins, non-steroidal anti-inflammatory drugs (NSAIDs) and supplements.  This website has more information on Xarelto: [email or web address].[email or web address]/.   INSTRUCTIONS AFTER JOINT REPLACEMENT   Remove items at home which could result in a fall. This includes throw rugs or furniture in walking pathways ICE to the affected joint every three hours while awake for 30 minutes at a time, for at least the first 3-5 days, and then as needed for pain and swelling.  Continue to use ice for pain and swelling. You may notice swelling that will progress down to the foot and ankle.  This is normal after surgery.  Elevate your leg when you are not up walking on it.   Continue to use the breathing machine you got in the hospital (incentive spirometer) which will help keep your temperature down.  It is common for your temperature to cycle up and down following surgery, especially at night when you are not up moving around and exerting yourself.  The breathing machine keeps your lungs expanded and your temperature down.   DIET:  As you were doing prior to hospitalization, we recommend a well-balanced diet.  DRESSING / WOUND CARE / SHOWERING  You may change your dressing after surgery.  Then change the dressing every day with sterile gauze.  Please use good hand washing techniques before changing the dressing.  Do not use any lotions or creams on the incision until instructed by your surgeon. and You may shower 3 days after surgery, but keep the wounds dry during showering.  You may use an occlusive plastic  wrap (Press'n Seal for example), NO SOAKING/SUBMERGING IN THE BATHTUB.  If the bandage gets wet, change with a clean dry gauze.  If the incision gets wet, pat the wound dry with a clean towel.  ACTIVITY  Increase activity slowly as tolerated, but follow the weight bearing instructions below.   No driving for 6 weeks or until further direction given by your physician.  You cannot drive while taking narcotics.  No lifting or carrying greater than 10 lbs. until further directed by your surgeon. Avoid periods of inactivity such as sitting longer than an hour when not asleep. This helps prevent blood clots.  You may return to work once you are authorized by your doctor.     WEIGHT BEARING   Weight bearing as tolerated with assist device (walker, cane, etc) as directed, use it as long as suggested by your surgeon or therapist, typically at least 4-6 weeks.   EXERCISES  Results after joint replacement surgery are often greatly improved when you follow the exercise, range of motion and muscle strengthening exercises prescribed by your doctor. Safety measures are also important to protect the joint from further injury. Any time any of these exercises cause you to have increased pain or swelling, decrease what you are doing until you are comfortable again and then slowly increase them. If you have problems or questions, call your caregiver or physical therapist for advice.   Rehabilitation is important following a joint replacement. After just a few days of immobilization, the muscles of the leg can become weakened and shrink (atrophy).  These exercises are designed to build up the tone and strength of the thigh and leg muscles and to improve motion. Often times heat used for twenty to thirty minutes before working out will loosen up your tissues and help with improving the range of motion but do not use heat for the first two weeks following surgery (sometimes heat can increase post-operative swelling).     These exercises can be done on a training (exercise) mat, on the floor, on a table or on a bed. Use whatever works the best and is most comfortable for you.    Use music or television while you are exercising so that the exercises are a pleasant break in your day. This will make your life better with the exercises acting as a break in your routine that you can look forward to.   Perform all exercises about fifteen times, three times per day or as directed.  You should exercise both the operative leg and the other leg as well.   Exercises include:   Quad Sets - Tighten up the muscle on the front of the thigh (Quad) and hold for 5-10 seconds.   Straight Leg Raises - With your knee straight (if you were given a brace, keep it on), lift the leg to 60 degrees, hold for 3 seconds, and slowly lower the leg.  Perform this exercise against resistance later as your leg gets stronger.  Leg Slides: Lying on your back, slowly slide your foot toward your buttocks, bending your knee up off the floor (only go as far as is comfortable). Then slowly slide your foot back down until your leg is flat on the floor again.  Angel Wings: Lying on your back spread your legs to the side as far apart as you can without causing discomfort.  Hamstring Strength:  Lying on your back, push your heel against the floor with your leg straight by tightening up the muscles of your buttocks.  Repeat, but this time bend your knee to a comfortable angle, and push your heel against the floor.  You may put a pillow under the heel to make it more comfortable if necessary.   A rehabilitation program following joint replacement surgery can speed recovery and prevent re-injury in the future due to weakened muscles. Contact your doctor or a physical therapist for more information on knee rehabilitation.    CONSTIPATION  Constipation is defined medically as fewer than three stools per week and severe constipation as less than one stool per week.   Even if you have a regular bowel  pattern at home, your normal regimen is likely to be disrupted due to multiple reasons following surgery.  Combination of anesthesia, postoperative narcotics, change in appetite and fluid intake all can affect your bowels.   YOU MUST use at least one of the following options; they are listed in order of increasing strength to get the job done.  They are all available over the counter, and you may need to use some, POSSIBLY even all of these options:    Drink plenty of fluids (prune juice may be helpful) and high fiber foods Colace 100 mg by mouth twice a day  Senokot for constipation as directed and as needed Dulcolax (bisacodyl), take with full glass of water  Miralax (polyethylene glycol) once or twice a day as needed.  If you have tried all these things and are unable to have a bowel movement in the first 3-4 days after surgery call either your surgeon or your primary doctor.    If you experience loose stools or diarrhea, hold the medications until you stool forms back up.  If your symptoms do not get better within 1 week or if they get worse, check with your doctor.  If you experience "the worst abdominal pain ever" or develop nausea or vomiting, please contact the office immediately for further recommendations for treatment.   ITCHING:  If you experience itching with your medications, try taking only a single pain pill, or even half a pain pill at a time.  You can also use Benadryl over the counter for itching or also to help with sleep.   TED HOSE STOCKINGS:  Use stockings on both legs until for at least 2 weeks or as directed by physician office. They may be removed at night for sleeping.  MEDICATIONS:  See your medication summary on the After Visit Summary that nursing will review with you.  You may have some home medications which will be placed on hold until you complete the course of blood thinner medication.  It is important for you to complete the  blood thinner medication as prescribed.  PRECAUTIONS:  If you experience chest pain or shortness of breath - call 911 immediately for transfer to the hospital emergency department.   If you develop a fever greater that 101 F, purulent drainage from wound, increased redness or drainage from wound, foul odor from the wound/dressing, or calf pain - CONTACT YOUR SURGEON.                                                   FOLLOW-UP APPOINTMENTS:  If you do not already have a post-op appointment, please call the office for an appointment to be seen by your surgeon.  Guidelines for how soon to be seen are listed in your After Visit Summary, but are typically between 1-4 weeks after surgery.    MAKE SURE YOU:  Understand these instructions.  Get help right away if you are not doing well or get worse.    Thank you for letting us be a part of your medical care team.  It is a privilege we respect greatly.  We hope these instructions will help you stay on track for a fast and full recovery!

## 2014-12-27 NOTE — Progress Notes (Signed)
Physical Therapy Treatment Patient Details Name: Joseph Foster MRN: [id number] DOB: 01/23/1946 Today's Date: 12/27/2014    History of Present Illness RTKA    PT Comments    POD # 1 am session.  Applied KI and instructed on use for amb.  Assisted OOB to amb in hallway then performed TKR TE's followed by ICE.   Follow Up Recommendations  Home health PT;Supervision/Assistance - 24 hour     Equipment Recommendations  Rolling walker with 5" wheels;Crutches    Recommendations for Other Services       Precautions / Restrictions Precautions Precautions: Fall;Knee Precaution Comments: instructed on KI use for amb Required Braces or Orthoses: Knee Immobilizer - Right Restrictions Weight Bearing Restrictions: No    Mobility  Bed Mobility Overal bed mobility: Needs Assistance Bed Mobility: Supine to Sit     Supine to sit: Min assist     General bed mobility comments: assist for R LE   Transfers Overall transfer level: Needs assistance Equipment used: Rolling walker (2 wheeled) Transfers: Sit to/from Stand Sit to Stand: Min assist;Mod assist         General transfer comment: increased time and 25% VC's on safety with turns  Ambulation/Gait Ambulation/Gait assistance: Min assist Ambulation Distance (Feet): 45 Feet Assistive device: Rolling walker (2 wheeled) Gait Pattern/deviations: Step-to pattern;Decreased stance time - left;Trunk flexed Gait velocity: decreased   General Gait Details: tolerated increased time   Science writer    Modified Rankin (Stroke Patients Only)       Balance                                    Cognition Arousal/Alertness: Awake/alert Behavior During Therapy: WFL for tasks assessed/performed Overall Cognitive Status: Within Functional Limits for tasks assessed                      Exercises   Total Knee Replacement TE's 10 reps B LE ankle pumps 10 reps towel squeezes 10  reps knee presses 10 reps heel slides  10 reps SAQ's 10 reps SLR's 10 reps ABD Followed by ICE     General Comments        Pertinent Vitals/Pain Pain Assessment: 0-10 Pain Score: 5  Pain Location: L knee Pain Descriptors / Indicators: Aching;Tightness;Tender Pain Intervention(s): Monitored during session;Premedicated before session;Repositioned;Ice applied    Home Living                      Prior Function            PT Goals (current goals can now be found in the care plan section) Progress towards PT goals: Progressing toward goals    Frequency  7X/week    PT Plan      Co-evaluation             End of Session Equipment Utilized During Treatment: Gait belt;Right knee immobilizer Activity Tolerance: Patient tolerated treatment well Patient left: in chair;with call bell/phone within reach     Time: 0917-0941 PT Time Calculation (min) (ACUTE ONLY): 24 min  Charges:  $Gait Training: 8-22 mins $Therapeutic Exercise: 8-22 mins                    G Codes:      Rica Koyanagi  PTA [facility]  Acute  Rehab  Pager      [phone]

## 2014-12-27 NOTE — Care Management Note (Signed)
    Page 1 of 2   12/27/2014     3:22:53 PM CARE MANAGEMENT NOTE 12/27/2014  Patient:  Joseph Foster, Joseph Foster   Account Number:  [id number]  Date Initiated:  12/27/2014  Documentation initiated by:  [facility]  Subjective/Objective Assessment:   adm: TOTAL RIGHT  KNEE ARTHROPLASTY (Right)     Action/Plan:   discharge planning   Anticipated DC Date:  12/28/2014   Anticipated DC Plan:  [place]  CM consult      [facility] Choice  HOME HEALTH   Choice offered to / List presented to:  C-1 Patient   DME arranged  3-N-1  Vassie Moselle      DME agency  [place] arranged  Jay   Status of service:  Completed, signed off Medicare Important Message given?   (If response is "NO", the following Medicare IM given date fields will be blank) Date Medicare IM given:   Medicare IM given by:   Date Additional Medicare IM given:   Additional Medicare IM given by:    Discharge Disposition:  [facility]  Per UR Regulation:    If discussed at Long Length of Stay Meetings, dates discussed:    Comments:  12/27/14 15:00 CM met with pt in room to offer home health agency.  Pt chooses [facility] for HHPT.  Address and contact information verified by pt.  CM called [facility] DME rep Katie to please deliver 3n1 and rolloing walker to room prior to discharge.  Referral emailed to [employer], Tim. No other CM needs were communicated.  Mariane Masters, BSN, CM [phone].

## 2014-12-27 NOTE — Progress Notes (Addendum)
   Subjective: 1 Day Post-Op Procedure(s) (LRB): TOTAL RIGHT  KNEE ARTHROPLASTY (Right) Patient reports pain as moderate.   Patient seen in rounds with Dr. Wynelle Link. Patient is well, and has had no acute complaints or problems other than sleeping poorly and having some discomfort in the right knee. Also having some problems with heartburn. No SOB or chest pain.  Plan is to go Home after hospital stay.  Objective: Vital signs in last 24 hours: Temp:  [97.8 F (36.6 C)-99.4 F (37.4 C)] 98.5 F (36.9 C) (03/29 0435) Pulse Rate:  [74-92] 74 (03/29 0435) Resp:  [10-21] 16 (03/29 0435) BP: (126-173)/(72-100) 144/76 mmHg (03/29 0435) SpO2:  [96 %-100 %] 97 % (03/29 0435) Weight:  [115.667 kg (255 lb)] 115.667 kg (255 lb) (03/28 1040)  Intake/Output from previous day:  Intake/Output Summary (Last 24 hours) at 12/27/14 0737 Last data filed at 12/27/14 0600  Gross per 24 hour  Intake   4820 ml  Output   3500 ml  Net   1320 ml     Labs:  Recent Labs  12/27/14 0420  HGB 11.3*    Recent Labs  12/27/14 0420  WBC 13.5*  RBC 3.76*  HCT 34.5*  PLT 183    Recent Labs  12/27/14 0420  NA 137  K 4.3  CL 103  CO2 26  BUN 19  CREATININE 1.14  GLUCOSE 150*  CALCIUM 8.5    EXAM General - Patient is Alert and Oriented Extremity - Neurologically intact Intact pulses distally Dorsiflexion/Plantar flexion intact Compartment soft Dressing - dressing C/D/I until removal of drain and then had frank bloody drainage from drain site Motor Function - intact, moving foot and toes well on exam.  Hemovac pulled without difficulty.  Past Medical History  Diagnosis Date  . DDD (degenerative disc disease), lumbar L4  . Chronic back pain   . Arthritis KNEES  . Acute medial meniscus tear of left knee   . Depression   . Stroke     ? TIA   . GERD (gastroesophageal reflux disease)     Assessment/Plan: 1 Day Post-Op Procedure(s) (LRB): TOTAL RIGHT  KNEE ARTHROPLASTY  (Right) Principal Problem:   OA (osteoarthritis) of knee  Estimated body mass index is 32.73 kg/(m^2) as calculated from the following:   Height as of this encounter: 6\' 2"  (1.88 m).   Weight as of this encounter: 115.667 kg (255 lb). Advance diet Up with therapy D/C IV fluids when tolerating POs well  DVT Prophylaxis - Xarelto Weight-Bearing as tolerated  D/C O2 and Pulse OX and try on Room Air DC foley  Continue with therapy today. Will add protonix. If he continues to progress, plan for DC home tomorrow. Had to change and reinforce drain site after removal of hemovac  Ardeen Jourdain, PA-C Orthopaedic Surgery 12/27/2014, 7:37 AM

## 2014-12-28 LAB — CBC
HCT: 27.4 % — ABNORMAL LOW (ref 39.0–52.0)
Hemoglobin: 9 g/dL — ABNORMAL LOW (ref 13.0–17.0)
MCH: 30.3 pg (ref 26.0–34.0)
MCHC: 32.8 g/dL (ref 30.0–36.0)
MCV: 92.3 fL (ref 78.0–100.0)
Platelets: 170 10*3/uL (ref 150–400)
RBC: 2.97 MIL/uL — ABNORMAL LOW (ref 4.22–5.81)
RDW: 13.5 % (ref 11.5–15.5)
WBC: 12.5 10*3/uL — ABNORMAL HIGH (ref 4.0–10.5)

## 2014-12-28 LAB — BASIC METABOLIC PANEL
Anion gap: 8 (ref 5–15)
BUN: 20 mg/dL (ref 6–23)
CO2: 25 mmol/L (ref 19–32)
Calcium: 8.1 mg/dL — ABNORMAL LOW (ref 8.4–10.5)
Chloride: 99 mmol/L (ref 96–112)
Creatinine, Ser: 1.16 mg/dL (ref 0.50–1.35)
GFR calc Af Amer: 74 mL/min — ABNORMAL LOW (ref >90)
GFR calc non Af Amer: 64 mL/min — ABNORMAL LOW (ref >90)
Glucose, Bld: 134 mg/dL — ABNORMAL HIGH (ref 70–99)
Potassium: 3.7 mmol/L (ref 3.5–5.1)
Sodium: 132 mmol/L — ABNORMAL LOW (ref 135–145)

## 2014-12-28 MED ORDER — FERROUS SULFATE 325 (65 FE) MG PO TABS: 325.0000 mg | ORAL_TABLET | Freq: Three times a day (TID) | ORAL | Status: DC

## 2014-12-28 MED ORDER — LIDOCAINE HCL 2 % EX GEL
1.0000 "application " | Freq: Once | CUTANEOUS | Status: DC
  Filled 2014-12-28: qty 5

## 2014-12-28 MED ORDER — LIDOCAINE HCL 2 % EX GEL
Freq: Once | CUTANEOUS | Status: AC
  Administered 2014-12-28: 08:00:00 via URETHRAL
  Filled 2014-12-28: qty 5

## 2014-12-28 MED ORDER — FERROUS SULFATE 325 (65 FE) MG PO TABS
325.0000 mg | ORAL_TABLET | Freq: Three times a day (TID) | ORAL | Status: DC
  Administered 2014-12-28: 325 mg via ORAL
  Filled 2014-12-28 (×3): qty 1

## 2014-12-28 NOTE — Consult Note (Signed)
Urology Consult   Physician requesting consult: Dr. Wynelle Link  Reason for consult: Urinary retention  History of Present Illness: Joseph Foster is a 69 y.o. male s/p right total knee arthroplasty with post-operative urinary retention. Was in retention last night with successful I&O catheter placed with 374mL out. Unable to void after that and nursing staff subsequently unable to place I&O catheter on several attempts. Bladder scan > 500 mL. He is on flomax at home. He was only able to urinate about 100 mL this morning.   Denies a history of voiding or storage urinary symptoms, hematuria, UTIs, STDs, urolithiasis, GU malignancy/trauma/surgery.  Past Medical History  Diagnosis Date  . DDD (degenerative disc disease), lumbar L4  . Chronic back pain   . Arthritis KNEES  . Acute medial meniscus tear of left knee   . Depression   . Stroke     ? TIA   . GERD (gastroesophageal reflux disease)     Past Surgical History  Procedure Laterality Date  . Spinal cord stimulator implant  2009  . Right knee surg.  2005  . Knee arthroscopy  01/15/2012    Procedure: ARTHROSCOPY KNEE;  Surgeon: Gearlean Alf, MD;  Location: [facility];  Service: Orthopedics;  Laterality: Left;  meniscal debridement  . Tonsilectomy/adenoidectomy with myringotomy    . Vasectomy    . Hc eeg adult  08/23/2014       . Total knee arthroplasty Right 12/26/2014    Procedure: TOTAL RIGHT  KNEE ARTHROPLASTY;  Surgeon: Gaynelle Arabian, MD;  Location: [facility];  Service: Orthopedics;  Laterality: Right;    Medications:  Home meds:    Medication List    STOP taking these medications        aspirin EC 325 MG tablet     cyclobenzaprine 10 MG tablet  Commonly known as:  FLEXERIL     multivitamin tablet      TAKE these medications        ALPRAZolam 0.5 MG tablet  Commonly known as:  XANAX  Take 0.5 mg by mouth 2 (two) times daily as needed for anxiety.     docusate sodium 100 MG capsule  Commonly  known as:  COLACE  Take 1 capsule (100 mg total) by mouth 2 (two) times daily.     ferrous sulfate 325 (65 FE) MG tablet  Take 1 tablet (325 mg total) by mouth 3 (three) times daily.     HYDROmorphone 4 MG tablet  Commonly known as:  DILAUDID  Take 1-2 tablets (4-8 mg total) by mouth every 3 (three) hours as needed for severe pain.     methocarbamol 500 MG tablet  Commonly known as:  ROBAXIN  Take 1 tablet (500 mg total) by mouth every 6 (six) hours as needed for muscle spasms.     morphine 30 MG 12 hr tablet  Commonly known as:  MS CONTIN  Take 30 mg by mouth every 12 (twelve) hours.     morphine 15 MG tablet  Commonly known as:  MSIR  Take 15 mg by mouth every 4 (four) hours as needed for moderate pain or severe pain.     PARoxetine 10 MG tablet  Commonly known as:  PAXIL  Take 40 mg by mouth every morning.     rivaroxaban 10 MG Tabs tablet  Commonly known as:  XARELTO  Take 1 tablet (10 mg total) by mouth daily with breakfast.     simvastatin 20 MG tablet  Commonly  known as:  ZOCOR  Take 1 tablet (20 mg total) by mouth daily at 6 PM.     sodium chloride 0.65 % Soln nasal spray  Commonly known as:  OCEAN  Place 1 spray into both nostrils as needed for congestion.     tamsulosin 0.4 MG Caps capsule  Commonly known as:  FLOMAX  Take 0.4 mg by mouth daily.        Scheduled Meds: . dexamethasone  10 mg Intravenous Once  . docusate sodium  100 mg Oral BID  . ferrous sulfate  325 mg Oral TID  . morphine  30 mg Oral Q12H  . pantoprazole  40 mg Oral BID  . PARoxetine  40 mg Oral Daily  . rivaroxaban  10 mg Oral Q breakfast  . simvastatin  20 mg Oral q1800  . tamsulosin  0.4 mg Oral Daily   Continuous Infusions: . dextrose 5 % and 0.9% NaCl Stopped (12/27/14 0802)   PRN Meds:.acetaminophen **OR** acetaminophen, ALPRAZolam, bisacodyl, diphenhydrAMINE, HYDROmorphone (DILAUDID) injection, HYDROmorphone, menthol-cetylpyridinium **OR** phenol, methocarbamol **OR**  methocarbamol (ROBAXIN)  IV, metoCLOPramide **OR** metoCLOPramide (REGLAN) injection, morphine, ondansetron **OR** ondansetron (ZOFRAN) IV, polyethylene glycol  Allergies:  Allergies  Allergen Reactions  . Ceclor [Cefaclor] Itching  . Codeine     Hyper   . Penicillins Hives  . Oxycontin [Oxycodone Hcl] Rash    Family History  Problem Relation Age of Onset  . Heart disease Father   . Leukemia Mother   . Hyperlipidemia Mother     Social History:  reports that he quit smoking about 13 years ago. His smoking use included Cigars. His smokeless tobacco use includes Snuff. He reports that he drinks about 12.6 oz of alcohol per week. He reports that he does not use illicit drugs.  ROS: A complete review of systems was performed.  All systems are negative except for pertinent findings as noted.  Physical Exam:  Vital signs in last 24 hours: Temp:  [98 F (36.7 C)-99.6 F (37.6 C)] 98.7 F (37.1 C) (03/30 0601) Pulse Rate:  [77-115] 115 (03/30 0601) Resp:  [16] 16 (03/30 0601) BP: (123-131)/(65-90) 131/84 mmHg (03/30 0601) SpO2:  [95 %-98 %] 98 % (03/30 0601) Constitutional:  Alert and oriented, No acute distress Cardiovascular: Regular rate and rhythm, No JVD Respiratory: Normal respiratory effort, Lungs clear bilaterally GI: Abdomen is soft, nontender, nondistended, no abdominal masses Genitourinary: No CVAT. Normal male phallus, testes are descended bilaterally and non-tender and without masses, scrotum is normal in appearance without lesions or masses, perineum is normal on inspection. Lymphatic: No lymphadenopathy Neurologic: Grossly intact, no focal deficits Psychiatric: Normal mood and affect  Laboratory Data:   Recent Labs  12/27/14 0420 12/28/14 0442  WBC 13.5* 12.5*  HGB 11.3* 9.0*  HCT 34.5* 27.4*  PLT 183 170     Recent Labs  12/27/14 0420 12/28/14 0442  NA 137 132*  K 4.3 3.7  CL 103 99  GLUCOSE 150* 134*  BUN 19 20  CALCIUM 8.5 8.1*  CREATININE  1.14 1.16    Procedure: After proper identification of the patient, sterile technique was used to prep and drape the patient. An 18Fr Coude was placed without difficulty with immediate return of 600 cc of clear yellow urine. There was some resistance at the prostate and sphincter, but otherwise normal catheter placement. 10 cc of sterile water were inflated into the balloon and the catheter was hooked to a drainage bag.  Radiologic Imaging: No results found.  Impression/Recommendation [phone] with  history of BPH with post-op urinary retention. Foley placed without difficulty. Would recommend keeping foley until mid next week. We will call with this appointment. He does not have a urologist.

## 2014-12-28 NOTE — Progress Notes (Signed)
   Subjective: 2 Days Post-Op Procedure(s) (LRB): TOTAL RIGHT  KNEE ARTHROPLASTY (Right) Patient reports pain as mild.   Patient seen in rounds with Dr. Wynelle Link. Patient is having problems with urinary retention. after the foley removal yesterday he was unable to void. I&O cath performed initially. Unfortunately he was still unable to void and further attempts at I&O and caude caths were unsuccessful. No SOB or chest pain.  Plan is to go Home after hospital stay.  Objective: Vital signs in last 24 hours: Temp:  [98 F (36.7 C)-99.6 F (37.6 C)] 98.7 F (37.1 C) (03/30 0601) Pulse Rate:  [77-115] 115 (03/30 0601) Resp:  [16] 16 (03/30 0601) BP: (123-131)/(65-90) 131/84 mmHg (03/30 0601) SpO2:  [95 %-98 %] 98 % (03/30 0601)  Intake/Output from previous day:  Intake/Output Summary (Last 24 hours) at 12/28/14 0652 Last data filed at 12/28/14 0601  Gross per 24 hour  Intake    120 ml  Output    350 ml  Net   -230 ml     Labs:  Recent Labs  12/27/14 0420 12/28/14 0442  HGB 11.3* 9.0*    Recent Labs  12/27/14 0420 12/28/14 0442  WBC 13.5* 12.5*  RBC 3.76* 2.97*  HCT 34.5* 27.4*  PLT 183 170    Recent Labs  12/27/14 0420 12/28/14 0442  NA 137 132*  K 4.3 3.7  CL 103 99  CO2 26 25  BUN 19 20  CREATININE 1.14 1.16  GLUCOSE 150* 134*  CALCIUM 8.5 8.1*    EXAM General - Patient is Alert and Oriented Extremity - Neurologically intact Intact pulses distally Dorsiflexion/Plantar flexion intact Compartment soft Dressing/Incision - clean, dry, no drainage Motor Function - intact, moving foot and toes well on exam.   Past Medical History  Diagnosis Date  . DDD (degenerative disc disease), lumbar L4  . Chronic back pain   . Arthritis KNEES  . Acute medial meniscus tear of left knee   . Depression   . Stroke     ? TIA   . GERD (gastroesophageal reflux disease)     Assessment/Plan: 2 Days Post-Op Procedure(s) (LRB): TOTAL RIGHT  KNEE ARTHROPLASTY  (Right) Principal Problem:   OA (osteoarthritis) of knee  Estimated body mass index is 32.73 kg/(m^2) as calculated from the following:   Height as of this encounter: 6\' 2"  (1.88 m).   Weight as of this encounter: 115.667 kg (255 lb). Advance diet Up with therapy  DVT Prophylaxis - Xarelto Weight-Bearing as tolerated   He is doing well in regards to his knee. Drainage from hemovac site ceased. Will have urology attempt cath placement and consult. If patient able to demonstrate ability to void today, possible DC home. Will wait if not able to void.   Ardeen Jourdain, PA-C Orthopaedic Surgery 12/28/2014, 6:52 AM

## 2014-12-28 NOTE — Progress Notes (Signed)
Patient was I&O cath yesterday at approximately 1800. Patient made several attempts to void last night was was unable to. No complaints of discomfort voiced. At 0352 bladder scan done for 485cc. Attempted to I&O cath times two with no success. Spoke with PA Shuford and order received for caude cath. At approximately 0540 another nurse attempted to use a smaller cath again with no success. A caude cath was used unsuccessfully.Bladder scan done for over 500cc. Spoke with PA Shuford at approximately [phone] re-inability to cath. Dr Wynelle Link currently making rounds and he was updated on situation. Wants staff to try later this morning and if no success, urology will be contacted.

## 2014-12-28 NOTE — Progress Notes (Signed)
Patient discharged home.  Reviewed discharge paperwork with wife and patient.  Both verbalized understanding.  Prescriptions given to wife.  Reviewed urinary catheter that patient is d/c'd with due to urinary retention, taught wife how to empty bag and change if needed.  Wife verbalized understanding.  Patient escorted from floor with NT via wheelchair.  Christen Bame RN

## 2014-12-28 NOTE — Progress Notes (Signed)
Physical Therapy Treatment Patient Details Name: Joseph Foster MRN: [id number] DOB: 1945/12/24 Today's Date: 12/28/2014    History of Present Illness RTKA    PT Comments    POD # 2 pm session.  Practiced stairs again and performed TKR TE's following HEP handout followed by ICE.  Pt ready for D/C to home.  Follow Up Recommendations  Home health PT     Equipment Recommendations  Rolling walker with 5" wheels;Crutches    Recommendations for Other Services       Precautions / Restrictions Precautions Precautions: Fall;Knee Precaution Comments: instructed pt and spouse on KI use for amb and esp stairs Required Braces or Orthoses: Knee Immobilizer - Right Restrictions Weight Bearing Restrictions: No Other Position/Activity Restrictions: WBAT    Mobility  Bed Mobility Overal bed mobility: Needs Assistance Bed Mobility: Supine to Sit     Supine to sit: Min guard     General bed mobility comments: Pt OOB in recliner  Transfers Overall transfer level: Needs assistance Equipment used: Rolling walker (2 wheeled) Transfers: Sit to/from Stand Sit to Stand: Min guard;Supervision         General transfer comment: verbal cues for safety and hand placement.  Ambulation/Gait Ambulation/Gait assistance: Supervision;Min guard Ambulation Distance (Feet): 65 Feet Assistive device: Rolling walker (2 wheeled) Gait Pattern/deviations: Step-to pattern;Decreased step length - left;Trunk flexed Gait velocity: decreased   General Gait Details: had spouse assist pt with amb under direction   Stairs Stairs: Yes Stairs assistance: Mod assist Stair Management: No rails;Step to pattern;Forwards;With crutches Number of Stairs: 4 General stair comments: practiced stairs again this time no rails same as pt's enterance.  Attempted with B crutches however pt was too unsteady so performed with RW with spouse.  Spouse unterstands she will need extra help to get pt into home today.     Wheelchair Mobility    Modified Rankin (Stroke Patients Only)       Balance                                    Cognition Arousal/Alertness: Awake/alert Behavior During Therapy: WFL for tasks assessed/performed Overall Cognitive Status: Within Functional Limits for tasks assessed                      Exercises   Total Knee Replacement TE's 10 reps B LE ankle pumps 10 reps towel squeezes 10 reps knee presses 10 reps heel slides  10 reps SAQ's 10 reps SLR's 10 reps ABD Followed by ICE     General Comments        Pertinent Vitals/Pain Pain Assessment: 0-10 Pain Score: 7  Pain Location: R knee Pain Descriptors / Indicators: Sore;Aching Pain Intervention(s): Monitored during session;Premedicated before session;Repositioned;Ice applied    Home Living Family/patient expects to be discharged to:: Private residence Living Arrangements: Spouse/significant other Available Help at Discharge: Family Type of Home: House Home Access: Stairs to enter Entrance Stairs-Rails: None Home Layout: Two level Home Equipment: Cane - single point;Bedside commode      Prior Function Level of Independence: Independent          PT Goals (current goals can now be found in the care plan section) Acute Rehab PT Goals Patient Stated Goal: home Progress towards PT goals: Progressing toward goals    Frequency  7X/week    PT Plan      Co-evaluation  End of Session Equipment Utilized During Treatment: Gait belt;Right knee immobilizer Activity Tolerance: Patient tolerated treatment well Patient left: in chair;with call bell/phone within reach     Time: 8638-1771 PT Time Calculation (min) (ACUTE ONLY): 30 min  Charges:  $Gait Training: 8-22 mins $Therapeutic Exercise: 8-22 mins                     G Codes:      Nathanial Rancher 01/24/2015, 2:38 PM

## 2014-12-28 NOTE — Evaluation (Addendum)
Occupational Therapy Evaluation Patient Details Name: ODYN TURKO MRN: [id number] DOB: [birth year] Today's Date: 12/28/2014    History of Present Illness RTKA   Clinical Impression   Pt doing well. Practiced 3in1 transfer and educated on shower transfers and practiced this also. Pt states he may sponge bathe for a few more days before showering. Overall doing well. Will follow while on acute to progress ADL and ok to d/c from OT standpoint today.    Follow Up Recommendations  No OT follow up;Supervision/Assistance - 24 hour    Equipment Recommendations  None recommended by OT    Recommendations for Other Services       Precautions / Restrictions Precautions Precautions: Fall;Knee Precaution Comments: educated on KI wear Required Braces or Orthoses: Knee Immobilizer - Right Restrictions Weight Bearing Restrictions: No      Mobility Bed Mobility Overal bed mobility: Needs Assistance Bed Mobility: Supine to Sit     Supine to sit: Min guard        Transfers Overall transfer level: Needs assistance Equipment used: Rolling walker (2 wheeled) Transfers: Sit to/from Stand Sit to Stand: Min assist         General transfer comment: verbal cues for safety and hand placement.    Balance                                            ADL Overall ADL's : Needs assistance/impaired Eating/Feeding: Independent;Sitting   Grooming: Wash/dry hands;Min guard;Standing   Upper Body Bathing: Set up;Sitting   Lower Body Bathing: Moderate assistance;Sit to/from stand   Upper Body Dressing : Set up;Sitting   Lower Body Dressing: Moderate assistance;Sit to/from stand   Toilet Transfer: Minimal assistance;Ambulation;BSC;RW   Toileting- Clothing Manipulation and Hygiene: Minimal assistance;Sit to/from stand   Tub/ Shower Transfer: Walk-in shower;Minimal assistance;Rolling walker     General ADL Comments: Practiced shower transfer and educated on  placement of 3in1 in shower and use of KI when stepping in and out of shower. pt states he may sponge bathe for a few days which would be a good option to get a little more strength before showering and pain controlled. Practiced on and off 3in1 and pt did well. Wife can assist with LB dressing per pt.     Vision     Perception     Praxis      Pertinent Vitals/Pain Pain Assessment: 0-10 Pain Score: 6  Pain Location: R knee Pain Descriptors / Indicators: Aching Pain Intervention(s): Repositioned;Ice applied     Hand Dominance     Extremity/Trunk Assessment Upper Extremity Assessment Upper Extremity Assessment: Overall WFL for tasks assessed           Communication Communication Communication: No difficulties   Cognition Arousal/Alertness: Awake/alert Behavior During Therapy: WFL for tasks assessed/performed Overall Cognitive Status: Within Functional Limits for tasks assessed                     General Comments       Exercises       Shoulder Instructions      Home Living Family/patient expects to be discharged to:: Private residence Living Arrangements: Spouse/significant other Available Help at Discharge: Family Type of Home: House Home Access: Stairs to enter Technical brewer of Steps: 3 Entrance Stairs-Rails: None Home Layout: Two level Alternate Level Stairs-Number of Steps: 15 Alternate Level Stairs-Rails: Left Bathroom  Shower/Tub: Occupational psychologist: Handicapped height     Home Equipment: [place] - single point;Bedside commode          Prior Functioning/Environment Level of Independence: Independent             OT Diagnosis: Generalized weakness   OT Problem List: Decreased strength;Decreased knowledge of use of DME or AE   OT Treatment/Interventions: Self-care/ADL training;Patient/family education;Therapeutic activities;DME and/or AE instruction    OT Goals(Current goals can be found in the care plan  section) Acute Rehab OT Goals Patient Stated Goal: home OT Goal Formulation: With patient Time For Goal Achievement: 01/04/15 Potential to Achieve Goals: Good  OT Frequency: Min 2X/week   Barriers to D/C:            Co-evaluation              End of Session Equipment Utilized During Treatment: Gait belt;Rolling walker  Activity Tolerance: Patient tolerated treatment well Patient left: in chair;with call bell/phone within reach   Time: 0906-0944 OT Time Calculation (min): 38 min Charges:  OT General Charges $OT Visit: 1 Procedure OT Evaluation $Initial OT Evaluation Tier I: 1 Procedure OT Treatments $Self Care/Home Management : 8-22 mins $Therapeutic Activity: 8-22 mins G-Codes:    Jules Schick  [phone] 12/28/2014, 12:14 PM

## 2014-12-28 NOTE — Progress Notes (Signed)
Physical Therapy Treatment Patient Details Name: Joseph Foster MRN: [id number] DOB: [birth year] Today's Date: 12/28/2014    History of Present Illness RTKA    PT Comments    POD # 2 am session.  Assisted pt out of recliner to amb in hallway with spouse with direction on safe handling and use of AD.  Practiced stairs with one rail and one crutch with spouse.  Mild unsteady with tremors throughout which increase with exertion.  Pt plans to D/C to home later today.  Follow Up Recommendations  Home health PT     Equipment Recommendations  Rolling walker with 5" wheels;Crutches    Recommendations for Other Services       Precautions / Restrictions Precautions Precautions: Fall;Knee Precaution Comments: instructed pt and spouse on KI use for amb and esp stairs Required Braces or Orthoses: Knee Immobilizer - Right Restrictions Weight Bearing Restrictions: No Other Position/Activity Restrictions: WBAT    Mobility  Bed Mobility Overal bed mobility: Needs Assistance Bed Mobility: Supine to Sit     Supine to sit: Min guard     General bed mobility comments: Pt OOB in recliner  Transfers Overall transfer level: Needs assistance Equipment used: Rolling walker (2 wheeled) Transfers: Sit to/from Stand Sit to Stand: Min guard;Supervision         General transfer comment: verbal cues for safety and hand placement.  Ambulation/Gait Ambulation/Gait assistance: Supervision;Min guard Ambulation Distance (Feet): 65 Feet Assistive device: Rolling walker (2 wheeled) Gait Pattern/deviations: Step-to pattern;Decreased step length - left;Trunk flexed Gait velocity: decreased   General Gait Details: had spouse assist pt with amb under direction   Stairs Stairs: Yes Stairs assistance: Mod assist Stair Management: One rail Right;Step to pattern;Forwards;With crutches Number of Stairs: 4 General stair comments: performed 4 steps with one rail and one crutch.  75% VC's on proper  tech and safety.  Performed with spouse for education.  Wheelchair Mobility    Modified Rankin (Stroke Patients Only)       Balance                                    Cognition Arousal/Alertness: Awake/alert Behavior During Therapy: WFL for tasks assessed/performed Overall Cognitive Status: Within Functional Limits for tasks assessed                      Exercises      General Comments        Pertinent Vitals/Pain Pain Assessment: 0-10 Pain Score: 7  Pain Location: R knee Pain Descriptors / Indicators: Sore;Aching Pain Intervention(s): Monitored during session;Premedicated before session;Repositioned;Ice applied    Home Living Family/patient expects to be discharged to:: Private residence Living Arrangements: Spouse/significant other Available Help at Discharge: Family Type of Home: House Home Access: Stairs to enter Entrance Stairs-Rails: None Home Layout: Two level Home Equipment: Cane - single point;Bedside commode      Prior Function Level of Independence: Independent          PT Goals (current goals can now be found in the care plan section) Acute Rehab PT Goals Patient Stated Goal: home Progress towards PT goals: Progressing toward goals    Frequency  7X/week    PT Plan      Co-evaluation             End of Session Equipment Utilized During Treatment: Gait belt;Right knee immobilizer Activity Tolerance: Patient tolerated treatment well Patient  left: in chair;with call bell/phone within reach     Time: 1025-1055 PT Time Calculation (min) (ACUTE ONLY): 30 min  Charges:  $Gait Training: 8-22 mins $Therapeutic Activity: 8-22 mins                    G Codes:      Joseph Foster  PTA [facility]  Acute  Rehab Pager      [phone]

## 2014-12-30 NOTE — Discharge Summary (Signed)
Physician Discharge Summary   Patient ID: Joseph Foster MRN: [id number] DOB/AGE: Feb 27, 1946 69 y.o.  Admit date: 12/26/2014 Discharge date: 12/28/2014  Primary Diagnosis: Primary osteoarthritis right knee  Admission Diagnoses:  Past Medical History  Diagnosis Date  . DDD (degenerative disc disease), lumbar L4  . Chronic back pain   . Arthritis KNEES  . Acute medial meniscus tear of left knee   . Depression   . Stroke     ? TIA   . GERD (gastroesophageal reflux disease)    Discharge Diagnoses:   Principal Problem:   OA (osteoarthritis) of knee  Estimated body mass index is 32.73 kg/(m^2) as calculated from the following:   Height as of this encounter: 6' 2"  (1.88 m).   Weight as of this encounter: 115.667 kg (255 lb).  Procedure:  Procedure(s) (LRB): TOTAL RIGHT  KNEE ARTHROPLASTY (Right)   Consults: urology  HPI: Joseph Foster, 69 y.o. male, has a history of pain and functional disability in the right knee due to arthritis and has failed non-surgical conservative treatments for greater than 12 weeks to includeNSAID's and/or analgesics, corticosteriod injections and activity modification. Onset of symptoms was gradual, starting 5 years ago with gradually worsening course since that time. The patient noted prior procedures on the knee to include arthroscopy and menisectomy on the right knee(s). Patient currently rates pain in the right knee(s) at 7 out of 10 with activity. Patient has night pain, worsening of pain with activity and weight bearing, pain that interferes with activities of daily living, pain with passive range of motion, crepitus and joint swelling. Patient has evidence of periarticular osteophytes and joint space narrowing by imaging studies. There is no active infection.  Laboratory Data: Admission on 12/26/2014, Discharged on 12/28/2014  Component Date Value Ref Range Status  . ABO/RH(D) 12/26/2014 O POS   Final  . Antibody Screen 12/26/2014 NEG    Final  . Sample Expiration 12/26/2014 12/29/2014   Final  . ABO/RH(D) 12/26/2014 O POS   Final  . WBC 12/27/2014 13.5* 4.0 - 10.5 K/uL Final  . RBC 12/27/2014 3.76* 4.22 - 5.81 MIL/uL Final  . Hemoglobin 12/27/2014 11.3* 13.0 - 17.0 g/dL Final  . HCT 12/27/2014 34.5* 39.0 - 52.0 % Final  . MCV 12/27/2014 91.8  78.0 - 100.0 fL Final  . MCH 12/27/2014 30.1  26.0 - 34.0 pg Final  . MCHC 12/27/2014 32.8  30.0 - 36.0 g/dL Final  . RDW 12/27/2014 13.3  11.5 - 15.5 % Final  . Platelets 12/27/2014 183  150 - 400 K/uL Final  . Sodium 12/27/2014 137  135 - 145 mmol/L Final  . Potassium 12/27/2014 4.3  3.5 - 5.1 mmol/L Final  . Chloride 12/27/2014 103  96 - 112 mmol/L Final  . CO2 12/27/2014 26  19 - 32 mmol/L Final  . Glucose, Bld 12/27/2014 150* 70 - 99 mg/dL Final  . BUN 12/27/2014 19  6 - 23 mg/dL Final  . Creatinine, Ser 12/27/2014 1.14  0.50 - 1.35 mg/dL Final  . Calcium 12/27/2014 8.5  8.4 - 10.5 mg/dL Final  . GFR calc non Af Amer 12/27/2014 65* >90 mL/min Final  . GFR calc Af Amer 12/27/2014 76* >90 mL/min Final   Comment: (NOTE) The eGFR has been calculated using the CKD EPI equation. This calculation has not been validated in all clinical situations. eGFR's persistently <90 mL/min signify possible Chronic Kidney Disease.   . Anion gap 12/27/2014 8  5 - 15 Final  . WBC  12/28/2014 12.5* 4.0 - 10.5 K/uL Final  . RBC 12/28/2014 2.97* 4.22 - 5.81 MIL/uL Final  . Hemoglobin 12/28/2014 9.0* 13.0 - 17.0 g/dL Final   Comment: DELTA CHECK NOTED REPEATED TO VERIFY   . HCT 12/28/2014 27.4* 39.0 - 52.0 % Final  . MCV 12/28/2014 92.3  78.0 - 100.0 fL Final  . MCH 12/28/2014 30.3  26.0 - 34.0 pg Final  . MCHC 12/28/2014 32.8  30.0 - 36.0 g/dL Final  . RDW 12/28/2014 13.5  11.5 - 15.5 % Final  . Platelets 12/28/2014 170  150 - 400 K/uL Final  . Sodium 12/28/2014 132* 135 - 145 mmol/L Final  . Potassium 12/28/2014 3.7  3.5 - 5.1 mmol/L Final  . Chloride 12/28/2014 99  96 - 112 mmol/L Final    . CO2 12/28/2014 25  19 - 32 mmol/L Final  . Glucose, Bld 12/28/2014 134* 70 - 99 mg/dL Final  . BUN 12/28/2014 20  6 - 23 mg/dL Final  . Creatinine, Ser 12/28/2014 1.16  0.50 - 1.35 mg/dL Final  . Calcium 12/28/2014 8.1* 8.4 - 10.5 mg/dL Final  . GFR calc non Af Amer 12/28/2014 64* >90 mL/min Final  . GFR calc Af Amer 12/28/2014 74* >90 mL/min Final   Comment: (NOTE) The eGFR has been calculated using the CKD EPI equation. This calculation has not been validated in all clinical situations. eGFR's persistently <90 mL/min signify possible Chronic Kidney Disease.   Georgiann Hahn gap 12/28/2014 8  5 - 15 Final  Hospital Outpatient Visit on 12/19/2014  Component Date Value Ref Range Status  . aPTT 12/19/2014 36  24 - 37 seconds Final  . WBC 12/19/2014 6.4  4.0 - 10.5 K/uL Final  . RBC 12/19/2014 4.53  4.22 - 5.81 MIL/uL Final  . Hemoglobin 12/19/2014 13.8  13.0 - 17.0 g/dL Final  . HCT 12/19/2014 41.7  39.0 - 52.0 % Final  . MCV 12/19/2014 92.1  78.0 - 100.0 fL Final  . MCH 12/19/2014 30.5  26.0 - 34.0 pg Final  . MCHC 12/19/2014 33.1  30.0 - 36.0 g/dL Final  . RDW 12/19/2014 13.5  11.5 - 15.5 % Final  . Platelets 12/19/2014 193  150 - 400 K/uL Final  . Sodium 12/19/2014 141  135 - 145 mmol/L Final  . Potassium 12/19/2014 4.6  3.5 - 5.1 mmol/L Final  . Chloride 12/19/2014 104  96 - 112 mmol/L Final  . CO2 12/19/2014 27  19 - 32 mmol/L Final  . Glucose, Bld 12/19/2014 95  70 - 99 mg/dL Final  . BUN 12/19/2014 20  6 - 23 mg/dL Final  . Creatinine, Ser 12/19/2014 1.16  0.50 - 1.35 mg/dL Final  . Calcium 12/19/2014 9.2  8.4 - 10.5 mg/dL Final  . Total Protein 12/19/2014 7.3  6.0 - 8.3 g/dL Final  . Albumin 12/19/2014 4.2  3.5 - 5.2 g/dL Final  . AST 12/19/2014 21  0 - 37 U/L Final  . ALT 12/19/2014 19  0 - 53 U/L Final  . Alkaline Phosphatase 12/19/2014 26* 39 - 117 U/L Final  . Total Bilirubin 12/19/2014 0.6  0.3 - 1.2 mg/dL Final  . GFR calc non Af Amer 12/19/2014 64* >90 mL/min Final   . GFR calc Af Amer 12/19/2014 74* >90 mL/min Final   Comment: (NOTE) The eGFR has been calculated using the CKD EPI equation. This calculation has not been validated in all clinical situations. eGFR's persistently <90 mL/min signify possible Chronic Kidney Disease.   [name]  Anion gap 12/19/2014 10  5 - 15 Final  . Prothrombin Time 12/19/2014 14.4  11.6 - 15.2 seconds Final  . INR 12/19/2014 1.11  0.00 - 1.49 Final  . Color, Urine 12/19/2014 YELLOW  YELLOW Final  . APPearance 12/19/2014 CLEAR  CLEAR Final  . Specific Gravity, Urine 12/19/2014 1.018  1.005 - 1.030 Final  . pH 12/19/2014 6.0  5.0 - 8.0 Final  . Glucose, UA 12/19/2014 NEGATIVE  NEGATIVE mg/dL Final  . Hgb urine dipstick 12/19/2014 TRACE* NEGATIVE Final  . Bilirubin Urine 12/19/2014 NEGATIVE  NEGATIVE Final  . Ketones, ur 12/19/2014 NEGATIVE  NEGATIVE mg/dL Final  . Protein, ur 12/19/2014 NEGATIVE  NEGATIVE mg/dL Final  . Urobilinogen, UA 12/19/2014 0.2  0.0 - 1.0 mg/dL Final  . Nitrite 12/19/2014 NEGATIVE  NEGATIVE Final  . Leukocytes, UA 12/19/2014 NEGATIVE  NEGATIVE Final  . MRSA, PCR 12/19/2014 NEGATIVE  NEGATIVE Final  . Staphylococcus aureus 12/19/2014 POSITIVE* NEGATIVE Final   Comment:        The Xpert SA Assay (FDA approved for NASAL specimens in patients over 5 years of age), is one component of a comprehensive surveillance program.  Test performance has been validated by [facility] for patients greater than or equal to 64 year old. It is not intended to diagnose infection nor to guide or monitor treatment.   . Squamous Epithelial / LPF 12/19/2014 RARE  RARE Final  . RBC / HPF 12/19/2014 0-2  <3 RBC/hpf Final  . Bacteria, UA 12/19/2014 RARE  RARE Final  . Edwina Barth 12/19/2014 MUCOUS PRESENT   Final      Hospital Course: JAMAIL CULLERS is a 69 y.o. who was admitted to [facility]. They were brought to the operating room on 12/26/2014 and underwent Procedure(s): TOTAL RIGHT  KNEE  ARTHROPLASTY.  Patient tolerated the procedure well and was later transferred to the recovery room and then to the orthopaedic floor for postoperative care.  They were given PO and IV analgesics for pain control following their surgery.  They were given 24 hours of postoperative antibiotics of  Anti-infectives    Start     Dose/Rate Route Frequency Ordered Stop   12/26/14 2000  vancomycin (VANCOCIN) IVPB 1000 mg/200 mL premix     1,000 mg 200 mL/hr over 60 Minutes Intravenous Every 12 hours 12/26/14 1055 12/26/14 2116   12/26/14 0600  vancomycin (VANCOCIN) 1,500 mg in sodium chloride 0.9 % 500 mL IVPB     1,500 mg 250 mL/hr over 120 Minutes Intravenous On call to O.R. 12/25/14 1410 12/26/14 0940     and started on DVT prophylaxis in the form of Xarelto.   PT and OT were ordered for total joint protocol.  Discharge planning consulted to help with postop disposition and equipment needs.  Patient had a good night on the evening of surgery.  They started to get up OOB with therapy on day one. Hemovac drain was pulled without difficulty. Unfortunately he had significant drainage from the hemovac site. Dressing reinforced. Continued to work with therapy into day two.  Dressing was changed on day two and the incision was clean and dry. Patient had to have I&O cath post op day two, with eventual failure. Urology consulted to insert foley.  The patient had progressed with therapy and meeting their goals.  Incision was healing well.  Patient was seen in rounds and was ready to go home.   Diet: Cardiac diet Activity:WBAT Follow-up:in 2 weeks Disposition - Home Discharged Condition: stable  Discharge Instructions    Call MD / Call 911    Complete by:  As directed   If you experience chest pain or shortness of breath, CALL 911 and be transported to the hospital emergency room.  If you develope a fever above 101 F, pus (white drainage) or increased drainage or redness at the wound, or calf pain, call your  surgeon's office.     Change dressing    Complete by:  As directed   Change dressing daily with sterile 4 x 4 inch gauze dressing and apply TED hose.     Constipation Prevention    Complete by:  As directed   Drink plenty of fluids.  Prune juice may be helpful.  You may use a stool softener, such as Colace (over the counter) 100 mg twice a day.  Use MiraLax (over the counter) for constipation as needed.     Diet - low sodium heart healthy    Complete by:  As directed      Discharge instructions    Complete by:  As directed   INSTRUCTIONS AFTER JOINT REPLACEMENT   Remove items at home which could result in a fall. This includes throw rugs or furniture in walking pathways ICE to the affected joint every three hours while awake for 30 minutes at a time, for at least the first 3-5 days, and then as needed for pain and swelling.  Continue to use ice for pain and swelling. You may notice swelling that will progress down to the foot and ankle.  This is normal after surgery.  Elevate your leg when you are not up walking on it.   Continue to use the breathing machine you got in the hospital (incentive spirometer) which will help keep your temperature down.  It is common for your temperature to cycle up and down following surgery, especially at night when you are not up moving around and exerting yourself.  The breathing machine keeps your lungs expanded and your temperature down.   DIET:  As you were doing prior to hospitalization, we recommend a well-balanced diet.  DRESSING / WOUND CARE / SHOWERING  You may change your dressing after surgery.  Then change the dressing every day with sterile gauze.  Please use good hand washing techniques before changing the dressing.  Do not use any lotions or creams on the incision until instructed by your surgeon. and You may shower 3 days after surgery, but keep the wounds dry during showering.  You may use an occlusive plastic wrap (Press'n Seal for example), NO  SOAKING/SUBMERGING IN THE BATHTUB.  If the bandage gets wet, change with a clean dry gauze.  If the incision gets wet, pat the wound dry with a clean towel.  ACTIVITY  Increase activity slowly as tolerated, but follow the weight bearing instructions below.   No driving for 6 weeks or until further direction given by your physician.  You cannot drive while taking narcotics.  No lifting or carrying greater than 10 lbs. until further directed by your surgeon. Avoid periods of inactivity such as sitting longer than an hour when not asleep. This helps prevent blood clots.  You may return to work once you are authorized by your doctor.     WEIGHT BEARING   Weight bearing as tolerated with assist device (walker, cane, etc) as directed, use it as long as suggested by your surgeon or therapist, typically at least 4-6 weeks.   EXERCISES  Results after joint replacement surgery  are often greatly improved when you follow the exercise, range of motion and muscle strengthening exercises prescribed by your doctor. Safety measures are also important to protect the joint from further injury. Any time any of these exercises cause you to have increased pain or swelling, decrease what you are doing until you are comfortable again and then slowly increase them. If you have problems or questions, call your caregiver or physical therapist for advice.   Rehabilitation is important following a joint replacement. After just a few days of immobilization, the muscles of the leg can become weakened and shrink (atrophy).  These exercises are designed to build up the tone and strength of the thigh and leg muscles and to improve motion. Often times heat used for twenty to thirty minutes before working out will loosen up your tissues and help with improving the range of motion but do not use heat for the first two weeks following surgery (sometimes heat can increase post-operative swelling).   These exercises can be done on a  training (exercise) mat, on the floor, on a table or on a bed. Use whatever works the best and is most comfortable for you.    Use music or television while you are exercising so that the exercises are a pleasant break in your day. This will make your life better with the exercises acting as a break in your routine that you can look forward to.   Perform all exercises about fifteen times, three times per day or as directed.  You should exercise both the operative leg and the other leg as well.   Exercises include:   Quad Sets - Tighten up the muscle on the front of the thigh (Quad) and hold for 5-10 seconds.   Straight Leg Raises - With your knee straight (if you were given a brace, keep it on), lift the leg to 60 degrees, hold for 3 seconds, and slowly lower the leg.  Perform this exercise against resistance later as your leg gets stronger.  Leg Slides: Lying on your back, slowly slide your foot toward your buttocks, bending your knee up off the floor (only go as far as is comfortable). Then slowly slide your foot back down until your leg is flat on the floor again.  Angel Wings: Lying on your back spread your legs to the side as far apart as you can without causing discomfort.  Hamstring Strength:  Lying on your back, push your heel against the floor with your leg straight by tightening up the muscles of your buttocks.  Repeat, but this time bend your knee to a comfortable angle, and push your heel against the floor.  You may put a pillow under the heel to make it more comfortable if necessary.   A rehabilitation program following joint replacement surgery can speed recovery and prevent re-injury in the future due to weakened muscles. Contact your doctor or a physical therapist for more information on knee rehabilitation.    CONSTIPATION  Constipation is defined medically as fewer than three stools per week and severe constipation as less than one stool per week.  Even if you have a regular bowel  pattern at home, your normal regimen is likely to be disrupted due to multiple reasons following surgery.  Combination of anesthesia, postoperative narcotics, change in appetite and fluid intake all can affect your bowels.   YOU MUST use at least one of the following options; they are listed in order of increasing strength to get the job  done.  They are all available over the counter, and you may need to use some, POSSIBLY even all of these options:    Drink plenty of fluids (prune juice may be helpful) and high fiber foods Colace 100 mg by mouth twice a day  Senokot for constipation as directed and as needed Dulcolax (bisacodyl), take with full glass of water  Miralax (polyethylene glycol) once or twice a day as needed.  If you have tried all these things and are unable to have a bowel movement in the first 3-4 days after surgery call either your surgeon or your primary doctor.    If you experience loose stools or diarrhea, hold the medications until you stool forms back up.  If your symptoms do not get better within 1 week or if they get worse, check with your doctor.  If you experience "the worst abdominal pain ever" or develop nausea or vomiting, please contact the office immediately for further recommendations for treatment.   ITCHING:  If you experience itching with your medications, try taking only a single pain pill, or even half a pain pill at a time.  You can also use Benadryl over the counter for itching or also to help with sleep.   TED HOSE STOCKINGS:  Use stockings on both legs until for at least 2 weeks or as directed by physician office. They may be removed at night for sleeping.  MEDICATIONS:  See your medication summary on the "After Visit Summary" that nursing will review with you.  You may have some home medications which will be placed on hold until you complete the course of blood thinner medication.  It is important for you to complete the blood thinner medication as  prescribed.  PRECAUTIONS:  If you experience chest pain or shortness of breath - call 911 immediately for transfer to the hospital emergency department.   If you develop a fever greater that 101 F, purulent drainage from wound, increased redness or drainage from wound, foul odor from the wound/dressing, or calf pain - CONTACT YOUR SURGEON.                                                   FOLLOW-UP APPOINTMENTS:  If you do not already have a post-op appointment, please call the office for an appointment to be seen by your surgeon.  Guidelines for how soon to be seen are listed in your "After Visit Summary", but are typically between 1-4 weeks after surgery.  OTHER INSTRUCTIONS:   Knee Replacement:  Do not place pillow under knee, focus on keeping the knee straight while resting. CPM instructions: 0-90 degrees, 2 hours in the morning, 2 hours in the afternoon, and 2 hours in the evening. Place foam block, curve side up under heel at all times except when in CPM or when walking.  DO NOT modify, tear, cut, or change the foam block in any way.  MAKE SURE YOU:  Understand these instructions.  Get help right away if you are not doing well or get worse.    Thank you for letting us be a part of your medical care team.  It is a privilege we respect greatly.  We hope these instructions will help you stay on track for a fast and full recovery!     Do not put a pillow under the  knee. Place it under the heel.    Complete by:  As directed      Increase activity slowly as tolerated    Complete by:  As directed      TED hose    Complete by:  As directed   Use stockings (TED hose) for 3 weeks on both leg(s).  You may remove them at night for sleeping.            Medication List    STOP taking these medications        aspirin EC 325 MG tablet     cyclobenzaprine 10 MG tablet  Commonly known as:  FLEXERIL     multivitamin tablet      TAKE these medications        ALPRAZolam 0.5 MG tablet    Commonly known as:  XANAX  Take 0.5 mg by mouth 2 (two) times daily as needed for anxiety.     docusate sodium 100 MG capsule  Commonly known as:  COLACE  Take 1 capsule (100 mg total) by mouth 2 (two) times daily.     ferrous sulfate 325 (65 FE) MG tablet  Take 1 tablet (325 mg total) by mouth 3 (three) times daily.     HYDROmorphone 4 MG tablet  Commonly known as:  DILAUDID  Take 1-2 tablets (4-8 mg total) by mouth every 3 (three) hours as needed for severe pain.     methocarbamol 500 MG tablet  Commonly known as:  ROBAXIN  Take 1 tablet (500 mg total) by mouth every 6 (six) hours as needed for muscle spasms.     morphine 30 MG 12 hr tablet  Commonly known as:  MS CONTIN  Take 30 mg by mouth every 12 (twelve) hours.     morphine 15 MG tablet  Commonly known as:  MSIR  Take 15 mg by mouth every 4 (four) hours as needed for moderate pain or severe pain.     PARoxetine 10 MG tablet  Commonly known as:  PAXIL  Take 40 mg by mouth every morning.     rivaroxaban 10 MG Tabs tablet  Commonly known as:  XARELTO  Take 1 tablet (10 mg total) by mouth daily with breakfast.     simvastatin 20 MG tablet  Commonly known as:  ZOCOR  Take 1 tablet (20 mg total) by mouth daily at 6 PM.     sodium chloride 0.65 % Soln nasal spray  Commonly known as:  OCEAN  Place 1 spray into both nostrils as needed for congestion.     tamsulosin 0.4 MG Caps capsule  Commonly known as:  FLOMAX  Take 0.4 mg by mouth daily.           Follow-up Information    Follow up with Gearlean Alf, MD. Schedule an appointment as soon as possible for a visit on 01/10/2015.   Specialty:  Orthopedic Surgery   Why:  Call [phone] tomorrow to make the appointment   Contact information:   [address] [place] [address] [phone]       Follow up with [facility].   Why:  home health physical therapy   Contact information:   [place]  [id number] [phone]       Follow up with [facility].   Why:  3n1 and rolling walker   Contact information:   [address] [place] [address] [phone]  Signed: Ardeen Jourdain, PA-C Orthopaedic Surgery 12/30/2014, 11:42 AM

## 2015-01-08 ENCOUNTER — Emergency Department (HOSPITAL_COMMUNITY)
Admission: EM | Admit: 2015-01-08 | Discharge: 2015-01-08 | Disposition: A | Discharge status: Home / Self Care | Payer: BLUE CROSS/BLUE SHIELD | Attending: Emergency Medicine | Admitting: Emergency Medicine

## 2015-01-08 ENCOUNTER — Encounter (HOSPITAL_COMMUNITY): Payer: Self-pay | Admitting: Emergency Medicine

## 2015-01-08 ENCOUNTER — Emergency Department (HOSPITAL_COMMUNITY): Payer: BLUE CROSS/BLUE SHIELD

## 2015-01-08 DIAGNOSIS — Z79899 Other long term (current) drug therapy: Secondary | ICD-10-CM | POA: Diagnosis not present

## 2015-01-08 DIAGNOSIS — D649 Anemia, unspecified: Secondary | ICD-10-CM | POA: Insufficient documentation

## 2015-01-08 DIAGNOSIS — Z8739 Personal history of other diseases of the musculoskeletal system and connective tissue: Secondary | ICD-10-CM | POA: Diagnosis not present

## 2015-01-08 DIAGNOSIS — Z8719 Personal history of other diseases of the digestive system: Secondary | ICD-10-CM | POA: Diagnosis not present

## 2015-01-08 DIAGNOSIS — R000 Tachycardia, unspecified: Secondary | ICD-10-CM | POA: Insufficient documentation

## 2015-01-08 DIAGNOSIS — Z87891 Personal history of nicotine dependence: Secondary | ICD-10-CM | POA: Diagnosis not present

## 2015-01-08 DIAGNOSIS — Z8673 Personal history of transient ischemic attack (TIA), and cerebral infarction without residual deficits: Secondary | ICD-10-CM | POA: Insufficient documentation

## 2015-01-08 DIAGNOSIS — Z880 Allergy status to penicillin: Secondary | ICD-10-CM | POA: Insufficient documentation

## 2015-01-08 DIAGNOSIS — F329 Major depressive disorder, single episode, unspecified: Secondary | ICD-10-CM | POA: Insufficient documentation

## 2015-01-08 DIAGNOSIS — R0602 Shortness of breath: Secondary | ICD-10-CM

## 2015-01-08 DIAGNOSIS — G8929 Other chronic pain: Secondary | ICD-10-CM | POA: Diagnosis not present

## 2015-01-08 LAB — I-STAT CHEM 8, ED
BUN: 19 mg/dL (ref 6–23)
Calcium, Ion: 1.19 mmol/L (ref 1.13–1.30)
Chloride: 101 mmol/L (ref 96–112)
Creatinine, Ser: 1.1 mg/dL (ref 0.50–1.35)
Glucose, Bld: 124 mg/dL — ABNORMAL HIGH (ref 70–99)
HCT: 32 % — ABNORMAL LOW (ref 39.0–52.0)
Hemoglobin: 10.9 g/dL — ABNORMAL LOW (ref 13.0–17.0)
Potassium: 3.5 mmol/L (ref 3.5–5.1)
Sodium: 138 mmol/L (ref 135–145)
TCO2: 21 mmol/L (ref 0–100)

## 2015-01-08 LAB — BASIC METABOLIC PANEL
Anion gap: 9 (ref 5–15)
BUN: 18 mg/dL (ref 6–23)
CO2: 25 mmol/L (ref 19–32)
Calcium: 9.7 mg/dL (ref 8.4–10.5)
Chloride: 100 mmol/L (ref 96–112)
Creatinine, Ser: 1.17 mg/dL (ref 0.50–1.35)
GFR calc Af Amer: 73 mL/min — ABNORMAL LOW (ref >90)
GFR calc non Af Amer: 63 mL/min — ABNORMAL LOW (ref >90)
Glucose, Bld: 102 mg/dL — ABNORMAL HIGH (ref 70–99)
Potassium: 3.8 mmol/L (ref 3.5–5.1)
Sodium: 134 mmol/L — ABNORMAL LOW (ref 135–145)

## 2015-01-08 LAB — CBC WITH DIFFERENTIAL/PLATELET
Basophils Absolute: 0 10*3/uL (ref 0.0–0.1)
Basophils Relative: 0 % (ref 0–1)
Eosinophils Absolute: 0.1 10*3/uL (ref 0.0–0.7)
Eosinophils Relative: 2 % (ref 0–5)
HCT: 32.9 % — ABNORMAL LOW (ref 39.0–52.0)
Hemoglobin: 10.3 g/dL — ABNORMAL LOW (ref 13.0–17.0)
Lymphocytes Relative: 18 % (ref 12–46)
Lymphs Abs: 1.6 10*3/uL (ref 0.7–4.0)
MCH: 29.1 pg (ref 26.0–34.0)
MCHC: 31.3 g/dL (ref 30.0–36.0)
MCV: 92.9 fL (ref 78.0–100.0)
Monocytes Absolute: 0.8 10*3/uL (ref 0.1–1.0)
Monocytes Relative: 9 % (ref 3–12)
Neutro Abs: 6.8 10*3/uL (ref 1.7–7.7)
Neutrophils Relative %: 71 % (ref 43–77)
Platelets: 503 10*3/uL — ABNORMAL HIGH (ref 150–400)
RBC: 3.54 MIL/uL — ABNORMAL LOW (ref 4.22–5.81)
RDW: 13.7 % (ref 11.5–15.5)
WBC: 9.4 10*3/uL (ref 4.0–10.5)

## 2015-01-08 LAB — I-STAT TROPONIN, ED: Troponin i, poc: 0 ng/mL (ref 0.00–0.08)

## 2015-01-08 LAB — I-STAT CREATININE, ED: Creatinine, Ser: 1.3 mg/dL (ref 0.50–1.35)

## 2015-01-08 LAB — BRAIN NATRIURETIC PEPTIDE: B Natriuretic Peptide: 7 pg/mL (ref 0.0–100.0)

## 2015-01-08 MED ORDER — IOHEXOL 350 MG/ML SOLN
80.0000 mL | Freq: Once | INTRAVENOUS | Status: AC | PRN
  Administered 2015-01-08: 80 mL via INTRAVENOUS

## 2015-01-08 NOTE — ED Notes (Signed)
Pt sent over by pcp to r/o PE. Pt had knee replacement 2 weeks ago and has had increasingly sob since then. Denies cp.

## 2015-01-08 NOTE — ED Provider Notes (Signed)
CSN: [id number]     Arrival date & time 01/08/15  1557 History   First MD Initiated Contact with Patient 01/08/15 1608     Chief Complaint  Patient presents with  . Shortness of Breath     (Consider location/radiation/quality/duration/timing/severity/associated sxs/prior Treatment) HPI Comments: Joseph Foster is a 69 y.o. male with a PMHx of lumbar DDD, chronic back pain, GERD, ?TIA, and recent R total knee arthroplasty on 12/26/14, who presents to the ED with complaints of gradual onset shortness of breath on exertion since his knee replacement 2 weeks ago. He has been on Xarelto10 mg daily and wearing his compression stockings as he was instructed to do. He states that the shortness breath only occurs with exertion, and he has none at rest. Aggravating factors include exertion, and symptom is fully alleviated by rest. He endorses that he is a former smoker and has "a small amount of COPD", but that he has had no issues prior to surgery with any shortness of breath. He denies any fevers, chills, chest pain, cough, hemoptysis, wheezing, leg swelling, recent travel, abdominal pain, nausea, vomiting, diarrhea, constipation, melena, hematochezia, dysuria, hematuria, numbness, tingling, weakness, claudication, PND, orthopnea, or recent weight gain. He denies any family or personal history of PE/DVT. Denies any family or personal history of cardiac disease.  Patient is a 69 y.o. male presenting with shortness of breath. The history is provided by the patient. No language interpreter was used.  Shortness of Breath Severity:  Mild Onset quality:  Gradual Duration:  2 weeks Timing:  Constant Progression:  Unchanged Chronicity:  New Context: activity   Relieved by:  Rest Worsened by:  Exertion Ineffective treatments:  None tried Associated symptoms: no abdominal pain, no chest pain, no claudication, no cough, no fever, no hemoptysis, no PND, no sputum production, no vomiting and no wheezing   Risk  factors: obesity and recent surgery   Risk factors: no hx of PE/DVT and no tobacco use     Past Medical History  Diagnosis Date  . DDD (degenerative disc disease), lumbar L4  . Chronic back pain   . Arthritis KNEES  . Acute medial meniscus tear of left knee   . Depression   . Stroke     ? TIA   . GERD (gastroesophageal reflux disease)    Past Surgical History  Procedure Laterality Date  . Spinal cord stimulator implant  2009  . Right knee surg.  2005  . Knee arthroscopy  01/15/2012    Procedure: ARTHROSCOPY KNEE;  Surgeon: Gearlean Alf, MD;  Location: [facility];  Service: Orthopedics;  Laterality: Left;  meniscal debridement  . Tonsilectomy/adenoidectomy with myringotomy    . Vasectomy    . Hc eeg adult  08/23/2014       . Total knee arthroplasty Right 12/26/2014    Procedure: TOTAL RIGHT  KNEE ARTHROPLASTY;  Surgeon: Gaynelle Arabian, MD;  Location: [facility];  Service: Orthopedics;  Laterality: Right;   Family History  Problem Relation Age of Onset  . Heart disease Father   . Leukemia Mother   . Hyperlipidemia Mother    History  Substance Use Topics  . Smoking status: Former Smoker    Types: Cigars    Quit date: 01/09/2001  . Smokeless tobacco: Current User    Types: Snuff  . Alcohol Use: 12.6 oz/week    7 Cans of beer, 14 Shots of liquor per week    Review of Systems  Constitutional: Negative for fever and  chills.  Respiratory: Positive for shortness of breath. Negative for cough, hemoptysis, sputum production, chest tightness and wheezing.   Cardiovascular: Negative for chest pain, claudication, leg swelling and PND.  Gastrointestinal: Negative for nausea, vomiting, abdominal pain, diarrhea, constipation and blood in stool.  Genitourinary: Negative for dysuria and hematuria.  Musculoskeletal: Negative for myalgias and arthralgias.  Skin: Negative for color change.  Allergic/Immunologic: Negative for immunocompromised state.  Neurological: Negative  for weakness and numbness.  Psychiatric/Behavioral: Negative for confusion.   10 Systems reviewed and are negative for acute change except as noted in the HPI.    Allergies  Ceclor; Codeine; Penicillins; and Oxycontin  Home Medications   Prior to Admission medications   Medication Sig Start Date End Date Taking? Authorizing Provider  ALPRAZolam Duanne Moron) 0.5 MG tablet Take 0.5 mg by mouth 2 (two) times daily as needed for anxiety.    Historical Provider, MD  docusate sodium (COLACE) 100 MG capsule Take 1 capsule (100 mg total) by mouth 2 (two) times daily. 12/27/14   Amber Cecilio Asper, PA-C  ferrous sulfate 325 (65 FE) MG tablet Take 1 tablet (325 mg total) by mouth 3 (three) times daily. 12/28/14   Amber Constable, PA-C  HYDROmorphone (DILAUDID) 4 MG tablet Take 1-2 tablets (4-8 mg total) by mouth every 3 (three) hours as needed for severe pain. 12/27/14   Amber Cecilio Asper, PA-C  methocarbamol (ROBAXIN) 500 MG tablet Take 1 tablet (500 mg total) by mouth every 6 (six) hours as needed for muscle spasms. 12/27/14   Amber Constable, PA-C  morphine (MS CONTIN) 30 MG 12 hr tablet Take 30 mg by mouth every 12 (twelve) hours.    Historical Provider, MD  morphine (MSIR) 15 MG tablet Take 15 mg by mouth every 4 (four) hours as needed for moderate pain or severe pain.    Historical Provider, MD  PARoxetine (PAXIL) 10 MG tablet Take 40 mg by mouth every morning.     Historical Provider, MD  rivaroxaban (XARELTO) 10 MG TABS tablet Take 1 tablet (10 mg total) by mouth daily with breakfast. 12/27/14   Ardeen Jourdain, PA-C  simvastatin (ZOCOR) 20 MG tablet Take 1 tablet (20 mg total) by mouth daily at 6 PM. 08/23/14   Shanker Kristeen Mans, MD  sodium chloride (OCEAN) 0.65 % SOLN nasal spray Place 1 spray into both nostrils as needed for congestion.    Historical Provider, MD  tamsulosin (FLOMAX) 0.4 MG CAPS capsule Take 0.4 mg by mouth daily.    Historical Provider, MD   BP 133/91 mmHg  Pulse 101  Temp(Src) 98.3  F (36.8 C) (Oral)  Resp 20  SpO2 100% Physical Exam  Constitutional: He is oriented to person, place, and time. He appears well-developed and well-nourished.  Non-toxic appearance. No distress.  Afebrile, nontoxic, NAD, pleasant male, with mild tachycardia at 101, but otherwise VSS WNL  HENT:  Head: Normocephalic and atraumatic.  Mouth/Throat: Oropharynx is clear and moist and mucous membranes are normal.  Eyes: Conjunctivae and EOM are normal. Right eye exhibits no discharge. Left eye exhibits no discharge.  Neck: Normal range of motion. Neck supple.  Cardiovascular: Regular rhythm, normal heart sounds and intact distal pulses.  Tachycardia present.  Exam reveals no gallop and no friction rub.   No murmur heard. Tachycardic at 101, reg rhythm, nl s1/s2, no m/r/g, distal pulses intact, no pedal edema   Pulmonary/Chest: Effort normal and breath sounds normal. No respiratory distress. He has no decreased breath sounds. He has no wheezes. He has  no rhonchi. He has no rales.  CTAB in all lung fields, no w/r/r, no hypoxia or increased WOB, speaking in full sentences, SpO2 100% on RA  Abdominal: Soft. Normal appearance and bowel sounds are normal. He exhibits no distension. There is no tenderness. There is no rigidity, no rebound, no guarding, no tenderness at McBurney's point and negative Murphy's sign.  Musculoskeletal: Normal range of motion.  R knee with midline surgical incision, well healing, without surrounding erythema or warmth, no drainage. BLEs without erythema or swelling. MAE x4 Strength and sensation grossly intact Distal pulses intact No pedal edema, neg homan's sign bilaterally   Neurological: He is alert and oriented to person, place, and time. He has normal strength. No sensory deficit.  Skin: Skin is warm, dry and intact. No rash noted. No erythema.  Psychiatric: He has a normal mood and affect.  Nursing note and vitals reviewed.   ED Course  Procedures (including  critical care time) Labs Review Labs Reviewed  CBC WITH DIFFERENTIAL/PLATELET - Abnormal; Notable for the following:    RBC 3.54 (*)    Hemoglobin 10.3 (*)    HCT 32.9 (*)    Platelets 503 (*)    All other components within normal limits  BASIC METABOLIC PANEL - Abnormal; Notable for the following:    Sodium 134 (*)    Glucose, Bld 102 (*)    GFR calc non Af Amer 63 (*)    GFR calc Af Amer 73 (*)    All other components within normal limits  I-STAT CHEM 8, ED - Abnormal; Notable for the following:    Glucose, Bld 124 (*)    Hemoglobin 10.9 (*)    HCT 32.0 (*)    All other components within normal limits  BRAIN NATRIURETIC PEPTIDE  I-STAT TROPOININ, ED  I-STAT CREATININE, ED  I-STAT CREATININE, ED    Imaging Review Ct Angio Chest Pe W/cm &/or Wo Cm  01/08/2015   CLINICAL DATA:  Knee replacement 2 weeks ago, increasing shortness of breath since surgery. No chest pain.  EXAM: CT ANGIOGRAPHY CHEST WITH CONTRAST  TECHNIQUE: Multidetector CT imaging of the chest was performed using the standard protocol during bolus administration of intravenous contrast. Multiplanar CT image reconstructions and MIPs were obtained to evaluate the vascular anatomy.  CONTRAST:  20mL OMNIPAQUE IOHEXOL 350 MG/ML SOLN  COMPARISON:  Chest radiograph December 09, 2013  FINDINGS: PULMONARY ARTERY: Adequate contrast opacification of the pulmonary artery's. Main pulmonary artery is not enlarged. No pulmonary arterial filling defects to the level of the subsegmental branches.  MEDIASTINUM: Heart size is normal, mild Coronary artery calcifications. No RIGHT heart strain. No pericardial fluid collections. Thoracic aorta is normal course and caliber, mild calcific atherosclerosis. No lymphadenopathy by CT size criteria.  LUNGS: Tracheobronchial tree is patent, no pneumothorax. No pleural effusion or focal consolidation. Biapical bullous changes and scarring.  SOFT TISSUES AND OSSEOUS STRUCTURES: Included view of the abdomen is  unremarkable. Visualized soft tissues and included osseous structures appear non acute ; spinal stimulator leads in mid thoracic spinal canal. The mild to moderate degenerative change of the thoracic spine.  Review of the MIP images confirms the above findings.  IMPRESSION: No acute pulmonary embolism nor acute cardiopulmonary process.   Electronically Signed   By: Elon Alas   On: 01/08/2015 22:54     EKG Interpretation   Date/Time:  Sunday January 08 2015 16:03:11 EDT Ventricular Rate:  115 PR Interval:  158 QRS Duration: 84 QT Interval:  318  QTC Calculation: 439 R Axis:   -44 Text Interpretation:  Sinus tachycardia Right atrial enlargement Left axis  deviation Pulmonary disease pattern Abnormal ECG heart rate increased when  compared to prior Confirmed by ALLEN  MD, ANTHONY ([id number]) on 01/08/2015  4:48:44 PM      MDM   Final diagnoses:  Shortness of breath  SOB (shortness of breath) on exertion  Anemia, unspecified anemia type    69 y.o. male here with SOB after knee replacement surgery. On xarelto 10mg  daily, and wearing compression stockings. No LE swelling or discoloration. Only occurs with exertion, not at rest. No CP. Was sent here by PCP for r/o of PE. Although I believe it's unlikely, he does have a high PERC score and well's score based on recent surgery and tachycardia (no hypoxia). Will proceed with CTA. Will also get basic labs and EKG. Lung sounds clear, no wheezing, doubt need for any treatment at this time. DDx includes anemia, deconditioning, PE. Will reassess shortly.   11:03 PM Unfortunately there were multiple delays in labs and CT resulting, causing long ED stay for pt. BMP with mildly low Na at 134, chronic and unchanged. CBC with diff showing anemia which has improved since his hospital stay, hgb now 10.3. Pt taking iron still. Trop neg. EKG with some tachycardia which has since resolved over the course of his ED stay. BNP WNL. CT angio negative for PE. Will  have him continue his iron supplements, with OJ at breakfast, and f/up with his PCP in 1wk. I explained the diagnosis and have given explicit precautions to return to the ER including for any other new or worsening symptoms. The patient understands and accepts the medical plan as it's been dictated and I have answered their questions. Discharge instructions concerning home care and prescriptions have been given. The patient is STABLE and is discharged to home in good condition.  BP 134/74 mmHg  Pulse 87  Temp(Src) 98.3 F (36.8 C) (Oral)  Resp 18  SpO2 99%    Valina Maes Camprubi-Soms, PA-C 01/08/15 2305

## 2015-01-08 NOTE — Discharge Instructions (Signed)
Continue taking your iron every morning with breakfast with a glass of orange juice. Stay well hydrated. Continue your usual home medications, and use of compression stockings. See your regular doctor in 1 week for recheck of your symptoms. Return to the ER for changes or worsening symptoms.   Shortness of Breath Shortness of breath means you have trouble breathing. Shortness of breath needs medical care right away. HOME CARE   Do not smoke.  Avoid being around chemicals or things (paint fumes, dust) that may bother your breathing.  Rest as needed. Slowly begin your normal activities.  Only take medicines as told by your doctor.  Keep all doctor visits as told. GET HELP RIGHT AWAY IF:   Your shortness of breath gets worse.  You feel lightheaded, pass out (faint), or have a cough that is not helped by medicine.  You cough up blood.  You have pain with breathing.  You have pain in your chest, arms, shoulders, or belly (abdomen).  You have a fever.  You cannot walk up stairs or exercise the way you normally do.  You do not get better in the time expected.  You have a hard time doing normal activities even with rest.  You have problems with your medicines.  You have any new symptoms. MAKE SURE YOU:  Understand these instructions.  Will watch your condition.  Will get help right away if you are not doing well or get worse. Document Released: 03/04/2008 Document Revised: 09/21/2013 Document Reviewed: 12/02/2011 [facility] Patient Information 2015 [place], [place]. This information is not intended to replace advice given to you by your health care provider. Make sure you discuss any questions you have with your health care provider.  Iron Deficiency Anemia Anemia is a condition in which there are less red blood cells or hemoglobin in the blood than normal. Hemoglobin is the part of red blood cells that carries oxygen. Iron deficiency anemia is anemia caused by too little iron.  It is the most common type of anemia. It may leave you tired and short of breath. CAUSES   Lack of iron in the diet.  Poor absorption of iron, as seen with intestinal disorders.  Intestinal bleeding.  Heavy periods. SIGNS AND SYMPTOMS  Mild anemia may not be noticeable. Symptoms may include:  Fatigue.  Headache.  Pale skin.  Weakness.  Tiredness.  Shortness of breath.  Dizziness.  Cold hands and feet.  Fast or irregular heartbeat. DIAGNOSIS  Diagnosis requires a thorough evaluation and physical exam by your health care provider. Blood tests are generally used to confirm iron deficiency anemia. Additional tests may be done to find the underlying cause of your anemia. These may include:  Testing for blood in the stool (fecal occult blood test).  A procedure to see inside the colon and rectum (colonoscopy).  A procedure to see inside the esophagus and stomach (endoscopy). TREATMENT  Iron deficiency anemia is treated by correcting the cause of the deficiency. Treatment may involve:  Adding iron-rich foods to your diet.  Taking iron supplements. Pregnant or breastfeeding women need to take extra iron because their normal diet usually does not provide the required amount.  Taking vitamins. Vitamin C improves the absorption of iron. Your health care provider may recommend that you take your iron tablets with a glass of orange juice or vitamin C supplement.  Medicines to make heavy menstrual flow lighter.  Surgery. HOME CARE INSTRUCTIONS   Take iron as directed by your health care provider.  If  you cannot tolerate taking iron supplements by mouth, talk to your health care provider about taking them through a vein (intravenously) or an injection into a muscle.  For the best iron absorption, iron supplements should be taken on an empty stomach. If you cannot tolerate them on an empty stomach, you may need to take them with food.  Do not drink milk or take antacids at  the same time as your iron supplements. Milk and antacids may interfere with the absorption of iron.  Iron supplements can cause constipation. Make sure to include fiber in your diet to prevent constipation. A stool softener may also be recommended.  Take vitamins as directed by your health care provider.  Eat a diet rich in iron. Foods high in iron include liver, lean beef, whole-grain bread, eggs, dried fruit, and dark green leafy vegetables. SEEK IMMEDIATE MEDICAL CARE IF:   You faint. If this happens, do not drive. Call your local emergency services (911 in [place] if no other help is available.  You have chest pain.  You feel nauseous or vomit.  You have severe or increased shortness of breath with activity.  You feel weak.  You have a rapid heartbeat.  You have unexplained sweating.  You become light-headed when getting up from a chair or bed. MAKE SURE YOU:   Understand these instructions.  Will watch your condition.  Will get help right away if you are not doing well or get worse. Document Released: 09/13/2000 Document Revised: 09/21/2013 Document Reviewed: 05/24/2013 [facility] Patient Information 2015 [place], [place]. This information is not intended to replace advice given to you by your health care provider. Make sure you discuss any questions you have with your health care provider.

## 2015-01-08 NOTE — ED Provider Notes (Signed)
Medical screening examination/treatment/procedure(s) were performed by non-physician practitioner and as supervising physician I was immediately available for consultation/collaboration.   EKG Interpretation   Date/Time:  Sunday January 08 2015 16:03:11 EDT Ventricular Rate:  115 PR Interval:  158 QRS Duration: 84 QT Interval:  318 QTC Calculation: 439 R Axis:   -44 Text Interpretation:  Sinus tachycardia Right atrial enlargement Left axis  deviation Pulmonary disease pattern Abnormal ECG heart rate increased when  compared to prior Confirmed by Iyesha Such  MD, Affan Callow ([id number]) on 01/08/2015  4:48:44 PM     Patient here with 2 weeks of shortness of breath that started after he had surgery. Was sent here for evaluation of possible of pulmonary embolism and he denies any pleuritic chest pain. Will obtain chest CT.  Lacretia Leigh, MD 01/08/15 2158

## 2015-01-08 NOTE — ED Notes (Signed)
Called and asked about status of CT scan. Stated tech would check and ensure it crossed over.

## 2015-01-16 ENCOUNTER — Ambulatory Visit: Payer: BLUE CROSS/BLUE SHIELD | Attending: Orthopedic Surgery | Admitting: Physical Therapy

## 2015-01-16 ENCOUNTER — Encounter: Payer: Self-pay | Admitting: Physical Therapy

## 2015-01-16 DIAGNOSIS — Z96651 Presence of right artificial knee joint: Secondary | ICD-10-CM | POA: Diagnosis not present

## 2015-01-16 DIAGNOSIS — M25561 Pain in right knee: Secondary | ICD-10-CM

## 2015-01-16 DIAGNOSIS — M7989 Other specified soft tissue disorders: Secondary | ICD-10-CM

## 2015-01-16 DIAGNOSIS — R262 Difficulty in walking, not elsewhere classified: Secondary | ICD-10-CM | POA: Diagnosis not present

## 2015-01-16 NOTE — Therapy (Signed)
[address] [place], [place], [address] Phone: [phone]   Fax:  [phone]  Physical Therapy Evaluation  Patient Details  Name: Joseph Foster MRN: [id number] Date of Birth: [birth year] Referring Provider:  Gaynelle Arabian, MD  Encounter Date: 01/16/2015      PT End of Session - 01/16/15 0841    Visit Number 1   Date for PT Re-Evaluation 03/18/15   PT Start Time 0800   PT Stop Time 3154   PT Time Calculation (min) 55 min      Past Medical History  Diagnosis Date  . DDD (degenerative disc disease), lumbar L4  . Chronic back pain   . Arthritis KNEES  . Acute medial meniscus tear of left knee   . Depression   . Stroke     ? TIA   . GERD (gastroesophageal reflux disease)     Past Surgical History  Procedure Laterality Date  . Spinal cord stimulator implant  2009  . Right knee surg.  2005  . Knee arthroscopy  01/15/2012    Procedure: ARTHROSCOPY KNEE;  Surgeon: Gearlean Alf, MD;  Location: [facility];  Service: Orthopedics;  Laterality: Left;  meniscal debridement  . Tonsilectomy/adenoidectomy with myringotomy    . Vasectomy    . Hc eeg adult  08/23/2014       . 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  Visit Diagnosis:  Right knee pain - Plan: PT plan of care cert/re-cert  Difficulty walking - Plan: PT plan of care cert/re-cert  Swelling of limb - Plan: PT plan of care cert/re-cert      Subjective Assessment - 01/16/15 0808    Subjective S/P right TKR on 12/26/14.  Had home PT for 2 weeks.     Limitations Standing;Walking;House hold activities   How long can you sit comfortably? 1 hour   How long can you stand comfortably? 10   How long can you walk comfortably? 10   Patient Stated  Goals no pain, return to work   Currently in Pain? Yes   Pain Score 4    Pain Location Knee   Pain Orientation Right   Pain Descriptors / Indicators Tightness;Aching   Pain Type Acute pain   Pain Onset 1 to 4 weeks ago   Pain Frequency Intermittent   Aggravating Factors  sitting or walking   Pain Relieving Factors ice   Effect of Pain on Daily Activities limits ability to be active            [facility] PT Assessment - 01/16/15 0001    Assessment   Medical Diagnosis S/P right TKR   Onset Date 12/26/14   Prior Therapy home PT   Precautions   Precautions None   Precaution Comments has a spinal cord stimulator   Restrictions   Weight Bearing Restrictions No   Balance Screen   Has the patient fallen in the past 6 months No   Has the patient had a decrease in activity level because of a fear of falling?  No   Is the patient reluctant to leave their home because of a fear of falling?  No   Home Environment   Additional Comments has stairs   Prior Function   Level of  Independence Independent with basic ADLs;Independent with homemaking with ambulation   Vocation Full time employment   Vocation Requirements stand and walk, armed security   Leisure walk dog   ROM / Strength   AROM / PROM / Strength AROM   AROM   Overall AROM Comments AROM 22-88 degrees flexion,   PROM 5-94 degrees flexion   Strength   Overall Strength Comments 4/5   Flexibility   Soft Tissue Assessment /Muscle Length --  very tight quad, scar is mobile   Palpation   Palpation warm, ballotable patella   Special Tests    Special Tests --  quad lag   Ambulation/Gait   Gait Comments no assistive deivce, mild antalgic on the right, stiff right knee                   [facility] Adult PT Treatment/Exercise - 01/16/15 0001    Knee/Hip Exercises: Aerobic   Stationary Bike 5 minutes partial revs   Isokinetic Nustep L4 x 6 minutes   Knee/Hip Exercises: Machines for Strengthening   Cybex Knee Extension 5#  2x10   Cybex Knee Flexion 25# 2x10                PT Education - 01/16/15 0840    Education provided Yes   Education Details Low load long duration stretches   Person(s) Educated Patient   Methods Explanation;Demonstration;Handout   Comprehension Verbalized understanding;Returned demonstration          PT Short Term Goals - 01/16/15 0842    PT SHORT TERM GOAL #1   Title independent with initial HEP   Time 2   Period Weeks   Status New           PT Long Term Goals - 01/16/15 9562    PT LONG TERM GOAL #1   Title walk 1000 feet without assistive device   Time 8   Period Weeks   Status New   PT LONG TERM GOAL #2   Title increase AROM of the right knee to 8-110 degrees flexion   Time 8   Period Weeks   Status New   PT LONG TERM GOAL #3   Title decrease pain 50%   Time 8   Period Weeks   Status New   PT LONG TERM GOAL #4   Title increase strength to WNL's   Time 8   Period Weeks   Status New               Plan - 01/16/15 0841    Clinical Impression Statement Patient s/p right TKR on 12/26/14, doing well with gait, just very stiff, some significant edema.   Pt will benefit from skilled therapeutic intervention in order to improve on the following deficits Abnormal gait;Decreased activity tolerance;Decreased endurance;Decreased mobility;Increased edema;Decreased strength;Decreased range of motion;Difficulty walking;Pain   Rehab Potential Good   PT Frequency 3x / week   PT Duration 4 weeks   PT Treatment/Interventions Cryotherapy;Electrical Stimulation;Therapeutic activities;Functional mobility training;Stair training;Gait training;Therapeutic exercise;Balance training;Patient/family education;Manual techniques   PT Next Visit Plan add gym exercises         Problem List Patient Active Problem List   Diagnosis Date Noted  . OA (osteoarthritis) of knee 12/26/2014  . Agitation   . TIA (transient ischemic attack) 08/22/2014  . Tobacco abuse  08/22/2014  . Alcohol abuse 08/22/2014  . Chronic pain 08/22/2014  . Depression 08/22/2014  . Confusion   . Numbness 04/19/2013  . Acute medial meniscal tear  01/15/2012    Sumner Boast, PT 01/16/2015, 8:45 AM  [address] [place], [place], [address] Phone: [phone]   Fax:  [phone]

## 2015-01-18 ENCOUNTER — Ambulatory Visit: Payer: BLUE CROSS/BLUE SHIELD | Admitting: Physical Therapy

## 2015-01-18 ENCOUNTER — Encounter: Payer: Self-pay | Admitting: Physical Therapy

## 2015-01-18 DIAGNOSIS — M25561 Pain in right knee: Secondary | ICD-10-CM

## 2015-01-18 DIAGNOSIS — R262 Difficulty in walking, not elsewhere classified: Secondary | ICD-10-CM

## 2015-01-18 NOTE — Therapy (Signed)
[place] [place], [place], [address] Phone: [phone]   Fax:  [phone]  Physical Therapy Treatment  Patient Details  Name: Joseph Foster MRN: [id number] Date of Birth: [birth year] Referring Provider:  Gaynelle Arabian, MD  Encounter Date: 01/18/2015      PT End of Session - 01/18/15 0840    Visit Number 2   Date for PT Re-Evaluation 03/18/15   PT Start Time 0759   PT Stop Time 0845   PT Time Calculation (min) 46 min      Past Medical History  Diagnosis Date  . DDD (degenerative disc disease), lumbar L4  . Chronic back pain   . Arthritis KNEES  . Acute medial meniscus tear of left knee   . Depression   . Stroke     ? TIA   . GERD (gastroesophageal reflux disease)     Past Surgical History  Procedure Laterality Date  . Spinal cord stimulator implant  2009  . Right knee surg.  2005  . Knee arthroscopy  01/15/2012    Procedure: ARTHROSCOPY KNEE;  Surgeon: Gearlean Alf, MD;  Location: [facility];  Service: Orthopedics;  Laterality: Left;  meniscal debridement  . Tonsilectomy/adenoidectomy with myringotomy    . Vasectomy    . Hc eeg adult  08/23/2014       . 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  Visit Diagnosis:  Right knee pain  Difficulty walking      Subjective Assessment - 01/18/15 0800    Subjective Patient reports that he was very sore yesterday.  Walked about a half mile.  Seemed to stiffen up.   Limitations Standing;Walking;House hold activities   Currently in Pain? Yes   Pain Score 5    Pain Location Knee   Pain Orientation Right   Pain Descriptors / Indicators Aching;Tightness   Pain Type Acute pain                         [facility] Adult PT  Treatment/Exercise - 01/18/15 0001    Ambulation/Gait   Gait Comments practice gait without assistive device cue to bend knee, some stairs step over step   High Level Balance   High Level Balance Comments resisted gait all directions   Knee/Hip Exercises: Aerobic   Stationary Bike 5 minutes partial revs   Isokinetic Nustep L4 x 6 minutes   Knee/Hip Exercises: Machines for Strengthening   Cybex Knee Extension 5# 2x10   Cybex Knee Flexion 25# 2x10   Total Gym Leg Press 20# 3x10, then no weight for deeper flexion and then no weight with right leg only   Manual Therapy   Manual Therapy Myofascial release;Passive ROM   Myofascial Release scar mobilization   Passive ROM PROM of the right knee into flexion                   PT Short Term Goals - 01/16/15 5093    PT SHORT TERM GOAL #1   Title independent with initial HEP   Time 2   Period Weeks   Status New           PT Long Term Goals - 01/16/15 2671    PT LONG  TERM GOAL #1   Title walk 1000 feet without assistive device   Time 8   Period Weeks   Status New   PT LONG TERM GOAL #2   Title increase AROM of the right knee to 8-110 degrees flexion   Time 8   Period Weeks   Status New   PT LONG TERM GOAL #3   Title decrease pain 50%   Time 8   Period Weeks   Status New   PT LONG TERM GOAL #4   Title increase strength to WNL's   Time 8   Period Weeks   Status New               Plan - 01/18/15 0841    Clinical Impression Statement Stiff to start today, but did great on the bike with full revolutions, first few steps are with significant antalgia then he smooths out   PT Next Visit Plan continue to add exercises and work on ROM and gait   Consulted and Agree with Plan of Care Patient        Problem List Patient Active Problem List   Diagnosis Date Noted  . OA (osteoarthritis) of knee 12/26/2014  . Agitation   . TIA (transient ischemic attack) 08/22/2014  . Tobacco abuse 08/22/2014  . Alcohol  abuse 08/22/2014  . Chronic pain 08/22/2014  . Depression 08/22/2014  . Confusion   . Numbness 04/19/2013  . Acute medial meniscal tear 01/15/2012    Sumner Boast, PT 01/18/2015, 8:42 AM  [address] [place], [place], [address] Phone: [phone]   Fax:  [phone]

## 2015-01-20 ENCOUNTER — Encounter: Payer: Self-pay | Admitting: Physical Therapy

## 2015-01-20 ENCOUNTER — Ambulatory Visit: Payer: BLUE CROSS/BLUE SHIELD | Admitting: Physical Therapy

## 2015-01-20 DIAGNOSIS — M25561 Pain in right knee: Secondary | ICD-10-CM | POA: Diagnosis not present

## 2015-01-20 NOTE — Therapy (Signed)
[address] [place], [place], [address] Phone: [phone]   Fax:  [phone]  Physical Therapy Treatment  Patient Details  Name: Joseph Foster MRN: [id number] Date of Birth: [birth year] Referring Provider:  Gaynelle Arabian, MD  Encounter Date: 01/20/2015      PT End of Session - 01/20/15 0834    Visit Number 3   PT Start Time 0800   PT Stop Time 0845   PT Time Calculation (min) 45 min      Past Medical History  Diagnosis Date  . DDD (degenerative disc disease), lumbar L4  . Chronic back pain   . Arthritis KNEES  . Acute medial meniscus tear of left knee   . Depression   . Stroke     ? TIA   . GERD (gastroesophageal reflux disease)     Past Surgical History  Procedure Laterality Date  . Spinal cord stimulator implant  2009  . Right knee surg.  2005  . Knee arthroscopy  01/15/2012    Procedure: ARTHROSCOPY KNEE;  Surgeon: Gearlean Alf, MD;  Location: [facility];  Service: Orthopedics;  Laterality: Left;  meniscal debridement  . Tonsilectomy/adenoidectomy with myringotomy    . Vasectomy    . Hc eeg adult  08/23/2014       . 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  Visit Diagnosis:  Right knee pain  Difficulty walking      Subjective Assessment - 01/20/15 0801    Subjective pt amb in carrying SPC with good gait pattern, slightly antalgic on RT   Currently in Pain? Yes   Pain Score 4    Pain Location Knee   Pain Orientation Right   Pain Type Surgical pain            [facility] PT Assessment - 01/20/15 0001    AROM   Overall AROM Comments 10-112                     [facility] Adult PT Treatment/Exercise - 01/20/15 0001    Knee/Hip Exercises: Aerobic   Stationary Bike 6  min   Isokinetic Nustep L 5 6 min   Knee/Hip Exercises: Machines for Strengthening   Cybex Knee Extension 5# 2x10   Cybex Knee Flexion 25# 2x10   Total Gym Leg Press 30# 3x10, then calf raises   Knee/Hip Exercises: Plyometrics   Other Plyometric Exercises 3# up and over foam roll for func flexion fwd and lateral 2 sets 15   Other Plyometric Exercises 3# 8 inch step touch 2 sets 10                  PT Short Term Goals - 01/20/15 4010    PT SHORT TERM GOAL #1   Title independent with initial HEP   Status Achieved           PT Long Term Goals - 01/16/15 2725    PT LONG TERM GOAL #1   Title walk 1000 feet without assistive device   Time 8   Period Weeks   Status New   PT LONG TERM GOAL #2   Title increase AROM of the right knee to 8-110 degrees flexion   Time 8   Period  Weeks   Status New   PT LONG TERM GOAL #3   Title decrease pain 50%   Time 8   Period Weeks   Status New   PT LONG TERM GOAL #4   Title increase strength to WNL's   Time 8   Period Weeks   Status New       Pt stated he would ice at home.        Plan - 01/20/15 0835    Clinical Impression Statement pt with significant improvements in ROM, pt does fatigue easily with func strengthening activities   PT Next Visit Plan increase HEP as needed        Problem List Patient Active Problem List   Diagnosis Date Noted  . OA (osteoarthritis) of knee 12/26/2014  . Agitation   . TIA (transient ischemic attack) 08/22/2014  . Tobacco abuse 08/22/2014  . Alcohol abuse 08/22/2014  . Chronic pain 08/22/2014  . Depression 08/22/2014  . Confusion   . Numbness 04/19/2013  . Acute medial meniscal tear 01/15/2012    PAYSEUR,ANGIE PTA 01/20/2015, 8:37 AM  [address] [place], [place], [address] Phone: [phone]   Fax:  [phone]

## 2015-01-23 ENCOUNTER — Ambulatory Visit: Payer: BLUE CROSS/BLUE SHIELD | Admitting: Physical Therapy

## 2015-01-23 DIAGNOSIS — M25561 Pain in right knee: Secondary | ICD-10-CM | POA: Diagnosis not present

## 2015-01-23 DIAGNOSIS — M7989 Other specified soft tissue disorders: Secondary | ICD-10-CM

## 2015-01-23 DIAGNOSIS — R262 Difficulty in walking, not elsewhere classified: Secondary | ICD-10-CM

## 2015-01-23 NOTE — Therapy (Signed)
[place] [address] [place], [place], [address] Phone: [phone]   Fax:  [phone]  Physical Therapy Treatment  Patient Details  Name: Joseph Foster MRN: [id number] Date of Birth: 1946-02-26 Referring Provider:  Hulan Fess, MD  Encounter Date: 01/23/2015      PT End of Session - 01/23/15 0835    Visit Number 4   Date for PT Re-Evaluation 03/18/15   PT Start Time 1324   PT Stop Time 0835   PT Time Calculation (min) 38 min   Activity Tolerance Patient tolerated treatment well   Behavior During Therapy [facility] for tasks assessed/performed      Past Medical History  Diagnosis Date  . DDD (degenerative disc disease), lumbar L4  . Chronic back pain   . Arthritis KNEES  . Acute medial meniscus tear of left knee   . Depression   . Stroke     ? TIA   . GERD (gastroesophageal reflux disease)     Past Surgical History  Procedure Laterality Date  . Spinal cord stimulator implant  2009  . Right knee surg.  2005  . Knee arthroscopy  01/15/2012    Procedure: ARTHROSCOPY KNEE;  Surgeon: Gearlean Alf, MD;  Location: [facility];  Service: Orthopedics;  Laterality: Left;  meniscal debridement  . Tonsilectomy/adenoidectomy with myringotomy    . Vasectomy    . Hc eeg adult  08/23/2014       . 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  Visit Diagnosis:  Right knee pain  Difficulty walking  Swelling of limb      Subjective Assessment - 01/23/15 0806    Subjective stiff; no pain this AM; walked into clinic without pain   Patient Stated Goals no pain, return to work   Currently in Pain? No/denies                         [facility] Adult PT Treatment/Exercise - 01/23/15 0806    Knee/Hip Exercises:  Aerobic   Stationary Bike 6 min   Isokinetic Nustep L 5 6 min   Knee/Hip Exercises: Machines for Strengthening   Cybex Knee Extension 15# 3x10   Cybex Knee Flexion 25# x 10; 35# 2x10   Total Gym Leg Press 40# 3x10; calf raises 40# 3x10   Knee/Hip Exercises: Plyometrics   Other Plyometric Exercises 3# step over 4" and 6" step with taps to 8" step x 5  bil; cues to decrease R circumduction   Knee/Hip Exercises: Standing   Wall Squat 3 sets;10 reps;3 seconds   Wall Squat Limitations cues for improved control and decreased speed                  PT Short Term Goals - 01/20/15 0836    PT SHORT TERM GOAL #1   Title independent with initial HEP   Status Achieved           PT Long Term Goals - 01/23/15 0840    PT LONG TERM GOAL #1   Title walk 1000 feet without assistive device   Time 8   Period Weeks   Status On-going   PT LONG TERM GOAL #2   Title increase AROM of the right  knee to 8-110 degrees flexion   Time 8   Period Weeks   Status On-going   PT LONG TERM GOAL #3   Title decrease pain 50%   Time 8   Period Weeks   Status On-going   PT LONG TERM GOAL #4   Title increase strength to WNL's   Time 8   Period Weeks   Status On-going               Plan - 01/23/15 1610    Clinical Impression Statement Pt tolerated increased strengthening exercises well today.  Wall squats were difficult for pt.  Will continue to benefit from PT to maximize function.   PT Next Visit Plan continue functional activities   Consulted and Agree with Plan of Care Patient        Problem List Patient Active Problem List   Diagnosis Date Noted  . OA (osteoarthritis) of knee 12/26/2014  . Agitation   . TIA (transient ischemic attack) 08/22/2014  . Tobacco abuse 08/22/2014  . Alcohol abuse 08/22/2014  . Chronic pain 08/22/2014  . Depression 08/22/2014  . Confusion   . Numbness 04/19/2013  . Acute medial meniscal tear 01/15/2012   Laureen Abrahams, PT,  DPT 01/23/2015 8:41 AM  [address] [place], [place], [address] Phone: [phone]   Fax:  [phone]

## 2015-01-25 ENCOUNTER — Encounter: Payer: Self-pay | Admitting: Physical Therapy

## 2015-01-25 ENCOUNTER — Ambulatory Visit: Payer: BLUE CROSS/BLUE SHIELD | Admitting: Physical Therapy

## 2015-01-25 DIAGNOSIS — M25561 Pain in right knee: Secondary | ICD-10-CM | POA: Diagnosis not present

## 2015-01-25 DIAGNOSIS — M7989 Other specified soft tissue disorders: Secondary | ICD-10-CM

## 2015-01-25 DIAGNOSIS — R262 Difficulty in walking, not elsewhere classified: Secondary | ICD-10-CM

## 2015-01-25 NOTE — Therapy (Signed)
[place] [address] [place], [place], [address] Phone: [phone]   Fax:  [phone]  Physical Therapy Treatment  Patient Details  Name: RAMAN FEATHERSTON MRN: [id number] Date of Birth: 1945-10-09 Referring Provider:  Hulan Fess, MD  Encounter Date: 01/25/2015      PT End of Session - 01/25/15 0829    Visit Number 5   Date for PT Re-Evaluation 03/18/15   PT Start Time 0756   PT Stop Time 0846   PT Time Calculation (min) 50 min      Past Medical History  Diagnosis Date  . DDD (degenerative disc disease), lumbar L4  . Chronic back pain   . Arthritis KNEES  . Acute medial meniscus tear of left knee   . Depression   . Stroke     ? TIA   . GERD (gastroesophageal reflux disease)     Past Surgical History  Procedure Laterality Date  . Spinal cord stimulator implant  2009  . Right knee surg.  2005  . Knee arthroscopy  01/15/2012    Procedure: ARTHROSCOPY KNEE;  Surgeon: Gearlean Alf, MD;  Location: [facility];  Service: Orthopedics;  Laterality: Left;  meniscal debridement  . Tonsilectomy/adenoidectomy with myringotomy    . Vasectomy    . Hc eeg adult  08/23/2014       . 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  Visit Diagnosis:  Right knee pain  Difficulty walking  Swelling of limb      Subjective Assessment - 01/25/15 0759    Subjective Reports that after PT he is very sore for a day.   Currently in Pain? Yes   Pain Score 5    Pain Location Knee   Pain Orientation Right   Pain Descriptors / Indicators Aching   Pain Type Surgical pain                         [facility] Adult PT Treatment/Exercise - 01/25/15 0001    High Level Balance   High Level Balance Comments resisted gait  all directions   Knee/Hip Exercises: Aerobic   Stationary Bike Level 2 x 6 minutes   Isokinetic Nustep L 5 6 min   Knee/Hip Exercises: Machines for Strengthening   Cybex Knee Extension 10# 3x10   Cybex Knee Flexion 25# x 10; 35# 2x10   Total Gym Leg Press 30#   Manual Therapy   Myofascial Release scar mobilization   Passive ROM PROM of the right knee into flexion                 PT Education - 01/25/15 0829    Education provided Yes   Education Details RICE   Person(s) Educated Patient   Methods Explanation;Handout   Comprehension Verbalized understanding          PT Short Term Goals - 01/20/15 0836    PT SHORT TERM GOAL #1   Title independent with initial HEP   Status Achieved           PT Long Term Goals - 01/23/15 0840    PT LONG TERM GOAL #1   Title walk 1000 feet without assistive device   Time 8   Period Weeks  Status On-going   PT LONG TERM GOAL #2   Title increase AROM of the right knee to 8-110 degrees flexion   Time 8   Period Weeks   Status On-going   PT LONG TERM GOAL #3   Title decrease pain 50%   Time 8   Period Weeks   Status On-going   PT LONG TERM GOAL #4   Title increase strength to WNL's   Time 8   Period Weeks   Status On-going               Plan - 01/25/15 0830    Clinical Impression Statement Fatigues easily, has pain with some of the activities, did the stairs reciprocally without issue.  Some tightness in the knee   PT Next Visit Plan Will write MD note next week   Consulted and Agree with Plan of Care Patient        Problem List Patient Active Problem List   Diagnosis Date Noted  . OA (osteoarthritis) of knee 12/26/2014  . Agitation   . TIA (transient ischemic attack) 08/22/2014  . Tobacco abuse 08/22/2014  . Alcohol abuse 08/22/2014  . Chronic pain 08/22/2014  . Depression 08/22/2014  . Confusion   . Numbness 04/19/2013  . Acute medial meniscal tear 01/15/2012    Sumner Boast,  PT 01/25/2015, 8:36 AM  [place] [address] [place], [place], [address] Phone: [phone]   Fax:  [phone]

## 2015-01-27 ENCOUNTER — Encounter: Payer: Self-pay | Admitting: Physical Therapy

## 2015-01-27 ENCOUNTER — Ambulatory Visit: Payer: BLUE CROSS/BLUE SHIELD | Admitting: Physical Therapy

## 2015-01-27 DIAGNOSIS — M25561 Pain in right knee: Secondary | ICD-10-CM | POA: Diagnosis not present

## 2015-01-27 NOTE — Therapy (Signed)
[place], [place], [address] Phone: [phone]   Fax:  [phone]  Physical Therapy Treatment  Patient Details  Name: Joseph Foster MRN: [id number] Date of Birth: 04-Jun-1946 Referring Provider:  Hulan Fess, MD  Encounter Date: 01/27/2015      PT End of Session - 01/27/15 0824    Visit Number 6   PT Start Time 0800   PT Stop Time 0850   PT Time Calculation (min) 50 min      Past Medical History  Diagnosis Date  . DDD (degenerative disc disease), lumbar L4  . Chronic back pain   . Arthritis KNEES  . Acute medial meniscus tear of left knee   . Depression   . Stroke     ? TIA   . GERD (gastroesophageal reflux disease)     Past Surgical History  Procedure Laterality Date  . Spinal cord stimulator implant  2009  . Right knee surg.  2005  . Knee arthroscopy  01/15/2012    Procedure: ARTHROSCOPY KNEE;  Surgeon: Gearlean Alf, MD;  Location: [facility];  Service: Orthopedics;  Laterality: Left;  meniscal debridement  . Tonsilectomy/adenoidectomy with myringotomy    . Vasectomy    . Hc eeg adult  08/23/2014       . 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  Visit Diagnosis:  Right knee pain      Subjective Assessment - 01/27/15 0824    Subjective felt really good after last session,looser. walked dog around park and did well            [facility] PT Assessment - 01/27/15 0001    ROM / Strength   AROM / PROM / Strength AROM;Strength   AROM   Overall AROM Comments 1-120   Strength   Overall Strength Comments 5/5   Strength Assessment Site Knee   Right/Left Knee Right                     [facility] Adult PT Treatment/Exercise - 01/27/15 0001    Ambulation/Gait   Gait  Comments amb 800 feet plus without AD , neg all surfaces without gait deficiets   Knee/Hip Exercises: Aerobic   Stationary Bike Nustep L6 58mn   Elliptical L2 6 min   Knee/Hip Exercises: Machines for Strengthening   Cybex Knee Extension 10# 3x10   Cybex Knee Flexion 25# x 10; 35# 2x10   Total Gym Leg Press 30#   Manual Therapy   Manual Therapy Myofascial release;Passive ROM   Passive ROM PROM of the right knee into flexion and ext                  PT Short Term Goals - 01/20/15 06568   PT SHORT TERM GOAL #1   Title independent with initial HEP   Status Achieved           PT Long Term Goals - 01/27/15 0825    PT LONG TERM GOAL #1   Title walk 1000 feet without assistive device   Status Achieved   PT LONG TERM GOAL #2   Title increase AROM of the right knee to 8-110 degrees flexion   Status Achieved   PT LONG TERM  GOAL #3   Title decrease pain 50%   Baseline pt states 75%   Status Achieved   PT LONG TERM GOAL #4   Title increase strength to WNL's   Baseline 5/5   Status Achieved               Plan - 01/27/15 0350    Clinical Impression Statement all goals met, excellent improvements in ROM and strength WFLS. Seeing MD 5/3 and will work next week to assure HEP and rec. D/C   PT Next Visit Plan write MD note next session,rec D/C with HEP        Problem List Patient Active Problem List   Diagnosis Date Noted  . OA (osteoarthritis) of knee 12/26/2014  . Agitation   . TIA (transient ischemic attack) 08/22/2014  . Tobacco abuse 08/22/2014  . Alcohol abuse 08/22/2014  . Chronic pain 08/22/2014  . Depression 08/22/2014  . Confusion   . Numbness 04/19/2013  . Acute medial meniscal tear 01/15/2012    PAYSEUR,ANGIE PTA 01/27/2015, 8:29 AM  [address] [place], [place], [address] Phone: [phone]   Fax:  [phone]

## 2015-01-30 ENCOUNTER — Ambulatory Visit: Payer: BLUE CROSS/BLUE SHIELD | Admitting: Physical Therapy

## 2015-01-30 ENCOUNTER — Ambulatory Visit: Payer: BLUE CROSS/BLUE SHIELD | Attending: Orthopedic Surgery | Admitting: Physical Therapy

## 2015-01-30 DIAGNOSIS — M25561 Pain in right knee: Secondary | ICD-10-CM

## 2015-01-30 DIAGNOSIS — Z96651 Presence of right artificial knee joint: Secondary | ICD-10-CM | POA: Insufficient documentation

## 2015-01-30 DIAGNOSIS — M7989 Other specified soft tissue disorders: Secondary | ICD-10-CM | POA: Diagnosis not present

## 2015-01-30 DIAGNOSIS — R262 Difficulty in walking, not elsewhere classified: Secondary | ICD-10-CM | POA: Diagnosis not present

## 2015-01-30 NOTE — Patient Instructions (Addendum)
  Step-Up: Lateral   Step up to side with right leg. Bring other foot up onto _6-8___ inch step. Return to floor position with left leg. Repeat _10___ times per session. Do _2-3___ sessions per day.  Copyright  VHI. All rights reserved.  Forward   Facing step, place right leg on step, flexed at hip. Step up slowly, bringing hips in line with knee and shoulder. Bring other foot onto step. Reverse process to step back down.  Do 10____ repetitions, __2-3__ times per day.  http://bt.exer.us/154   Copyright  VHI. All rights reserved.   Ankle: Calf Raise   Stand with forefoot on step, heels toward floor. Raise heels as far as possible. Use hand support for balance. Repeat __10__ times per set. Do _1___ sets per session. Do _2-3__ sessions per day.  Copyright  VHI. All rights reserved.   Advance Knee Strengthener   Leaning on wall, bend knees and slowly lower body.  Hold 3 seconds. Return. Inhale while lowering, and exhale while raising the body. Repeat __10__ times. Do __2-3__ sessions per day.  http://gt2.exer.us/271   Copyright  VHI. All rights reserved.   POSITION: Single Leg Balance: Contralateral Hip Abducted   Stand on right leg. Lift opposite leg out sideways. Hold _15__ seconds. _3-5 reps _2-3__ times per day.  http://ggbe.exer.us/128   Copyright  VHI. All rights reserved.    Can walk forwards, backwards and sideways with green band around ankles.

## 2015-01-30 NOTE — Therapy (Signed)
[place], [place], [address] Phone: [phone]   Fax:  [phone]  Physical Therapy Treatment  Patient Details  Name: Joseph Foster MRN: [id number] Date of Birth: 1946/02/03 Referring Provider:  Hulan Fess, MD  Encounter Date: 01/30/2015      PT End of Session - 01/30/15 1357    Visit Number 7   Date for PT Re-Evaluation 03/18/15   PT Start Time 1315   PT Stop Time 1357   PT Time Calculation (min) 42 min   Activity Tolerance Patient tolerated treatment well   Behavior During Therapy [facility] for tasks assessed/performed      Past Medical History  Diagnosis Date  . DDD (degenerative disc disease), lumbar L4  . Chronic back pain   . Arthritis KNEES  . Acute medial meniscus tear of left knee   . Depression   . Stroke     ? TIA   . GERD (gastroesophageal reflux disease)     Past Surgical History  Procedure Laterality Date  . Spinal cord stimulator implant  2009  . Right knee surg.  2005  . Knee arthroscopy  01/15/2012    Procedure: ARTHROSCOPY KNEE;  Surgeon: Gearlean Alf, MD;  Location: [facility];  Service: Orthopedics;  Laterality: Left;  meniscal debridement  . Tonsilectomy/adenoidectomy with myringotomy    . Vasectomy    . Hc eeg adult  08/23/2014       . 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  Visit Diagnosis:  Right knee pain  Difficulty walking  Swelling of limb      Subjective Assessment - 01/30/15 1319    Subjective Saturday wasn't a good day; had a hard time standing.  Today is better   Currently in Pain? Yes   Pain Score 4    Pain Location Knee   Pain Orientation Right   Pain Descriptors / Indicators Aching   Pain Type Surgical pain   Pain Onset More than a  month ago   Pain Frequency Intermittent   Aggravating Factors  sitting or walking   Pain Relieving Factors ice            [facility] PT Assessment - 01/30/15 1331    AROM   Overall AROM Comments 1-120                     [facility] Adult PT Treatment/Exercise - 01/30/15 1320    Knee/Hip Exercises: Aerobic   Stationary Bike Nustep L6 32min   Elliptical L2 6 min rec bike   Knee/Hip Exercises: Machines for Strengthening   Cybex Knee Extension 10# 3x10   Cybex Knee Flexion 25# 2x10; 35# x 10   Knee/Hip Exercises: Standing   Heel Raises 10 reps   Heel Raises Limitations on 6" step   Lateral Step Up Right;10 reps   Forward Step Up Right;10 reps   Wall Squat 10 reps;3 seconds   SLS x 15 sec RLE; with L hip hike x 10   Other Standing Knee Exercises resistive walking with green theraband: monster walks,    Other Standing Knee Exercises standing theraband with green theraband x 10 bil hip 4 way  PT Short Term Goals - 01/20/15 0836    PT SHORT TERM GOAL #1   Title independent with initial HEP   Status Achieved           PT Long Term Goals - 01/27/15 0825    PT LONG TERM GOAL #1   Title walk 1000 feet without assistive device   Status Achieved   PT LONG TERM GOAL #2   Title increase AROM of the right knee to 8-110 degrees flexion   Status Achieved   PT LONG TERM GOAL #3   Title decrease pain 50%   Baseline pt states 75%   Status Achieved   PT LONG TERM GOAL #4   Title increase strength to WNL's   Baseline 5/5   Status Achieved               Plan - 01/30/15 1357    Clinical Impression Statement Progressed exercises for home and pt able to tolerate without increase in pain.  Anticipate d/c next 1-2 visits with transition to HEP.     PT Next Visit Plan assess response to HEP; see what MD says; anticipate d/c next 1-2 visits.   Consulted and Agree with Plan of Care Patient        Problem List Patient Active Problem List    Diagnosis Date Noted  . OA (osteoarthritis) of knee 12/26/2014  . Agitation   . TIA (transient ischemic attack) 08/22/2014  . Tobacco abuse 08/22/2014  . Alcohol abuse 08/22/2014  . Chronic pain 08/22/2014  . Depression 08/22/2014  . Confusion   . Numbness 04/19/2013  . Acute medial meniscal tear 01/15/2012   Laureen Abrahams, PT, DPT 01/30/2015 2:01 PM  [place] [address] [place], [place], [address] Phone: [phone]   Fax:  [phone]

## 2015-01-31 ENCOUNTER — Telehealth: Payer: Self-pay

## 2015-01-31 NOTE — Telephone Encounter (Signed)
MD says he does not need anymore PT

## 2015-02-01 ENCOUNTER — Ambulatory Visit: Payer: BLUE CROSS/BLUE SHIELD | Admitting: Physical Therapy

## 2015-02-03 ENCOUNTER — Ambulatory Visit: Payer: BLUE CROSS/BLUE SHIELD | Admitting: Physical Therapy

## 2015-02-07 ENCOUNTER — Ambulatory Visit: Payer: BLUE CROSS/BLUE SHIELD | Admitting: Physical Therapy

## 2015-02-09 ENCOUNTER — Ambulatory Visit: Payer: BLUE CROSS/BLUE SHIELD | Admitting: Physical Therapy

## 2015-02-09 ENCOUNTER — Encounter: Payer: Self-pay | Admitting: Neurology

## 2015-02-09 ENCOUNTER — Ambulatory Visit (INDEPENDENT_AMBULATORY_CARE_PROVIDER_SITE_OTHER): Payer: BLUE CROSS/BLUE SHIELD | Admitting: Neurology

## 2015-02-09 VITALS — BP 124/86 | HR 78 | Wt 247.0 lb

## 2015-02-09 DIAGNOSIS — G451 Carotid artery syndrome (hemispheric): Secondary | ICD-10-CM | POA: Diagnosis not present

## 2015-02-09 NOTE — Progress Notes (Signed)
[facility] [address] [place]. [place] [address] [phone]       OFFICE FOLLOW-UP NOTE  [name] Date of Birth:  [birth year] Medical Record Number:  [id number]   HPI: 7 year Caucasian male seen today for the first office follow-up visit following hospital consultation for TIA. He was admitted on 08/22/14 with confusion and asking the same question over and over. He was also disoriented and at one point his wife felt that he had a left facial droop. His symptoms resolved shortly after admission and hence he was not considered for TPA. CT scan of the brain done on 2 separate days did not even an acute stroke and MRI could not be done because of spinal cord stimulator. Carotid ultrasound showed no significant extra-axial stenosis. Total cholesterol was normal except for LDL which was borderline at 88 mg percent. Hemoglobin A1c was 5.6. Urine drug screen was positive only for benzos. Patient had been on aspirin 81 mg the dose of which was increased to 325 and he is discharged home. He was started on simvastatin 5 mg daily but there appears to be some confusion and he also has a prescription with him for 20 mg and is not sure which one is taking at present. Is tolerating aspirin without bleeding or bruising and simvastatin without significant myalgias. He had recent knee replacement surgery and still has some pain but has been able to walk with a cane. He walks 2 miles every day and in fact has lost 10 pounds since his knee surgery but he does not exercise regularly.  ROS:   14 system review of systems is positive for  knee pain, gait difficulty, ringing in the ears, insomnia, constipation, daytime sleepiness, back pain, memory loss, tremors and depression.  PMH:  Past Medical History  Diagnosis Date  . DDD (degenerative disc disease), lumbar L4  . Chronic back pain   . Arthritis KNEES  . Acute medial meniscus tear of left knee   . Depression   . Stroke     ?  TIA   . GERD (gastroesophageal reflux disease)   . Ringing in ears   . Insomnia   . Memory loss   . Depression     Social History:  History   Social History  . Marital Status: Married    Spouse Name: N/A  . Number of Children: 3  . Years of Education: N/A   Occupational History  . [employer]    Social History Main Topics  . Smoking status: Former Smoker    Types: Cigars    Quit date: 01/09/2001  . Smokeless tobacco: Current User    Types: Snuff  . Alcohol Use: 12.6 oz/week    7 Cans of beer, 14 Shots of liquor per week     Comment: 02/09/15 beer in summer, liquor in winter  . Drug Use: No  . Sexual Activity: Not on file   Other Topics Concern  . Not on file   Social History Narrative    Medications:   Current Outpatient Prescriptions on File Prior to Visit  Medication Sig Dispense Refill  . ALPRAZolam (XANAX) 0.5 MG tablet Take 0.5 mg by mouth 2 (two) times daily as needed for anxiety.    [name] morphine (MS CONTIN) 30 MG 12 hr tablet Take 30 mg by mouth every 12 (twelve) hours.    [name] morphine (MSIR) 15 MG tablet Take 15 mg by mouth every 4 (four) hours as needed for moderate pain  or severe pain.    [name] PARoxetine (PAXIL) 40 MG tablet Take 40 mg by mouth every morning.    . polyethylene glycol powder (GLYCOLAX/MIRALAX) powder Take 17 g by mouth at bedtime.    . simvastatin (ZOCOR) 5 MG tablet Take 5 mg by mouth daily at 6 PM.    . sodium chloride (OCEAN) 0.65 % SOLN nasal spray Place 1 spray into both nostrils as needed for congestion.    . tamsulosin (FLOMAX) 0.4 MG CAPS capsule Take 0.4 mg by mouth daily.     No current facility-administered medications on file prior to visit.    Allergies:   Allergies  Allergen Reactions  . Ceclor [Cefaclor] Itching  . Codeine Other (See Comments)    Hyper activity   . Dilaudid [Hydromorphone Hcl] Other (See Comments)    hallucinations  . Penicillins Hives  . Oxycontin [Oxycodone Hcl] Rash    Physical Exam General: Mildly obese  middle aged Caucasian male, seated, in no evident distress Head: head normocephalic and atraumatic.  Neck: supple with no carotid or supraclavicular bruits Cardiovascular: regular rate and rhythm, no murmurs Musculoskeletal: no deformity Skin:  no rash/petichiae Vascular:  Normal pulses all extremities Filed Vitals:   02/09/15 1508  BP: 124/86  Pulse: 78   Neurologic Exam Mental Status: Awake and fully alert. Oriented to place and time. Recent and remote memory intact. Attention span, concentration and fund of knowledge appropriate. Mood and affect appropriate.  Cranial Nerves: Fundoscopic exam not doneisc  . Pupils equal, briskly reactive to light. Extraocular movements full without nystagmus. Visual fields full to confrontation. Hearing intact. Facial sensation intact. Face, tongue, palate moves normally and symmetrically.  Motor: Normal bulk and tone. Normal strength in all tested extremity muscles. Sensory.: intact to touch ,pinprick .position and vibratory sensation.  Coordination: Rapid alternating movements normal in all extremities. Finger-to-nose and heel-to-shin performed accurately bilaterally. Gait and Station: Arises from chair without difficulty. Stance is normal. Gait demonstrates normal stride length and balance but favors her right knee due to recent surgery. . Unable to heel, toe and tandem walk without difficulty.  Reflexes: 1+ and symmetric. Toes downgoing.   NIHSS 0 Modified Rankin  1   ASSESSMENT: 77 year Caucasian male with a right brain TIA in November 2015 with vascular risk factors of hyperlipidemia and mild obesity only.    PLAN: I had a long d/w patient about his recent TIA, risk for recurrent stroke/TIAs, personally independently reviewed imaging studies and stroke evaluation results and answered questions.Continue aspirin 325 mg orally every day  for secondary stroke prevention and maintain strict control of hypertension with blood pressure goal below  130/90, diabetes with hemoglobin A1c goal below 6.5% and lipids with LDL cholesterol goal below 100 mg/dL. I recommend he take simvastatin 5 mg daily for next 1 month and have follow-up lipid profile checked to aim for LDL cholesterol goal below 70 mg percent. I also advised the patient to eat a healthy diet with plenty of whole grains, cereals, fruits and vegetables, exercise regularly and maintain ideal body weight Followup in the future with Joseph Givens, Joseph Foster in 6 months or call earlier if necessary  Antony Contras, MD  Note: This document was prepared with digital dictation and possible smart phrase technology. Any transcriptional errors that result from this process are unintentional

## 2015-02-09 NOTE — Patient Instructions (Signed)
I had a long d/w patient about his recent TIA, risk for recurrent stroke/TIAs, personally independently reviewed imaging studies and stroke evaluation results and answered questions.Continue aspirin 325 mg orally every day  for secondary stroke prevention and maintain strict control of hypertension with blood pressure goal below 130/90, diabetes with hemoglobin A1c goal below 6.5% and lipids with LDL cholesterol goal below 100 mg/dL. I also advised the patient to eat a healthy diet with plenty of whole grains, cereals, fruits and vegetables, exercise regularly and maintain ideal body weight Followup in the future with Ward Givens, NP in 6 months or call earlier if necessary  Stroke Prevention Some medical conditions and behaviors are associated with an increased chance of having a stroke. You may prevent a stroke by making healthy choices and managing medical conditions. HOW CAN I REDUCE MY RISK OF HAVING A STROKE?   Stay physically active. Get at least 30 minutes of activity on most or all days.  Do not smoke. It may also be helpful to avoid exposure to secondhand smoke.  Limit alcohol use. Moderate alcohol use is considered to be:  No more than 2 drinks per day for men.  No more than 1 drink per day for nonpregnant women.  Eat healthy foods. This involves:  Eating 5 or more servings of fruits and vegetables a day.  Making dietary changes that address high blood pressure (hypertension), high cholesterol, diabetes, or obesity.  Manage your cholesterol levels.  Making food choices that are high in fiber and low in saturated fat, trans fat, and cholesterol may control cholesterol levels.  Take any prescribed medicines to control cholesterol as directed by your health care provider.  Manage your diabetes.  Controlling your carbohydrate and sugar intake is recommended to manage diabetes.  Take any prescribed medicines to control diabetes as directed by your health care  provider.  Control your hypertension.  Making food choices that are low in salt (sodium), saturated fat, trans fat, and cholesterol is recommended to manage hypertension.  Take any prescribed medicines to control hypertension as directed by your health care provider.  Maintain a healthy weight.  Reducing calorie intake and making food choices that are low in sodium, saturated fat, trans fat, and cholesterol are recommended to manage weight.  Stop drug abuse.  Avoid taking birth control pills.  Talk to your health care provider about the risks of taking birth control pills if you are over 45 years old, smoke, get migraines, or have ever had a blood clot.  Get evaluated for sleep disorders (sleep apnea).  Talk to your health care provider about getting a sleep evaluation if you snore a lot or have excessive sleepiness.  Take medicines only as directed by your health care provider.  For some people, aspirin or blood thinners (anticoagulants) are helpful in reducing the risk of forming abnormal blood clots that can lead to stroke. If you have the irregular heart rhythm of atrial fibrillation, you should be on a blood thinner unless there is a good reason you cannot take them.  Understand all your medicine instructions.  Make sure that other conditions (such as anemia or atherosclerosis) are addressed. SEEK IMMEDIATE MEDICAL CARE IF:   You have sudden weakness or numbness of the face, arm, or leg, especially on one side of the body.  Your face or eyelid droops to one side.  You have sudden confusion.  You have trouble speaking (aphasia) or understanding.  You have sudden trouble seeing in one or both eyes.  You have sudden trouble walking.  You have dizziness.  You have a loss of balance or coordination.  You have a sudden, severe headache with no known cause.  You have new chest pain or an irregular heartbeat. Any of these symptoms may represent a serious problem that is  an emergency. Do not wait to see if the symptoms will go away. Get medical help at once. Call your local emergency services (911 in [place] Do not drive yourself to the hospital. Document Released: 10/24/2004 Document Revised: 01/31/2014 Document Reviewed: 03/19/2013 [facility] Patient Information 2015 [place], [place]. This information is not intended to replace advice given to you by your health care provider. Make sure you discuss any questions you have with your health care provider.

## 2015-04-18 ENCOUNTER — Ambulatory Visit: Payer: BLUE CROSS/BLUE SHIELD | Attending: Orthopedic Surgery | Admitting: Physical Therapy

## 2015-04-18 DIAGNOSIS — M545 Low back pain, unspecified: Secondary | ICD-10-CM

## 2015-04-18 DIAGNOSIS — G8929 Other chronic pain: Secondary | ICD-10-CM | POA: Insufficient documentation

## 2015-04-18 NOTE — Therapy (Signed)
[address] [place], [place], [address] Phone: [phone]   Fax:  [phone]  Physical Therapy Evaluation  Patient Details  Name: Joseph Foster MRN: [id number] Date of Birth: [birth year] Referring Provider:  Kandace Blitz, MD  Encounter Date: 04/18/2015      PT End of Session - 04/18/15 1020    Visit Number 1   Date for PT Re-Evaluation 06/19/15   PT Start Time 0927   PT Stop Time 1004   PT Time Calculation (min) 37 min   Activity Tolerance Patient tolerated treatment well   Behavior During Therapy [facility] for tasks assessed/performed      Past Medical History  Diagnosis Date  . DDD (degenerative disc disease), lumbar L4  . Chronic back pain   . Arthritis KNEES  . Acute medial meniscus tear of left knee   . Depression   . Stroke     ? TIA   . GERD (gastroesophageal reflux disease)   . Ringing in ears   . Insomnia   . Memory loss   . Depression     Past Surgical History  Procedure Laterality Date  . Spinal cord stimulator implant  2009  . Right knee surg.  2005  . Knee arthroscopy  01/15/2012    Procedure: ARTHROSCOPY KNEE;  Surgeon: Gearlean Alf, MD;  Location: [facility];  Service: Orthopedics;  Laterality: Left;  meniscal debridement  . Tonsilectomy/adenoidectomy with myringotomy    . Vasectomy    . Hc eeg adult  08/23/2014       . 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  Visit Diagnosis:  Chronic LBP - Plan: PT plan of care cert/re-cert      Subjective Assessment - 04/18/15 0928    Subjective Pt states he has had chronic back pain, saw his dr and they wanted to do some burning of the nerves, has hx of DDD L4-5.  The insurance company is requiring therapy before undergoing  this treatment option.  Previous job required a lot of sitting and that really irritated the back  but now he has a standing position.  Pt still has stimulator in the back and that really  helps   Pertinent History R TKR 3/16   Limitations Sitting;Standing   How long can you sit comfortably? 1 hour   How long can you stand comfortably? 4hrs   How long can you walk comfortably? 4hrs   Patient Stated Goals decrease pain, stretching   Currently in Pain? Yes  took morphine   Pain Score 2    Pain Location Back   Pain Orientation Mid   Pain Descriptors / Indicators Dull   Pain Type Chronic pain   Pain Onset More than a month ago   Aggravating Factors  sitting, standing for long periods   Pain Relieving Factors stimulator, moving, stretching            [facility] PT Assessment - 04/18/15 0001    Assessment   Medical Diagnosis LBP   Next MD Visit 2 months   Prior Therapy no   Balance Screen   Has the patient fallen in the past 6 months No   [place] residence   Living Arrangements Spouse/significant other  Available Help at Discharge Family   Type of [place] to enter   Entrance Stairs-Number of Steps 21   Entrance Stairs-Rails Right;Left   Home Layout Two level   Prior Function   Level of Independence Independent   Vocation Part time employment   Vocation Requirements standing, walking   Leisure walk dog   ROM / Strength   AROM / PROM / Strength AROM;PROM;Strength   AROM   Overall AROM Comments Pt has trunk flex/ext/SB/rotation WFL, knee ext/flex WNL, hip flex/IR/ER WFL  no deficits noted, slight increase in ache with trunk ext   PROM   Overall PROM  Within functional limits for tasks performed   Overall PROM Comments supine PROM no limitations noted, good hip flex, piriformis, IR/ER of hip, knee flex   Strength   Overall Strength Comments all strength WFL except R hip flex slight decrease at 4/5   Right Hip  Flexion 4/5   Right Hip Extension 5/5   Right Hip ABduction 5/5   Right Hip ADduction 5/5   Left Hip Flexion 5/5   Left Hip Extension 5/5   Left Hip ABduction 5/5   Left Hip ADduction 5/5   Right Knee Flexion 5/5   Right Knee Extension 5/5   Left Knee Flexion 5/5   Left Knee Extension 5/5   Lumbar Flexion 5/5   Palpation   Spinal mobility PA mobes L1-s2 normal motion, slight increase in dull ache at L5-S1, slight pain PA in thoracic area T7   SI assessment  no pain with compression, gapping, palpation   Ambulation/Gait   Gait Comments periodic right lean                           PT Education - 04/18/15 1020    Education provided Yes   Education Details williams flexion exercises, doorways stretch for TFL, piriformis stretching    Person(s) Educated Patient   Methods Explanation;Demonstration;Tactile cues;Verbal cues;Handout   Comprehension Verbalized understanding;Returned demonstration;Verbal cues required;Tactile cues required;Need further instruction          PT Short Term Goals - 04/18/15 1026    PT SHORT TERM GOAL #1   Title independent with initial HEP   Time 2   Period Weeks   Status New           PT Long Term Goals - 04/18/15 1027    PT LONG TERM GOAL #1   Title Pt will be able to walk greater than 2 hours with minimal pain   Time 8   Period Weeks   Status New   PT LONG TERM GOAL #2   Title Pt will increase R hip flexion strength to 5/5 to decrease muscle imbalances   Time 8   Period Weeks   Status New   PT LONG TERM GOAL #3   Title decrease pain 50% following working obligations   Time 8   Period Weeks   Status New               Plan - 04/18/15 1022    Clinical Impression Statement Pt  presents to outpatient ortho complaining of chronic LBP with no sciatic involvement.  Pt has spinal stimulator in place which helps his pain.  He changed from a sitting job 11 hours a day to a standing position which he works part time  4hrs a day.  He is fine walking during that time but when he  gets in his car to sit down and get back out his pain is increased.  Pt has good ROM with stretching at end range and Strength of LE//hip/trunk.  Pt stated he is considering having some nerves burned but is required to do therapy befor approval.     Pt will benefit from skilled therapeutic intervention in order to improve on the following deficits Abnormal gait;Decreased activity tolerance;Decreased balance;Decreased endurance;Decreased range of motion;Decreased strength;Difficulty walking;Hypomobility;Pain   Rehab Potential Good   PT Frequency 2x / week   PT Duration 8 weeks   PT Treatment/Interventions ADLs/Self Care Home Management;Cryotherapy;Electrical Stimulation;Traction;Ultrasound;Gait training;Stair training;Functional mobility training;Therapeutic activities;Therapeutic exercise;Balance training;Neuromuscular re-education;Patient/family education;Manual techniques;Passive range of motion   PT Next Visit Plan work on LE strength, hip flexibility, williams flexion exercises, possible spinal mobes to L5-S1 as well as thoracic area.   PT Home Exercise Plan williams flexion exercises, piriformis/TFL stretching   Consulted and Agree with Plan of Care Patient         Problem List Patient Active Problem List   Diagnosis Date Noted  . OA (osteoarthritis) of knee 12/26/2014  . Agitation   . TIA (transient ischemic attack) 08/22/2014  . Tobacco abuse 08/22/2014  . Alcohol abuse 08/22/2014  . Chronic pain 08/22/2014  . Depression 08/22/2014  . Confusion   . Numbness 04/19/2013  . Acute medial meniscal tear 01/15/2012    Sumner Boast., PT 04/18/2015, 11:05 AM  [address] [place], [place], [address] Phone: [phone]   Fax:  [phone]

## 2015-04-20 ENCOUNTER — Ambulatory Visit: Payer: BLUE CROSS/BLUE SHIELD | Admitting: Physical Therapy

## 2015-04-25 ENCOUNTER — Encounter: Payer: Self-pay | Admitting: Physical Therapy

## 2015-04-25 ENCOUNTER — Ambulatory Visit: Payer: BLUE CROSS/BLUE SHIELD | Admitting: Physical Therapy

## 2015-04-25 DIAGNOSIS — M545 Low back pain, unspecified: Secondary | ICD-10-CM

## 2015-04-25 DIAGNOSIS — G8929 Other chronic pain: Secondary | ICD-10-CM

## 2015-04-25 NOTE — Therapy (Signed)
[address] [place], [place], [address] Phone: [phone]   Fax:  [phone]  Physical Therapy Treatment  Patient Details  Name: Joseph Foster MRN: [id number] Date of Birth: Mar 23, 1946 Referring Provider:  Kandace Blitz, MD  Encounter Date: 04/25/2015      PT End of Session - 04/25/15 0912    Visit Number 2   Date for PT Re-Evaluation 06/19/15   PT Start Time 0840   PT Stop Time 0920   PT Time Calculation (min) 40 min      Past Medical History  Diagnosis Date  . DDD (degenerative disc disease), lumbar L4  . Chronic back pain   . Arthritis KNEES  . Acute medial meniscus tear of left knee   . Depression   . Stroke     ? TIA   . GERD (gastroesophageal reflux disease)   . Ringing in ears   . Insomnia   . Memory loss   . Depression     Past Surgical History  Procedure Laterality Date  . Spinal cord stimulator implant  2009  . Right knee surg.  2005  . Knee arthroscopy  01/15/2012    Procedure: ARTHROSCOPY KNEE;  Surgeon: Gearlean Alf, MD;  Location: [facility];  Service: Orthopedics;  Laterality: Left;  meniscal debridement  . Tonsilectomy/adenoidectomy with myringotomy    . Vasectomy    . Hc eeg adult  08/23/2014       . 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  Visit Diagnosis:  Chronic LBP      Subjective Assessment - 04/25/15 0852    Subjective about the same, doing stretches from eval   Pain Score 4    Pain Location Back                         [facility] Adult PT Treatment/Exercise - 04/25/15 0001    Exercises   Exercises Lumbar   Lumbar Exercises: Machines for Strengthening   Other Lumbar Machine Exercise lat pull 25# 2 sets 15   Stationary Bike Nustep  L6 30min   Lumbar Exercises: Standing   Functional Squats 10 reps  weighted ball   Lumbar Exercises: Supine   Other Supine Lumbar Exercises bridge with ball, KTC and obl 2 sets 15   Knee/Hip Exercises: Machines for Strengthening   Total Gym Leg Press 40# 2 sets 15   Knee/Hip Exercises: Plyometrics   Other Plyometric Exercises OH trunk ext 2 set s10   Other Plyometric Exercises 10# pulley rotation 2 set s10   Knee/Hip Exercises: Standing   Other Standing Knee Exercises 10# pulleys scaption 3 way 15 times each   Other Standing Knee Exercises green tband hip ext and abd 2 set s10                  PT Short Term Goals - 04/25/15 0914    PT SHORT TERM GOAL #1   Title independent with initial HEP   Status Achieved           PT Long Term Goals - 04/18/15 1027    PT LONG TERM GOAL #1   Title Pt will be able to walk greater than 2 hours with minimal pain  Time 8   Period Weeks   Status New   PT LONG TERM GOAL #2   Title Pt will increase R hip flexion strength to 5/5 to decrease muscle imbalances   Time 8   Period Weeks   Status New   PT LONG TERM GOAL #3   Title decrease pain 50% following working obligations   Time 8   Period Weeks   Status New               Plan - 04/25/15 0913    Clinical Impression Statement pt tolerated strengthening ther ex with minimal increase in pain, VCing needed for correct BM with ex.   PT Next Visit Plan INCREASE HEP for STRENGTH, hips and LB/core        Problem List Patient Active Problem List   Diagnosis Date Noted  . OA (osteoarthritis) of knee 12/26/2014  . Agitation   . TIA (transient ischemic attack) 08/22/2014  . Tobacco abuse 08/22/2014  . Alcohol abuse 08/22/2014  . Chronic pain 08/22/2014  . Depression 08/22/2014  . Confusion   . Numbness 04/19/2013  . Acute medial meniscal tear 01/15/2012    Hadja Harral,ANGIE PTA 04/25/2015, 9:15 AM  [address] [place], [place], [address] Phone: [phone]   Fax:  [phone]

## 2015-04-27 ENCOUNTER — Ambulatory Visit: Payer: BLUE CROSS/BLUE SHIELD | Admitting: Physical Therapy

## 2015-05-02 ENCOUNTER — Ambulatory Visit: Payer: BLUE CROSS/BLUE SHIELD | Attending: Orthopedic Surgery | Admitting: Physical Therapy

## 2015-05-02 ENCOUNTER — Encounter: Payer: Self-pay | Admitting: Physical Therapy

## 2015-05-02 DIAGNOSIS — M545 Low back pain, unspecified: Secondary | ICD-10-CM

## 2015-05-02 DIAGNOSIS — G8929 Other chronic pain: Secondary | ICD-10-CM | POA: Insufficient documentation

## 2015-05-02 NOTE — Therapy (Signed)
[name] [place], [place], [address] Phone: [phone]   Fax:  [phone]  Physical Therapy Treatment  Patient Details  Name: Joseph Foster MRN: [id number] Date of Birth: 1946/03/13 Referring Provider:  Kandace Blitz, MD  Encounter Date: 05/02/2015      PT End of Session - 05/02/15 1008    Visit Number 3   Date for PT Re-Evaluation 06/19/15   PT Start Time 0931   PT Stop Time 1010   PT Time Calculation (min) 39 min   Activity Tolerance Patient tolerated treatment well   Behavior During Therapy [facility] for tasks assessed/performed      Past Medical History  Diagnosis Date  . DDD (degenerative disc disease), lumbar L4  . Chronic back pain   . Arthritis KNEES  . Acute medial meniscus tear of left knee   . Depression   . Stroke     ? TIA   . GERD (gastroesophageal reflux disease)   . Ringing in ears   . Insomnia   . Memory loss   . Depression     Past Surgical History  Procedure Laterality Date  . Spinal cord stimulator implant  2009  . Right knee surg.  2005  . Knee arthroscopy  01/15/2012    Procedure: ARTHROSCOPY KNEE;  Surgeon: Gearlean Alf, MD;  Location: [facility];  Service: Orthopedics;  Laterality: Left;  meniscal debridement  . Tonsilectomy/adenoidectomy with myringotomy    . Vasectomy    . Hc eeg adult  08/23/2014       . 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  Visit Diagnosis:  Chronic LBP      Subjective Assessment - 05/02/15 0932    Subjective Pt repots soreness in his back.    Patient Stated Goals decrease pain, stretching   Currently in Pain? Yes   Pain Score 6    Pain Location Back   Pain Orientation Mid   Pain Descriptors / Indicators Constant;Aching   Pain Type Chronic pain   Pain Onset More than a month ago                         [facility] Adult PT Treatment/Exercise - 05/02/15 0001    Lumbar Exercises: Machines for Strengthening   Leg Press #100 3 sets 10 reps    Other Lumbar Machine Exercise Seated Row #45 3 sets 10 reps, Lat pull #45 10 reps 3 sets    Lumbar Exercises: Standing   Functional Squats 10 reps  3 sets with physo ball    Other Standing Lumbar Exercises Standing AR press #25 3 sets 10 reps    Lumbar Exercises: Seated   Sit to Stand 10 reps  2 sets with overhead press with blue weighted ball   Lumbar Exercises: Supine   Bridge 2 seconds;15 reps;Other (comment)  2 sets    Other Supine Lumbar Exercises SLR 15 reps 2 sets    Other Supine Lumbar Exercises single leg bridge 10 reps 2 sets    Knee/Hip Exercises: Aerobic   Stationary Bike Nustep L6 45min                  PT Short Term Goals - 04/25/15 9449  PT SHORT TERM GOAL #1   Title independent with initial HEP   Status Achieved           PT Long Term Goals - 05/02/15 1010    PT LONG TERM GOAL #1   Title Pt will be able to walk greater than 2 hours with minimal pain   Status On-going   PT LONG TERM GOAL #2   Title Pt will increase R hip flexion strength to 5/5 to decrease muscle imbalances   PT LONG TERM GOAL #3   Title decrease pain 50% following working obligations   Status On-going               Plan - 05/02/15 1009    Clinical Impression Statement Pt tolerated treatment well. Pt able to tolerate increased therapeutic interventions with increased weight   Pt will benefit from skilled therapeutic intervention in order to improve on the following deficits Abnormal gait;Decreased activity tolerance;Decreased balance;Decreased endurance;Decreased range of motion;Decreased strength;Difficulty walking;Hypomobility;Pain   Rehab Potential Good   PT Frequency 2x / week   PT Duration 8 weeks   PT Treatment/Interventions  ADLs/Self Care Home Management;Cryotherapy;Electrical Stimulation;Traction;Ultrasound;Gait training;Stair training;Functional mobility training;Therapeutic activities;Therapeutic exercise;Balance training;Neuromuscular re-education;Patient/family education;Manual techniques;Passive range of motion   PT Next Visit Plan INCREASE HEP for STRENGTH, hips and LB/core        Problem List Patient Active Problem List   Diagnosis Date Noted  . OA (osteoarthritis) of knee 12/26/2014  . Agitation   . TIA (transient ischemic attack) 08/22/2014  . Tobacco abuse 08/22/2014  . Alcohol abuse 08/22/2014  . Chronic pain 08/22/2014  . Depression 08/22/2014  . Confusion   . Numbness 04/19/2013  . Acute medial meniscal tear 01/15/2012    Scot Jun, PTA  05/02/2015, 10:11 AM  [address] [place], [place], [address] Phone: [phone]   Fax:  [phone]

## 2015-05-04 ENCOUNTER — Ambulatory Visit: Payer: BLUE CROSS/BLUE SHIELD | Admitting: Physical Therapy

## 2015-05-08 ENCOUNTER — Encounter: Payer: Self-pay | Admitting: Physical Therapy

## 2015-05-08 ENCOUNTER — Ambulatory Visit: Payer: BLUE CROSS/BLUE SHIELD | Admitting: Physical Therapy

## 2015-05-08 DIAGNOSIS — M545 Low back pain, unspecified: Secondary | ICD-10-CM

## 2015-05-08 DIAGNOSIS — G8929 Other chronic pain: Secondary | ICD-10-CM

## 2015-05-08 NOTE — Therapy (Signed)
[address] [place], [place], [address] Phone: [phone]   Fax:  [phone]  Physical Therapy Treatment  Patient Details  Name: Joseph Foster MRN: [id number] Date of Birth: 06-27-1946 Referring Provider:  Kandace Blitz, MD  Encounter Date: 05/08/2015      PT End of Session - 05/08/15 0925    Visit Number 3   Date for PT Re-Evaluation 06/19/15   PT Start Time 0846   PT Stop Time 0925   PT Time Calculation (min) 39 min   Activity Tolerance Patient tolerated treatment well   Behavior During Therapy [facility] for tasks assessed/performed      Past Medical History  Diagnosis Date  . DDD (degenerative disc disease), lumbar L4  . Chronic back pain   . Arthritis KNEES  . Acute medial meniscus tear of left knee   . Depression   . Stroke     ? TIA   . GERD (gastroesophageal reflux disease)   . Ringing in ears   . Insomnia   . Memory loss   . Depression     Past Surgical History  Procedure Laterality Date  . Spinal cord stimulator implant  2009  . Right knee surg.  2005  . Knee arthroscopy  01/15/2012    Procedure: ARTHROSCOPY KNEE;  Surgeon: Gearlean Alf, MD;  Location: [facility];  Service: Orthopedics;  Laterality: Left;  meniscal debridement  . Tonsilectomy/adenoidectomy with myringotomy    . Vasectomy    . Hc eeg adult  08/23/2014       . 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  Visit Diagnosis:  Chronic LBP      Subjective Assessment - 05/08/15 0849    Subjective Pt reports that some days are good some are bad. Not much soreness today    Pertinent History R TKR 3/16   Currently in Pain? No/denies   Pain Score 0-No pain   Pain Location Back   Pain Orientation Lower                          [facility] Adult PT Treatment/Exercise - 05/08/15 0001    Lumbar Exercises: Machines for Strengthening   Leg Press #105 3 sets 10 reps    Other Lumbar Machine Exercise Seated Row #55 3 sets 10 reps, Lat pull #55 10 reps 3 sets    Lumbar Exercises: Standing   Other Standing Lumbar Exercises Standing AR press #35 2 sets 10 reps    Lumbar Exercises: Seated   Sit to Stand 10 reps  blue weighted ball with overhead press    Lumbar Exercises: Supine   Bridge 2 seconds;15 reps;Other (comment)  2 sets    Other Supine Lumbar Exercises SLR 15 reps 2 sets    Other Supine Lumbar Exercises single leg bridge 10 reps 2 sets    Knee/Hip Exercises: Aerobic   Stationary Bike Nustep L6 55min                  PT Short Term Goals - 04/25/15 0914    PT SHORT TERM GOAL #1   Title independent with initial HEP   Status Achieved           PT  Long Term Goals - 05/08/15 0930    PT LONG TERM GOAL #1   Title Pt will be able to walk greater than 2 hours with minimal pain   Status On-going   PT LONG TERM GOAL #3   Title decrease pain 50% following working obligations   Status On-going               Plan - 05/08/15 0926    Clinical Impression Statement Pt performed well in PT today but does fatigue during exercise. Pt able to perform all interventions with increased intensities without subjective reports of increase pain.   Pt will benefit from skilled therapeutic intervention in order to improve on the following deficits Abnormal gait;Decreased activity tolerance;Decreased balance;Decreased endurance;Decreased range of motion;Decreased strength;Difficulty walking;Hypomobility;Pain   Rehab Potential Good   PT Treatment/Interventions ADLs/Self Care Home Management;Cryotherapy;Electrical Stimulation;Traction;Ultrasound;Gait training;Stair training;Functional mobility training;Therapeutic activities;Therapeutic exercise;Balance training;Neuromuscular  re-education;Patient/family education;Manual techniques;Passive range of motion   PT Next Visit Plan INCREASE HEP for STRENGTH, hips and LB/core        Problem List Patient Active Problem List   Diagnosis Date Noted  . OA (osteoarthritis) of knee 12/26/2014  . Agitation   . TIA (transient ischemic attack) 08/22/2014  . Tobacco abuse 08/22/2014  . Alcohol abuse 08/22/2014  . Chronic pain 08/22/2014  . Depression 08/22/2014  . Confusion   . Numbness 04/19/2013  . Acute medial meniscal tear 01/15/2012    Scot Jun, PTA  05/08/2015, 9:31 AM  [address] [place], [place], [address] Phone: [phone]   Fax:  [phone]

## 2015-05-09 ENCOUNTER — Ambulatory Visit: Payer: BLUE CROSS/BLUE SHIELD | Admitting: Physical Therapy

## 2015-05-15 ENCOUNTER — Encounter: Payer: Self-pay | Admitting: Physical Therapy

## 2015-05-15 ENCOUNTER — Ambulatory Visit: Payer: BLUE CROSS/BLUE SHIELD | Admitting: Physical Therapy

## 2015-05-15 DIAGNOSIS — M545 Low back pain, unspecified: Secondary | ICD-10-CM

## 2015-05-15 DIAGNOSIS — G8929 Other chronic pain: Secondary | ICD-10-CM

## 2015-05-15 NOTE — Therapy (Signed)
[address] [place], [place], [address] Phone: [phone]   Fax:  [phone]  Physical Therapy Treatment  Patient Details  Name: Joseph Foster MRN: [id number] Date of Birth: Mar 23, 1946 Referring Provider:  Kandace Blitz, MD  Encounter Date: 05/15/2015      PT End of Session - 05/15/15 0913    Visit Number 5   Date for PT Re-Evaluation 06/19/15   PT Start Time 0835   PT Stop Time 0914   PT Time Calculation (min) 39 min   Activity Tolerance Patient tolerated treatment well   Behavior During Therapy [facility] for tasks assessed/performed      Past Medical History  Diagnosis Date  . DDD (degenerative disc disease), lumbar L4  . Chronic back pain   . Arthritis KNEES  . Acute medial meniscus tear of left knee   . Depression   . Stroke     ? TIA   . GERD (gastroesophageal reflux disease)   . Ringing in ears   . Insomnia   . Memory loss   . Depression     Past Surgical History  Procedure Laterality Date  . Spinal cord stimulator implant  2009  . Right knee surg.  2005  . Knee arthroscopy  01/15/2012    Procedure: ARTHROSCOPY KNEE;  Surgeon: Gearlean Alf, MD;  Location: [facility];  Service: Orthopedics;  Laterality: Left;  meniscal debridement  . Tonsilectomy/adenoidectomy with myringotomy    . Vasectomy    . Hc eeg adult  08/23/2014       . 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  Visit Diagnosis:  Chronic LBP      Subjective Assessment - 05/15/15 0837    Subjective Pt reports that he has been doing good and his back is much better    Pertinent History R TKR 3/16   Pain Score 2    Pain Location Back   Pain Orientation Lower                         [facility] Adult PT  Treatment/Exercise - 05/15/15 0001    Lumbar Exercises: Machines for Strengthening   Leg Press #120 3 sets 10 reps    Other Lumbar Machine Exercise Seated Row #65 2 sets 15 reps, Lat pull #65 15 reps 2 sets    Lumbar Exercises: Standing   Other Standing Lumbar Exercises Standing AR press #45 2 sets 10 reps    Lumbar Exercises: Seated   Sit to Stand 15 reps  with blue weighted ball with overhead lift    Lumbar Exercises: Supine   Bridge 2 seconds;15 reps;Other (comment)  with ab set    Other Supine Lumbar Exercises SLR 15 reps with ab set    Lumbar Exercises: Quadruped   Plank 30 sec 3 sets    Knee/Hip Exercises: Aerobic   Stationary Bike Nustep L6 54mn                  PT Short Term Goals - 04/25/15 0914    PT SHORT TERM GOAL #1   Title independent with initial HEP   Status Achieved           PT Long Term Goals -  05/15/15 0913    PT LONG TERM GOAL #1   Title Pt will be able to walk greater than 2 hours with minimal pain   Status Achieved   PT LONG TERM GOAL #3   Title decrease pain 50% following working obligations   Status Partially Met   PT LONG TERM GOAL #4   Title increase strength to WNL's               Plan - 05/15/15 0914    Clinical Impression Statement Pt tolerated treatment well. Pt reports decrease back pain and overall better mobility. Pt able to complete additional core strengthening interventions such as prone plank on elbows without issue   Pt will benefit from skilled therapeutic intervention in order to improve on the following deficits Abnormal gait;Decreased activity tolerance;Decreased balance;Decreased endurance;Decreased range of motion;Decreased strength;Difficulty walking;Hypomobility;Pain   PT Frequency 2x / week   PT Duration 8 weeks   PT Next Visit Plan INCREASE HEP for STRENGTH, hips and LB/core        Problem List Patient Active Problem List   Diagnosis Date Noted  . OA (osteoarthritis) of knee 12/26/2014  .  Agitation   . TIA (transient ischemic attack) 08/22/2014  . Tobacco abuse 08/22/2014  . Alcohol abuse 08/22/2014  . Chronic pain 08/22/2014  . Depression 08/22/2014  . Confusion   . Numbness 04/19/2013  . Acute medial meniscal tear 01/15/2012    Scot Jun, PTA  05/15/2015, 9:17 AM  [address] [place], [place], [address] Phone: [phone]   Fax:  [phone]

## 2015-05-16 ENCOUNTER — Ambulatory Visit: Payer: BLUE CROSS/BLUE SHIELD | Admitting: Physical Therapy

## 2015-05-22 ENCOUNTER — Ambulatory Visit: Payer: BLUE CROSS/BLUE SHIELD | Admitting: Physical Therapy

## 2015-05-22 ENCOUNTER — Encounter: Payer: Self-pay | Admitting: Physical Therapy

## 2015-05-22 DIAGNOSIS — M545 Low back pain, unspecified: Secondary | ICD-10-CM

## 2015-05-22 DIAGNOSIS — G8929 Other chronic pain: Secondary | ICD-10-CM

## 2015-05-22 NOTE — Therapy (Addendum)
[address] [place], [place], [address] Phone: [phone]   Fax:  [phone]  Physical Therapy Treatment  Patient Details  Name: Joseph Foster MRN: [id number] Date of Birth: October 20, 1945 Referring Provider:  Kandace Blitz, MD  Encounter Date: 05/22/2015      PT End of Session - 05/22/15 0925    Visit Number 9   Date for PT Re-Evaluation 06/19/15   PT Start Time 0849   PT Stop Time 0928   PT Time Calculation (min) 39 min   Activity Tolerance Patient tolerated treatment well   Behavior During Therapy [facility] [place] [facility] for tasks assessed/performed      Past Medical History  Diagnosis Date  . DDD (degenerative disc disease), lumbar L4  . Chronic back pain   . Arthritis KNEES  . Acute medial meniscus tear of left knee   . Depression   . Stroke     ? TIA   . GERD (gastroesophageal reflux disease)   . Ringing in ears   . Insomnia   . Memory loss   . Depression     Past Surgical History  Procedure Laterality Date  . Spinal cord stimulator implant  2009  . Right knee surg.  2005  . Knee arthroscopy  01/15/2012    Procedure: ARTHROSCOPY KNEE;  Surgeon: Gearlean Alf, MD;  Location: [facility];  Service: Orthopedics;  Laterality: Left;  meniscal debridement  . Tonsilectomy/adenoidectomy with myringotomy    . Vasectomy    . Hc eeg adult  08/23/2014       . 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  Visit Diagnosis:  Chronic LBP      Subjective Assessment - 05/22/15 0851    Subjective Pt report "My back has been doing great"   Pertinent History R TKR 3/16   Currently in Pain? No/denies   Pain Score 0-No pain   Pain Location Back            [facility] PT Assessment - 05/22/15 0001    Strength   Right Hip Flexion 5/5                     [facility] Adult PT Treatment/Exercise - 05/22/15 0001    Lumbar Exercises: Aerobic   Elliptical I5 R5 x6 min    Lumbar Exercises: Machines for Strengthening   Leg Press #130 3 sets 10 reps    Other Lumbar Machine Exercise Lat pull #75 10 reps 3 sets    Other Lumbar Machine Exercise Standing rows #95 10 reps 3 sets, standing straight arm pull downs 10 reps 3 sets #65    Lumbar Exercises: Standing   Other Standing Lumbar Exercises Standing AR press #55 2 sets 10 reps    Lumbar Exercises: Seated   Sit to Stand 15 reps  overhead lift with blue weighted ball    Lumbar Exercises: Supine   Bridge 2 seconds;Other (comment);20 reps   Other Supine Lumbar Exercises bilat SLR 15 reps 2 sets    Other Supine Lumbar Exercises single leg bridge 10 reps 2 sets                   PT Short Term Goals - 04/25/15 0914    PT SHORT  TERM GOAL #1   Title independent with initial HEP   Status Achieved           PT Long Term Goals - 05/22/15 0919    PT LONG TERM GOAL #2   Title Pt will increase R hip flexion strength to 5/5 to decrease muscle imbalances   Status Achieved   PT LONG TERM GOAL #3   Title decrease pain 50% following working obligations   Status Achieved               Plan - 05/22/15 2440    Clinical Impression Statement PT tolerated treatment great and able to progress with exercises. Pt has met some goals reporting less pain after work. Pt does report LBP at times but it has improved    Pt will benefit from skilled therapeutic intervention in order to improve on the following deficits Abnormal gait;Decreased activity tolerance;Decreased balance;Decreased endurance;Decreased range of motion;Decreased strength;Difficulty walking;Hypomobility;Pain   PT Frequency 2x / week   PT Duration 8 weeks   PT Treatment/Interventions ADLs/Self Care Home Management;Cryotherapy;Electrical Stimulation;Traction;Ultrasound;Gait  training;Stair training;Functional mobility training;Therapeutic activities;Therapeutic exercise;Balance training;Neuromuscular re-education;Patient/family education;Manual techniques;Passive range of motion   PT Next Visit Plan access for d/c        Problem List Patient Active Problem List   Diagnosis Date Noted  . OA (osteoarthritis) of knee 12/26/2014  . Agitation   . TIA (transient ischemic attack) 08/22/2014  . Tobacco abuse 08/22/2014  . Alcohol abuse 08/22/2014  . Chronic pain 08/22/2014  . Depression 08/22/2014  . Confusion   . Numbness 04/19/2013  . Acute medial meniscal tear 01/15/2012   PHYSICAL THERAPY DISCHARGE SUMMARY  Visits from Start of Care: 9  Plan: Patient agrees to discharge.  Patient goals were met. Patient is being discharged due to being pleased with the current functional level.  ?????       Scot Jun, PTA 05/22/2015, 9:29 AM  [address] [place], [place], [address] Phone: [phone]   Fax:  [phone]

## 2015-05-26 ENCOUNTER — Ambulatory Visit: Payer: BLUE CROSS/BLUE SHIELD | Admitting: Physical Therapy

## 2015-08-14 ENCOUNTER — Encounter: Payer: Self-pay | Admitting: Adult Health

## 2015-08-14 ENCOUNTER — Ambulatory Visit (INDEPENDENT_AMBULATORY_CARE_PROVIDER_SITE_OTHER): Payer: BLUE CROSS/BLUE SHIELD | Admitting: Adult Health

## 2015-08-14 VITALS — BP 135/91 | HR 77 | Ht 74.0 in | Wt 254.5 lb

## 2015-08-14 DIAGNOSIS — Z8673 Personal history of transient ischemic attack (TIA), and cerebral infarction without residual deficits: Secondary | ICD-10-CM

## 2015-08-14 NOTE — Patient Instructions (Addendum)
Continue on aspirin 325 mg Maintain strict control of the blood pressure with goal less than 130/90 Cholesterol LDL less than 100 Hemoglobin A1c less than 6.5% Continue monitoring diet and exercising  If you have any stroke-like symptoms call 911 immediately.

## 2015-08-14 NOTE — Progress Notes (Signed)
PATIENT: Joseph Foster DOB: 09-17-1946  REASON FOR VISIT: follow up- history of TIA HISTORY FROM: patient  HISTORY OF PRESENT ILLNESS: Joseph Foster is a 69 year old male with a history of TIA in 2015. He returns today for follow-up. He is currently taking aspirin 325 mg and tolerating it well. Denies any significant bruising or bleeding. The patient's primary care has been managing his hypertension and hyperlipidemia. The patient's blood pressure has been under relatively good control. The patient denies any additional strokelike symptoms since last seen. Patient states that he has been walking 3-5 miles daily. Patient states that he has not been monitoring his diet however he does not typically eat fried food. Patient does state that during the summer he drank quite a bit of alcohol however he does not do this during the winter. He denies any new neurological symptoms. He returns today for an evaluation.  HISTORY 02/09/15 (Joseph Foster): 48 year Caucasian male seen today for the first office follow-up visit following hospital consultation for TIA. He was admitted on 08/22/14 with confusion and asking the same question over and over. He was also disoriented and at one point his wife felt that he had a left facial droop. His symptoms resolved shortly after admission and hence he was not considered for TPA. CT scan of the brain done on 2 separate days did not even an acute stroke and MRI could not be done because of spinal cord stimulator. Carotid ultrasound showed no significant extra-axial stenosis. Total cholesterol was normal except for LDL which was borderline at 88 mg percent. Hemoglobin A1c was 5.6. Urine drug screen was positive only for benzos. Patient had been on aspirin 81 mg the dose of which was increased to 325 and he is discharged home. He was started on simvastatin 5 mg daily but there appears to be some confusion and he also has a prescription with him for 20 mg and is not sure which one is  taking at present. Is tolerating aspirin without bleeding or bruising and simvastatin without significant myalgias. He had recent knee replacement surgery and still has some pain but has been able to walk with a cane. He walks 2 miles every day and in fact has lost 10 pounds since his knee surgery but he does not exercise regularly.  REVIEW OF SYSTEMS: Out of a complete 14 system review of symptoms, the patient complains only of the following symptoms, and all other reviewed systems are negative.  Constipation, frequent waking, daytime sleepiness, should 4, back pain, joint pain, ringing in ears  ALLERGIES: Allergies  Allergen Reactions  . Ceclor [Cefaclor] Itching  . Codeine Other (See Comments)    Hyper activity   . Dilaudid [Hydromorphone Hcl] Other (See Comments)    hallucinations  . Penicillins Hives  . Oxycontin [Oxycodone Hcl] Rash    HOME MEDICATIONS: Outpatient Prescriptions Prior to Visit  Medication Sig Dispense Refill  . ALPRAZolam (XANAX) 0.5 MG tablet Take 0.5 mg by mouth 2 (two) times daily as needed for anxiety.    [name] aspirin 325 MG tablet Take 325 mg by mouth daily.    [name] morphine (MS CONTIN) 30 MG 12 hr tablet Take 30 mg by mouth every 12 (twelve) hours.    [name] morphine (MSIR) 15 MG tablet Take 15 mg by mouth every 4 (four) hours as needed for moderate pain or severe pain.    [name] PARoxetine (PAXIL) 40 MG tablet Take 40 mg by mouth every morning.    . polyethylene glycol  powder (GLYCOLAX/MIRALAX) powder Take 17 g by mouth at bedtime.    . sodium chloride (OCEAN) 0.65 % SOLN nasal spray Place 1 spray into both nostrils as needed for congestion.    . tamsulosin (FLOMAX) 0.4 MG CAPS capsule Take 0.4 mg by mouth daily.    . simvastatin (ZOCOR) 5 MG tablet Take 5 mg by mouth daily at 6 PM.     No facility-administered medications prior to visit.    PAST MEDICAL HISTORY: Past Medical History  Diagnosis Date  . DDD (degenerative disc disease), lumbar L4  . Chronic back pain    . Arthritis KNEES  . Acute medial meniscus tear of left knee   . Depression   . Stroke     ? TIA   . GERD (gastroesophageal reflux disease)   . Ringing in ears   . Insomnia   . Memory loss   . Depression     PAST SURGICAL HISTORY: Past Surgical History  Procedure Laterality Date  . Spinal cord stimulator implant  2009  . Right knee surg.  2005  . Knee arthroscopy  01/15/2012    Procedure: ARTHROSCOPY KNEE;  Surgeon: Gearlean Alf, MD;  Location: [facility];  Service: Orthopedics;  Laterality: Left;  meniscal debridement  . Tonsilectomy/adenoidectomy with myringotomy    . Vasectomy    . Hc eeg adult  08/23/2014       . Total knee arthroplasty Right 12/26/2014    Procedure: TOTAL RIGHT  KNEE ARTHROPLASTY;  Surgeon: Gaynelle Arabian, MD;  Location: [facility];  Service: Orthopedics;  Laterality: Right;    FAMILY HISTORY: Family History  Problem Relation Age of Onset  . Heart disease Father   . Leukemia Mother   . Hyperlipidemia Mother     SOCIAL HISTORY: Social History   Social History  . Marital Status: Married    Spouse Name: N/A  . Number of Children: 3  . Years of Education: N/A   Occupational History  . [employer]    Social History Main Topics  . Smoking status: Former Smoker    Types: Cigars    Quit date: 01/09/2001  . Smokeless tobacco: Current User    Types: Snuff  . Alcohol Use: 12.6 oz/week    7 Cans of beer, 14 Shots of liquor per week     Comment: 02/09/15 beer in summer, liquor in winter  . Drug Use: No  . Sexual Activity: Not on file   Other Topics Concern  . Not on file   Social History Narrative      PHYSICAL EXAM  Filed Vitals:   08/14/15 0927  BP: 135/91  Pulse: 77  Height: 6\' 2"  (1.88 m)  Weight: 254 lb 8 oz (115.44 kg)   Body mass index is 32.66 kg/(m^2).  Generalized: Well developed, in no acute distress   Neurological examination  Mentation: Alert oriented to time, place, history taking. Follows all commands  speech and language fluent Cranial nerve II-XII: Pupils were equal round reactive to light. Extraocular movements were full, visual field were full on confrontational test. Facial sensation and strength were normal. Uvula tongue midline. Head turning and shoulder shrug  were normal and symmetric. Motor: The motor testing reveals 5 over 5 strength of all 4 extremities. Good symmetric motor tone is noted throughout.  Sensory: Sensory testing is intact to soft touch on all 4 extremities. No evidence of extinction is noted.  Coordination: Cerebellar testing reveals good finger-nose-finger and heel-to-shin bilaterally.  Gait and station:  Gait is normal. Tandem gait is normal. Romberg is negative. No drift is seen.  Reflexes: Deep tendon reflexes are symmetric and normal bilaterally.   DIAGNOSTIC DATA (LABS, IMAGING, TESTING) - I reviewed patient records, labs, notes, testing and imaging myself where available.  Lab Results  Component Value Date   WBC 9.4 01/08/2015   HGB 10.9* 01/08/2015   HCT 32.0* 01/08/2015   MCV 92.9 01/08/2015   PLT 503* 01/08/2015      Component Value Date/Time   NA 138 01/08/2015 1946   K 3.5 01/08/2015 1946   CL 101 01/08/2015 1946   CO2 25 01/08/2015 1632   GLUCOSE 124* 01/08/2015 1948   BUN 19 01/08/2015 1946   CREATININE 1.10 01/08/2015 1948   CALCIUM 9.7 01/08/2015 1632   PROT 7.3 12/19/2014 1100   ALBUMIN 4.2 12/19/2014 1100   AST 21 12/19/2014 1100   ALT 19 12/19/2014 1100   ALKPHOS 26* 12/19/2014 1100   BILITOT 0.6 12/19/2014 1100   GFRNONAA 63* 01/08/2015 1632   GFRAA 73* 01/08/2015 1632   Lab Results  Component Value Date   CHOL 165 08/23/2014   HDL 51 08/23/2014   LDLCALC 88 08/23/2014   TRIG 131 08/23/2014   CHOLHDL 3.2 08/23/2014   Lab Results  Component Value Date   HGBA1C 5.6 08/22/2014      ASSESSMENT AND PLAN 69 y.o. year old male  has a past medical history of DDD (degenerative disc disease), lumbar (L4); Chronic back pain;  Arthritis (KNEES); Acute medial meniscus tear of left knee; Depression; Stroke; GERD (gastroesophageal reflux disease); Ringing in ears; Insomnia; Memory loss; and Depression. here with:  1. History is TIA  Overall the patient is doing well. He will continue on aspirin 325 mg daily. Patient should maintain strict control of his blood pressure with goal less than 130/90. Cholesterol LDL less than 100 and hemoglobin A1c less than 6.5%. I encouraged the patient to continue to monitor his diet and try to exercise regularly. Patient verbalized understanding. If he has any strokelike symptoms he should call 911 immediately. He will follow-up in 6 months or sooner if needed.  Ward Givens, MSN, NP-C 08/14/2015, 9:29 AM [facility] [address] [place], [place] [address] [phone]

## 2015-08-14 NOTE — Progress Notes (Signed)
I have read the note, and I agree with the clinical assessment and plan.  Nicholas Trompeter KEITH   

## 2015-09-01 ENCOUNTER — Encounter: Payer: Self-pay | Admitting: Cardiovascular Disease

## 2015-09-01 ENCOUNTER — Ambulatory Visit (INDEPENDENT_AMBULATORY_CARE_PROVIDER_SITE_OTHER): Payer: BLUE CROSS/BLUE SHIELD | Admitting: Cardiovascular Disease

## 2015-09-01 VITALS — BP 124/90 | HR 68 | Ht 74.0 in | Wt 253.0 lb

## 2015-09-01 DIAGNOSIS — I5042 Chronic combined systolic (congestive) and diastolic (congestive) heart failure: Secondary | ICD-10-CM

## 2015-09-01 DIAGNOSIS — I5022 Chronic systolic (congestive) heart failure: Secondary | ICD-10-CM

## 2015-09-01 MED ORDER — LOSARTAN POTASSIUM 25 MG PO TABS: 25.0000 mg | ORAL_TABLET | Freq: Every day | ORAL | Status: DC

## 2015-09-01 NOTE — Patient Instructions (Signed)
Medication Instructions:  START Losartan 25 mg once daily   Labwork: Your physician recommends that you return for lab work in: 3 weeks for basic metabolic panel   Testing/Procedures: Your physician has requested that you have an echocardiogram in 6 months 1 week before office visit with Dr. Acie Fredrickson. Echocardiography is a painless test that uses sound waves to create images of your heart. It provides your doctor with information about the size and shape of your heart and how well your heart's chambers and valves are working. This procedure takes approximately one hour. There are no restrictions for this procedure.    Follow-Up: Your physician wants you to follow-up in: 6 months with Dr. Acie Fredrickson.   You will receive a reminder letter in the mail two months in advance. If you don't receive a letter, please call our office to schedule the follow-up appointment.   If you need a refill on your cardiac medications before your next appointment, please call your pharmacy.   Thank you for choosing [facility]! Christen Bame, RN [phone]

## 2015-09-01 NOTE — Progress Notes (Signed)
Cardiology Office Note   Date:  09/01/2015   ID:  Joseph Foster, DOB [birth year], MRN [id number]  PCP:  Gennette Pac, MD  Cardiologist:   Thayer Headings, MD   Chief Complaint  Patient presents with  . Shortness of Breath   Problem list 1. Essential hypertension 2. 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 He has  grade 1 diastolic dysfunction. ( Echo Nov. 2015)  3. COPD 4. History of sleep apnea 5. Anxiety/depression 6. Hyperlipidemia 7.  TIA - on ASA 325 for his TIA    History of Present Illness: Joseph Foster is a 69 y.o. male who presents for evaluation of CHF Has not had and dyspnea.   Walks 3-5 a day , Monday - Friday . No PND or orthopnea. No CP , no syncope.   Still eats some salt  - eats soup once a week, hot dogs once a month. Eats deli meats that have some salt   Non-smoker Drinks lots of beer -  4-5 a day.  More on the weekend.     Past Medical History  Diagnosis Date  . DDD (degenerative disc disease), lumbar L4  . Chronic back pain   . Arthritis KNEES  . Acute medial meniscus tear of left knee   . Depression   . Stroke [facility])     ? TIA   . GERD (gastroesophageal reflux disease)   . Ringing in ears   . Insomnia   . Memory loss   . Depression   . TIA (transient ischemic attack) 2015    Past Surgical History  Procedure Laterality Date  . Spinal cord stimulator implant  2009  . Right knee surg.  2005  . Knee arthroscopy  01/15/2012    Procedure: ARTHROSCOPY KNEE;  Surgeon: Gearlean Alf, MD;  Location: [facility];  Service: Orthopedics;  Laterality: Left;  meniscal debridement  . Tonsilectomy/adenoidectomy with myringotomy    . Vasectomy    . Hc eeg adult  08/23/2014       . Total knee arthroplasty Right 12/26/2014    Procedure: TOTAL RIGHT  KNEE ARTHROPLASTY;  Surgeon: Gaynelle Arabian, MD;  Location: [facility];  Service: Orthopedics;  Laterality: Right;     Current  Outpatient Prescriptions  Medication Sig Dispense Refill  . ALPRAZolam (XANAX) 0.5 MG tablet Take 0.5 mg by mouth 2 (two) times daily as needed for anxiety.     [name] aspirin 325 MG tablet Take 325 mg by mouth daily.    [name] ibuprofen (ADVIL,MOTRIN) 200 MG tablet Take 200 mg by mouth every 6 (six) hours as needed for moderate pain.    [name] morphine (MS CONTIN) 30 MG 12 hr tablet Take 30 mg by mouth every 12 (twelve) hours.    [name] morphine (MSIR) 15 MG tablet Take 15 mg by mouth every 4 (four) hours as needed for moderate pain or severe pain.    . Multiple Vitamin (MULTIVITAMIN) tablet Take 1 tablet by mouth daily.    [name] PARoxetine (PAXIL) 40 MG tablet Take 40 mg by mouth every morning.    . polyethylene glycol powder (GLYCOLAX/MIRALAX) powder Take 17 g by mouth at bedtime.    . pravastatin (PRAVACHOL) 10 MG tablet Take 1 tablet by mouth daily.    . sodium chloride (OCEAN) 0.65 % SOLN nasal spray Place 1 spray into both nostrils as needed for congestion.    . tadalafil (CIALIS) 20 MG tablet Take 10 mg by mouth  daily as needed for erectile dysfunction.    . tamsulosin (FLOMAX) 0.4 MG CAPS capsule Take 0.4 mg by mouth daily.     No current facility-administered medications for this visit.    Allergies:   Codeine; Dilaudid; Penicillins; Simvastatin; Ceclor; Oxycontin; and Sulfa antibiotics    Social History:  The patient  reports that he quit smoking about 14 years ago. His smoking use included Cigars. His smokeless tobacco use includes Snuff. He reports that he drinks about 12.6 oz of alcohol per week. He reports that he does not use illicit drugs.   Family History:  The patient's family history includes Arthritis in his mother; Heart disease in his father; Hyperlipidemia in his brother and mother; Leukemia in his mother.    ROS:  Please see the history of present illness.    Review of Systems: Constitutional:  denies fever, chills, diaphoresis, appetite change and fatigue.  HEENT: denies photophobia,  eye pain, redness, hearing loss, ear pain, congestion, sore throat, rhinorrhea, sneezing, neck pain, neck stiffness and tinnitus.  Respiratory: denies SOB, DOE, cough, chest tightness, and wheezing.  Cardiovascular: denies chest pain, palpitations and leg swelling.  Gastrointestinal: denies nausea, vomiting, abdominal pain, diarrhea, constipation, blood in stool.  Genitourinary: denies dysuria, urgency, frequency, hematuria, flank pain and difficulty urinating.  Musculoskeletal: denies  myalgias, back pain, joint swelling, arthralgias and gait problem.   Skin: denies pallor, rash and wound.  Neurological: denies dizziness, seizures, syncope, weakness, light-headedness, numbness and headaches.   Hematological: denies adenopathy, easy bruising, personal or family bleeding history.  Psychiatric/ Behavioral: denies suicidal ideation, mood changes, confusion, nervousness, sleep disturbance and agitation.       All other systems are reviewed and negative.    PHYSICAL EXAM: VS:  BP 124/90 mmHg  Pulse 68  Ht 6\' 2"  (1.88 m)  Wt 253 lb (114.76 kg)  BMI 32.47 kg/m2 , BMI Body mass index is 32.47 kg/(m^2). GEN: Well nourished, well developed, in no acute distress HEENT: normal Neck: no JVD, carotid bruits, or masses Cardiac: RRR; no murmurs, rubs, or gallops,no edema  Respiratory:  clear to auscultation bilaterally, normal work of breathing GI: soft, nontender, nondistended, + BS MS: no deformity or atrophy Skin: warm and dry, no rash Neuro:  Strength and sensation are intact Psych: normal   EKG:  EKG is not ordered today. The ekg ordered 01/08/15 demonstrates sinus tach with occasional PVCs.    Recent Labs: 12/19/2014: ALT 19 01/08/2015: B Natriuretic Peptide 7.0; BUN 19; Creatinine, Ser 1.10; Hemoglobin 10.9*; Platelets 503*; Potassium 3.5; Sodium 138    Lipid Panel    Component Value Date/Time   CHOL 165 08/23/2014 0618   TRIG 131 08/23/2014 0618   HDL 51 08/23/2014 0618    CHOLHDL 3.2 08/23/2014 0618   VLDL 26 08/23/2014 0618   LDLCALC 88 08/23/2014 0618      Wt Readings from Last 3 Encounters:  09/01/15 253 lb (114.76 kg)  08/14/15 254 lb 8 oz (115.44 kg)  02/09/15 247 lb (112.038 kg)      Other studies Reviewed: Additional studies/ records that were reviewed today include: . Review of the above records demonstrates:    ASSESSMENT AND PLAN:  1. Essential hypertension -  Well controlled at this point   2. 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 He has  grade 1 diastolic dysfunction. ( Echo Nov. 2015)  He's basically asymptomatic at this point. He drinks quite a bit about call. I've advised him to cut back  on his alcohol intake. I've also advised him to decrease his salt intake. We'll start him on losartan 25 mg a day. His heart rate is 68 and I do not think that he'll tolerate a beta blocker at this point. File seen in 6 months. We'll get an echocardiogram prior to that visit. Will get a BMP in 3 weeks .   3. COPD 4. History of sleep apnea 5. Anxiety/depression 6. Hyperlipidemia 7.  TIA - on ASA 325 for his TIA   Current medicines are reviewed at length with the patient today.  The patient does not have concerns regarding medicines.  The following changes have been made:  Add Losartan 25 a day   Labs/ tests ordered today include:   No orders of the defined types were placed in this encounter.     Disposition:   FU with me in 6 months      Braelynn Benning, Wonda Cheng, MD  09/01/2015 9:02 AM    [facility] [place], [place], [place]  [address] Phone: [phone]; Fax: [phone]   [facility]  [address] [place],   [address] [phone]   Fax [phone]

## 2015-09-18 ENCOUNTER — Other Ambulatory Visit (INDEPENDENT_AMBULATORY_CARE_PROVIDER_SITE_OTHER): Payer: BLUE CROSS/BLUE SHIELD | Admitting: *Deleted

## 2015-09-18 DIAGNOSIS — I5042 Chronic combined systolic (congestive) and diastolic (congestive) heart failure: Secondary | ICD-10-CM | POA: Diagnosis not present

## 2015-09-18 LAB — BASIC METABOLIC PANEL
BUN: 19 mg/dL (ref 7–25)
CO2: 28 mmol/L (ref 20–31)
Calcium: 9.2 mg/dL (ref 8.6–10.3)
Chloride: 100 mmol/L (ref 98–110)
Creat: 1.27 mg/dL — ABNORMAL HIGH (ref 0.70–1.25)
Glucose, Bld: 84 mg/dL (ref 65–99)
Potassium: 4.2 mmol/L (ref 3.5–5.3)
Sodium: 135 mmol/L (ref 135–146)

## 2015-11-20 ENCOUNTER — Other Ambulatory Visit: Payer: Self-pay

## 2015-11-20 MED ORDER — LOSARTAN POTASSIUM 25 MG PO TABS: 25.0000 mg | ORAL_TABLET | Freq: Every day | ORAL | Status: DC

## 2016-02-12 ENCOUNTER — Ambulatory Visit: Payer: BLUE CROSS/BLUE SHIELD | Admitting: Adult Health

## 2016-03-29 ENCOUNTER — Other Ambulatory Visit: Payer: Self-pay

## 2016-03-29 ENCOUNTER — Ambulatory Visit (HOSPITAL_COMMUNITY): Payer: BLUE CROSS/BLUE SHIELD | Attending: Cardiology

## 2016-03-29 DIAGNOSIS — I5189 Other ill-defined heart diseases: Secondary | ICD-10-CM | POA: Insufficient documentation

## 2016-03-29 DIAGNOSIS — J449 Chronic obstructive pulmonary disease, unspecified: Secondary | ICD-10-CM | POA: Diagnosis not present

## 2016-03-29 DIAGNOSIS — I5042 Chronic combined systolic (congestive) and diastolic (congestive) heart failure: Secondary | ICD-10-CM | POA: Diagnosis not present

## 2016-03-29 DIAGNOSIS — I77810 Thoracic aortic ectasia: Secondary | ICD-10-CM | POA: Diagnosis not present

## 2016-03-29 DIAGNOSIS — Z720 Tobacco use: Secondary | ICD-10-CM | POA: Diagnosis not present

## 2016-03-29 LAB — ECHOCARDIOGRAM COMPLETE
Ao-asc: 35 cm
E decel time: 489 msec
E/e' ratio: 4.41
FS: 28 % (ref 28–44)
IVS/LV PW RATIO, ED: 0.93
LA ID, A-P, ES: 39 mm
LA diam end sys: 39 mm
LA diam index: 1.57 cm/m2
LA vol A4C: 74 ml
LA vol index: 30.7 mL/m2
LA vol: 76 mL
LV E/e' medial: 4.41
LV E/e'average: 4.41
LV PW d: 10.7 mm — AB (ref 0.6–1.1)
LV dias vol index: 46 mL/m2
LV dias vol: 113 mL (ref 62–150)
LV e' LATERAL: 10.4 cm/s
LV sys vol index: 21 mL/m2
LV sys vol: 51 mL (ref 21–61)
LVOT SV: 79 mL
LVOT VTI: 17.5 cm
LVOT area: 4.52 cm2
LVOT diameter: 24 mm
LVOT peak grad rest: 3 mmHg
LVOT peak vel: 85 cm/s
Lateral S' vel: 11 cm/s
MV Dec: 489
MV pk A vel: 70.6 m/s
MV pk E vel: 45.9 m/s
RV sys press: 25 mmHg
Reg peak vel: 234 cm/s
S' Lateral: 11 cm/s
Simpson's disk: 55
Stroke v: 62 ml
TDI e' lateral: 10.4
TDI e' medial: 6.53
TR max vel: 234 cm/s

## 2016-03-30 DIAGNOSIS — I2510 Atherosclerotic heart disease of native coronary artery without angina pectoris: Secondary | ICD-10-CM

## 2016-03-30 HISTORY — DX: Atherosclerotic heart disease of native coronary artery without angina pectoris: I25.10

## 2016-04-17 ENCOUNTER — Encounter (INDEPENDENT_AMBULATORY_CARE_PROVIDER_SITE_OTHER): Payer: Self-pay

## 2016-04-17 ENCOUNTER — Ambulatory Visit (INDEPENDENT_AMBULATORY_CARE_PROVIDER_SITE_OTHER): Payer: BLUE CROSS/BLUE SHIELD | Admitting: Cardiovascular Disease

## 2016-04-17 ENCOUNTER — Encounter: Payer: Self-pay | Admitting: Cardiovascular Disease

## 2016-04-17 VITALS — BP 100/80 | HR 83 | Ht 74.0 in | Wt 267.4 lb

## 2016-04-17 DIAGNOSIS — I5022 Chronic systolic (congestive) heart failure: Secondary | ICD-10-CM

## 2016-04-17 DIAGNOSIS — I5042 Chronic combined systolic (congestive) and diastolic (congestive) heart failure: Secondary | ICD-10-CM | POA: Diagnosis not present

## 2016-04-17 MED ORDER — CARVEDILOL 3.125 MG PO TABS
3.1250 mg | ORAL_TABLET | Freq: Two times a day (BID) | ORAL | Status: DC
Start: 2016-04-17 — End: 2017-04-13

## 2016-04-17 NOTE — Progress Notes (Signed)
Cardiology Office Note   Date:  04/17/2016   ID:  KEAGIN ARNTZEN, DOB 04/30/1946, MRN [id number]  PCP:  Gennette Pac, MD  Cardiologist:   Mertie Moores, MD   No chief complaint on file.  Problem list 1. Essential hypertension 2. 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 He has  grade 1 diastolic dysfunction. ( Echo Nov. 2015)  Will get a lexiscan myoview for further eval .  Will add Coreg 3.125 mg BID  Office visit in 3 months   3. COPD  4. Anxiety/depression 5. Hyperlipidemia - he has tried numerous statins but does not tolerate any of them. He was last tried on Pravachol but again, this caused lots of muscle aches. Dr. Rex Kras has been managing his lipids. He may want to try him on Zetia  10 mg a day.  6.  TIA - on ASA 325 for his TIA     DAO STRITE is a 70 y.o. male who presents for evaluation of CHF Has not had and dyspnea.   Walks 3-5 a day , Monday - Friday . No PND or orthopnea. No CP , no syncope.   Still eats some salt  - eats soup once a week, hot dogs once a month. Eats deli meats that have some salt   Non-smoker Drinks lots of beer -  4-5 a day.  More on the weekend.   April 17, 2016:;     Doing well. Walks every day - several miles No CP or dyspnea.  Some fatigue climbing a hill  Past Medical History  Diagnosis Date  . DDD (degenerative disc disease), lumbar L4  . Chronic back pain   . Arthritis KNEES  . Acute medial meniscus tear of left knee   . Depression   . Stroke [facility])     ? TIA   . GERD (gastroesophageal reflux disease)   . Ringing in ears   . Insomnia   . Memory loss   . Depression   . TIA (transient ischemic attack) 2015    Past Surgical History  Procedure Laterality Date  . Spinal cord stimulator implant  2009  . Right knee surg.  2005  . Knee arthroscopy  01/15/2012    Procedure: ARTHROSCOPY KNEE;  Surgeon: Gearlean Alf, MD;  Location: [facility];   Service: Orthopedics;  Laterality: Left;  meniscal debridement  . Tonsilectomy/adenoidectomy with myringotomy    . Vasectomy    . Hc eeg adult  08/23/2014       . Total knee arthroplasty Right 12/26/2014    Procedure: TOTAL RIGHT  KNEE ARTHROPLASTY;  Surgeon: Gaynelle Arabian, MD;  Location: [facility];  Service: Orthopedics;  Laterality: Right;     Current Outpatient Prescriptions  Medication Sig Dispense Refill  . ALPRAZolam (XANAX) 0.5 MG tablet Take 0.5 mg by mouth 2 (two) times daily as needed for anxiety.     [name] aspirin 325 MG tablet Take 325 mg by mouth daily.    [name] ibuprofen (ADVIL,MOTRIN) 200 MG tablet Take 200 mg by mouth every 6 (six) hours as needed for moderate pain.    [name] losartan (COZAAR) 25 MG tablet Take 1 tablet (25 mg total) by mouth daily. 90 tablet 3  . morphine (MS CONTIN) 30 MG 12 hr tablet Take 30 mg by mouth every 12 (twelve) hours.    [name] morphine (MSIR) 15 MG tablet Take 15 mg by mouth every 4 (four) hours as needed for moderate pain  or severe pain.    . Multiple Vitamin (MULTIVITAMIN) tablet Take 1 tablet by mouth daily.    [name] PARoxetine (PAXIL) 40 MG tablet Take 40 mg by mouth every morning.    . polyethylene glycol powder (GLYCOLAX/MIRALAX) powder Take 17 g by mouth at bedtime.    . pravastatin (PRAVACHOL) 10 MG tablet Take 1 tablet by mouth daily.    . sodium chloride (OCEAN) 0.65 % SOLN nasal spray Place 1 spray into both nostrils as needed for congestion.    . tadalafil (CIALIS) 20 MG tablet Take 10 mg by mouth daily as needed for erectile dysfunction.    . tamsulosin (FLOMAX) 0.4 MG CAPS capsule Take 0.4 mg by mouth daily.     No current facility-administered medications for this visit.    Allergies:   Codeine; Dilaudid; Penicillins; Simvastatin; Ceclor; Oxycontin; and Sulfa antibiotics    Social History:  The patient  reports that he quit smoking about 15 years ago. His smoking use included Cigars. His smokeless tobacco use includes Snuff. He reports that he  drinks about 12.6 oz of alcohol per week. He reports that he does not use illicit drugs.   Family History:  The patient's family history includes Arthritis in his mother; Heart disease in his father; Hyperlipidemia in his brother and mother; Leukemia in his mother.    ROS:  Please see the history of present illness.    Review of Systems: Constitutional:  denies fever, chills, diaphoresis, appetite change and fatigue.  HEENT: denies photophobia, eye pain, redness, hearing loss, ear pain, congestion, sore throat, rhinorrhea, sneezing, neck pain, neck stiffness and tinnitus.  Respiratory: denies SOB, DOE, cough, chest tightness, and wheezing.  Cardiovascular: denies chest pain, palpitations and leg swelling.  Gastrointestinal: denies nausea, vomiting, abdominal pain, diarrhea, constipation, blood in stool.  Genitourinary: denies dysuria, urgency, frequency, hematuria, flank pain and difficulty urinating.  Musculoskeletal: denies  myalgias, back pain, joint swelling, arthralgias and gait problem.   Skin: denies pallor, rash and wound.  Neurological: denies dizziness, seizures, syncope, weakness, light-headedness, numbness and headaches.   Hematological: denies adenopathy, easy bruising, personal or family bleeding history.  Psychiatric/ Behavioral: denies suicidal ideation, mood changes, confusion, nervousness, sleep disturbance and agitation.       All other systems are reviewed and negative.    PHYSICAL EXAM: VS:  BP 100/80 mmHg  Pulse 83  Ht 6\' 2"  (1.88 m)  Wt 267 lb 6.4 oz (121.292 kg)  BMI 34.32 kg/m2 , BMI Body mass index is 34.32 kg/(m^2). GEN: Well nourished, well developed, in no acute distress HEENT: normal Neck: no JVD, carotid bruits, or masses Cardiac: RRR; no murmurs, rubs, or gallops,no edema  Respiratory:  clear to auscultation bilaterally, normal work of breathing GI: soft, nontender, nondistended, + BS MS: no deformity or atrophy Skin: warm and dry, no  rash Neuro:  Strength and sensation are intact Psych: normal   EKG:  EKG is ordered today. The ekg ordered shows NSR at 83.  LAD , otherwise normal    Recent Labs: 09/18/2015: BUN 19; Creat 1.27*; Potassium 4.2; Sodium 135    Lipid Panel    Component Value Date/Time   CHOL 165 08/23/2014 0618   TRIG 131 08/23/2014 0618   HDL 51 08/23/2014 0618   CHOLHDL 3.2 08/23/2014 0618   VLDL 26 08/23/2014 0618   LDLCALC 88 08/23/2014 0618      Wt Readings from Last 3 Encounters:  04/17/16 267 lb 6.4 oz (121.292 kg)  09/01/15 253 lb (114.76  kg)  08/14/15 254 lb 8 oz (115.44 kg)      Other studies Reviewed: Additional studies/ records that were reviewed today include: . Review of the above records demonstrates:    ASSESSMENT AND PLAN:  1. Essential hypertension 2. 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 He has  grade 1 diastolic dysfunction. ( Echo Nov. 2015)  Will get a lexiscan myoview for further eval .  Will add Coreg 3.125 mg BID  Office visit in 3 months  3. COPD 4. Anxiety/depression 5. Hyperlipidemia - he has tried numerous statins but does not tolerate any of them. He was last tried on Pravachol but again, this caused lots of muscle aches. Dr. Rex Kras has been managing his lipids. He may want to try him on Zetia  10 mg a day. 6.  TIA - on ASA 325 for his TIA  Current medicines are reviewed at length with the patient today.  The patient does not have concerns regarding medicines.  The following changes have been made:  Add Losartan 25 a day   Labs/ tests ordered today include:   No orders of the defined types were placed in this encounter.     Disposition:   FU with me in 3 months      Mertie Moores, MD  04/17/2016 2:28 PM    [facility] [place], [place], [place]  [address] Phone: [phone]; Fax: [phone]   [facility]  [address] [place], [place]  [address] [phone]   Fax [phone]

## 2016-04-17 NOTE — Patient Instructions (Signed)
Medication Instructions:  START Carvedilol (Coreg) 3.125 mg twice daily   Labwork: None Ordered   Testing/Procedures: Your physician has requested that you have a lexiscan myoview. For further information please visit [email or web address]. Please follow instruction sheet, as given.   Follow-Up: Your physician recommends that you schedule a follow-up appointment in: 3 months with Dr. Acie Fredrickson.    If you need a refill on your cardiac medications before your next appointment, please call your pharmacy.   Thank you for choosing [facility]! Christen Bame, RN [phone]

## 2016-04-18 ENCOUNTER — Telehealth (HOSPITAL_COMMUNITY): Payer: Self-pay | Admitting: *Deleted

## 2016-04-18 NOTE — Telephone Encounter (Signed)
Patient given detailed instructions per Myocardial Perfusion Study Information Sheet for the test on 04/23/16. Patient notified to arrive 15 minutes early and that it is imperative to arrive on time for appointment to keep from having the test rescheduled.  If you need to cancel or reschedule your appointment, please call the office within 24 hours of your appointment. Failure to do so may result in a cancellation of your appointment, and a $50 no show fee. Patient verbalized understanding. Loui Massenburg J Ndea Kilroy, RN   

## 2016-04-23 ENCOUNTER — Ambulatory Visit (HOSPITAL_COMMUNITY): Payer: BLUE CROSS/BLUE SHIELD | Attending: Cardiology

## 2016-04-23 DIAGNOSIS — I110 Hypertensive heart disease with heart failure: Secondary | ICD-10-CM | POA: Diagnosis not present

## 2016-04-23 DIAGNOSIS — I5022 Chronic systolic (congestive) heart failure: Secondary | ICD-10-CM | POA: Diagnosis present

## 2016-04-23 DIAGNOSIS — R0609 Other forms of dyspnea: Secondary | ICD-10-CM | POA: Diagnosis not present

## 2016-04-23 DIAGNOSIS — R9439 Abnormal result of other cardiovascular function study: Secondary | ICD-10-CM | POA: Diagnosis not present

## 2016-04-23 LAB — MYOCARDIAL PERFUSION IMAGING
LV dias vol: 153 mL (ref 62–150)
LV sys vol: 82 mL
Peak HR: 87 {beats}/min
RATE: 0.38
Rest HR: 68 {beats}/min
SDS: 5
SRS: 4
SSS: 9
TID: 1.03

## 2016-04-23 MED ORDER — TECHNETIUM TC 99M TETROFOSMIN IV KIT
10.4000 | PACK | Freq: Once | INTRAVENOUS | Status: AC | PRN
  Administered 2016-04-23: 10 via INTRAVENOUS
  Filled 2016-04-23: qty 10

## 2016-04-23 MED ORDER — TECHNETIUM TC 99M TETROFOSMIN IV KIT
32.6000 | PACK | Freq: Once | INTRAVENOUS | Status: AC | PRN
  Administered 2016-04-23: 33 via INTRAVENOUS
  Filled 2016-04-23: qty 33

## 2016-04-23 MED ORDER — REGADENOSON 0.4 MG/5ML IV SOLN
0.4000 mg | Freq: Once | INTRAVENOUS | Status: AC
  Administered 2016-04-23: 0.4 mg via INTRAVENOUS

## 2016-04-24 ENCOUNTER — Encounter: Payer: Self-pay | Admitting: Nurse Practitioner

## 2016-04-24 ENCOUNTER — Telehealth: Payer: Self-pay | Admitting: Nurse Practitioner

## 2016-04-24 DIAGNOSIS — R9430 Abnormal result of cardiovascular function study, unspecified: Secondary | ICD-10-CM

## 2016-04-24 NOTE — Telephone Encounter (Signed)
Notes Recorded by Thayer Headings, MD on 04/24/2016 at 8:12 AM EDT It appears that he has had an Inferior MI in the past He will need a cardiac cath.  Reviewed results of myoview with patient who verbalized understanding and agreement to proceed with cardiac cath.  He is scheduled for Monday 7/31 with Dr. Ellyn Hack.  Lab work is scheduled for Thursday 7/27.  I reviewed pre-procedure instructions with patient including to continue ASA 325 mg for hx TIAs.  I advised patient that he can pick up instructions from me when he comes to the office tomorrow.  He verbalized understanding and agreement with plan.

## 2016-04-25 ENCOUNTER — Other Ambulatory Visit: Payer: Self-pay | Admitting: Cardiovascular Disease

## 2016-04-25 ENCOUNTER — Other Ambulatory Visit: Payer: BLUE CROSS/BLUE SHIELD | Admitting: *Deleted

## 2016-04-25 DIAGNOSIS — R9430 Abnormal result of cardiovascular function study, unspecified: Secondary | ICD-10-CM

## 2016-04-25 DIAGNOSIS — I2510 Atherosclerotic heart disease of native coronary artery without angina pectoris: Secondary | ICD-10-CM

## 2016-04-25 LAB — BASIC METABOLIC PANEL
BUN: 20 mg/dL (ref 7–25)
CO2: 27 mmol/L (ref 20–31)
Calcium: 8.9 mg/dL (ref 8.6–10.3)
Chloride: 101 mmol/L (ref 98–110)
Creat: 1.3 mg/dL — ABNORMAL HIGH (ref 0.70–1.25)
Glucose, Bld: 94 mg/dL (ref 65–99)
Potassium: 4.5 mmol/L (ref 3.5–5.3)
Sodium: 136 mmol/L (ref 135–146)

## 2016-04-25 LAB — CBC
HCT: 40.8 % (ref 38.5–50.0)
Hemoglobin: 13.7 g/dL (ref 13.2–17.1)
MCH: 30.9 pg (ref 27.0–33.0)
MCHC: 33.6 g/dL (ref 32.0–36.0)
MCV: 91.9 fL (ref 80.0–100.0)
MPV: 9.7 fL (ref 7.5–12.5)
Platelets: 202 10*3/uL (ref 140–400)
RBC: 4.44 MIL/uL (ref 4.20–5.80)
RDW: 13.9 % (ref 11.0–15.0)
WBC: 7.8 10*3/uL (ref 3.8–10.8)

## 2016-04-25 LAB — PROTIME-INR
INR: 1.1
Prothrombin Time: 11.3 s (ref 9.0–11.5)

## 2016-04-29 ENCOUNTER — Encounter (HOSPITAL_COMMUNITY): Payer: Self-pay | Admitting: Cardiology

## 2016-04-29 ENCOUNTER — Ambulatory Visit (HOSPITAL_COMMUNITY)
Admission: RE | Admit: 2016-04-29 | Discharge: 2016-04-29 | Disposition: A | Discharge status: Home / Self Care | Payer: BLUE CROSS/BLUE SHIELD | Source: Ambulatory Visit | Attending: Cardiology | Admitting: Cardiology

## 2016-04-29 ENCOUNTER — Encounter (HOSPITAL_COMMUNITY)
Admission: RE | Disposition: A | Discharge status: Home / Self Care | Payer: Self-pay | Source: Ambulatory Visit | Attending: Cardiology

## 2016-04-29 DIAGNOSIS — Z880 Allergy status to penicillin: Secondary | ICD-10-CM | POA: Diagnosis not present

## 2016-04-29 DIAGNOSIS — G4700 Insomnia, unspecified: Secondary | ICD-10-CM | POA: Diagnosis not present

## 2016-04-29 DIAGNOSIS — Z8249 Family history of ischemic heart disease and other diseases of the circulatory system: Secondary | ICD-10-CM | POA: Diagnosis not present

## 2016-04-29 DIAGNOSIS — F329 Major depressive disorder, single episode, unspecified: Secondary | ICD-10-CM | POA: Insufficient documentation

## 2016-04-29 DIAGNOSIS — Z720 Tobacco use: Secondary | ICD-10-CM | POA: Insufficient documentation

## 2016-04-29 DIAGNOSIS — E785 Hyperlipidemia, unspecified: Secondary | ICD-10-CM | POA: Diagnosis not present

## 2016-04-29 DIAGNOSIS — I110 Hypertensive heart disease with heart failure: Secondary | ICD-10-CM | POA: Insufficient documentation

## 2016-04-29 DIAGNOSIS — I5042 Chronic combined systolic (congestive) and diastolic (congestive) heart failure: Secondary | ICD-10-CM | POA: Diagnosis not present

## 2016-04-29 DIAGNOSIS — Z7982 Long term (current) use of aspirin: Secondary | ICD-10-CM | POA: Diagnosis not present

## 2016-04-29 DIAGNOSIS — I5022 Chronic systolic (congestive) heart failure: Secondary | ICD-10-CM

## 2016-04-29 DIAGNOSIS — Z96651 Presence of right artificial knee joint: Secondary | ICD-10-CM | POA: Insufficient documentation

## 2016-04-29 DIAGNOSIS — I2510 Atherosclerotic heart disease of native coronary artery without angina pectoris: Secondary | ICD-10-CM | POA: Diagnosis not present

## 2016-04-29 DIAGNOSIS — I2584 Coronary atherosclerosis due to calcified coronary lesion: Secondary | ICD-10-CM | POA: Diagnosis not present

## 2016-04-29 DIAGNOSIS — Z8673 Personal history of transient ischemic attack (TIA), and cerebral infarction without residual deficits: Secondary | ICD-10-CM | POA: Diagnosis not present

## 2016-04-29 DIAGNOSIS — J449 Chronic obstructive pulmonary disease, unspecified: Secondary | ICD-10-CM | POA: Insufficient documentation

## 2016-04-29 DIAGNOSIS — R931 Abnormal findings on diagnostic imaging of heart and coronary circulation: Secondary | ICD-10-CM

## 2016-04-29 DIAGNOSIS — K219 Gastro-esophageal reflux disease without esophagitis: Secondary | ICD-10-CM | POA: Insufficient documentation

## 2016-04-29 DIAGNOSIS — R413 Other amnesia: Secondary | ICD-10-CM | POA: Diagnosis not present

## 2016-04-29 DIAGNOSIS — Z882 Allergy status to sulfonamides status: Secondary | ICD-10-CM | POA: Diagnosis not present

## 2016-04-29 DIAGNOSIS — I429 Cardiomyopathy, unspecified: Secondary | ICD-10-CM | POA: Diagnosis not present

## 2016-04-29 DIAGNOSIS — R9439 Abnormal result of other cardiovascular function study: Secondary | ICD-10-CM | POA: Diagnosis present

## 2016-04-29 DIAGNOSIS — M1990 Unspecified osteoarthritis, unspecified site: Secondary | ICD-10-CM | POA: Insufficient documentation

## 2016-04-29 DIAGNOSIS — M5136 Other intervertebral disc degeneration, lumbar region: Secondary | ICD-10-CM | POA: Insufficient documentation

## 2016-04-29 DIAGNOSIS — G8929 Other chronic pain: Secondary | ICD-10-CM | POA: Insufficient documentation

## 2016-04-29 DIAGNOSIS — F419 Anxiety disorder, unspecified: Secondary | ICD-10-CM | POA: Insufficient documentation

## 2016-04-29 HISTORY — PX: CARDIAC CATHETERIZATION: SHX172

## 2016-04-29 SURGERY — LEFT HEART CATH AND CORONARY ANGIOGRAPHY
Anesthesia: LOCAL

## 2016-04-29 MED ORDER — VERAPAMIL HCL 2.5 MG/ML IV SOLN
INTRAVENOUS | Status: AC
  Filled 2016-04-29: qty 2

## 2016-04-29 MED ORDER — HEPARIN (PORCINE) IN NACL 2-0.9 UNIT/ML-% IJ SOLN
INTRAMUSCULAR | Status: AC
  Filled 2016-04-29: qty 1000

## 2016-04-29 MED ORDER — SODIUM CHLORIDE 0.9% FLUSH: 3.0000 mL | Freq: Two times a day (BID) | INTRAVENOUS | Status: DC

## 2016-04-29 MED ORDER — HEPARIN SODIUM (PORCINE) 1000 UNIT/ML IJ SOLN
INTRAMUSCULAR | Status: AC
  Filled 2016-04-29: qty 1

## 2016-04-29 MED ORDER — MIDAZOLAM HCL 2 MG/2ML IJ SOLN
INTRAMUSCULAR | Status: AC
  Filled 2016-04-29: qty 2

## 2016-04-29 MED ORDER — SODIUM CHLORIDE 0.9 % IV SOLN: 250.0000 mL | INTRAVENOUS | Status: DC | PRN

## 2016-04-29 MED ORDER — IOPAMIDOL (ISOVUE-370) INJECTION 76%
INTRAVENOUS | Status: DC | PRN
  Administered 2016-04-29: 70 mL via INTRA_ARTERIAL

## 2016-04-29 MED ORDER — HEPARIN (PORCINE) IN NACL 2-0.9 UNIT/ML-% IJ SOLN
INTRAMUSCULAR | Status: DC | PRN
  Administered 2016-04-29: 1500 mL

## 2016-04-29 MED ORDER — HEPARIN SODIUM (PORCINE) 1000 UNIT/ML IJ SOLN
INTRAMUSCULAR | Status: DC | PRN
  Administered 2016-04-29: 6000 [IU] via INTRAVENOUS

## 2016-04-29 MED ORDER — MIDAZOLAM HCL 2 MG/2ML IJ SOLN
INTRAMUSCULAR | Status: DC | PRN
  Administered 2016-04-29: 1 mg via INTRAVENOUS

## 2016-04-29 MED ORDER — SODIUM CHLORIDE 0.9% FLUSH: 3.0000 mL | INTRAVENOUS | Status: DC | PRN

## 2016-04-29 MED ORDER — ONDANSETRON HCL 4 MG/2ML IJ SOLN: 4.0000 mg | Freq: Four times a day (QID) | INTRAMUSCULAR | Status: DC | PRN

## 2016-04-29 MED ORDER — FENTANYL CITRATE (PF) 100 MCG/2ML IJ SOLN
INTRAMUSCULAR | Status: DC | PRN
  Administered 2016-04-29: 50 ug via INTRAVENOUS

## 2016-04-29 MED ORDER — ASPIRIN 81 MG PO CHEW: 81.0000 mg | CHEWABLE_TABLET | ORAL | Status: DC

## 2016-04-29 MED ORDER — LIDOCAINE HCL (PF) 1 % IJ SOLN
INTRAMUSCULAR | Status: DC | PRN
  Administered 2016-04-29: 2 mL

## 2016-04-29 MED ORDER — SODIUM CHLORIDE 0.9 % WEIGHT BASED INFUSION
3.0000 mL/kg/h | INTRAVENOUS | Status: DC
  Administered 2016-04-29: 3 mL/kg/h via INTRAVENOUS

## 2016-04-29 MED ORDER — VERAPAMIL HCL 2.5 MG/ML IV SOLN
INTRAVENOUS | Status: DC | PRN
  Administered 2016-04-29: 10 mL via INTRA_ARTERIAL

## 2016-04-29 MED ORDER — SODIUM CHLORIDE 0.9 % WEIGHT BASED INFUSION: 1.0000 mL/kg/h | INTRAVENOUS | Status: DC

## 2016-04-29 MED ORDER — ACETAMINOPHEN 325 MG PO TABS: 650.0000 mg | ORAL_TABLET | ORAL | Status: DC | PRN

## 2016-04-29 MED ORDER — FENTANYL CITRATE (PF) 100 MCG/2ML IJ SOLN
INTRAMUSCULAR | Status: AC
  Filled 2016-04-29: qty 2

## 2016-04-29 MED ORDER — NITROGLYCERIN 1 MG/10 ML FOR IR/CATH LAB
INTRA_ARTERIAL | Status: AC
  Filled 2016-04-29: qty 10

## 2016-04-29 MED ORDER — NITROGLYCERIN 1 MG/10 ML FOR IR/CATH LAB
INTRA_ARTERIAL | Status: DC | PRN
  Administered 2016-04-29: 200 ug via INTRACORONARY

## 2016-04-29 MED ORDER — IOPAMIDOL (ISOVUE-370) INJECTION 76%
INTRAVENOUS | Status: AC
  Filled 2016-04-29: qty 100

## 2016-04-29 MED ORDER — LIDOCAINE HCL (PF) 1 % IJ SOLN
INTRAMUSCULAR | Status: AC
  Filled 2016-04-29: qty 30

## 2016-04-29 SURGICAL SUPPLY — 10 items
CATH INFINITI 5FR ANG PIGTAIL (CATHETERS) ×1 IMPLANT
CATH OPTITORQUE TIG 4.0 5F (CATHETERS) ×1 IMPLANT
DEVICE RAD COMP TR BAND LRG (VASCULAR PRODUCTS) ×1 IMPLANT
GLIDESHEATH SLEND A-KIT 6F 22G (SHEATH) ×1 IMPLANT
KIT HEART LEFT (KITS) ×2 IMPLANT
PACK CARDIAC CATHETERIZATION (CUSTOM PROCEDURE TRAY) ×2 IMPLANT
SYR MEDRAD MARK V 150ML (SYRINGE) ×2 IMPLANT
TRANSDUCER W/STOPCOCK (MISCELLANEOUS) ×2 IMPLANT
TUBING CIL FLEX 10 FLL-RA (TUBING) ×2 IMPLANT
WIRE SAFE-T 1.5MM-J .035X260CM (WIRE) ×1 IMPLANT

## 2016-04-29 NOTE — Interval H&P Note (Signed)
History and Physical Interval Note:  04/29/2016 8:51 AM  Joseph Foster  has presented today for surgery, with the diagnosis of abnormal myoview.  The various methods of treatment have been discussed with the patient and family. After consideration of risks, benefits and other options for treatment, the patient has consented to  Procedure(s): Left Heart Cath and Coronary Angiography (N/A) with possible Percutaneous Coronary Intervention as a surgical intervention .  The patient's history has been reviewed, patient examined, no change in status, stable for surgery.  I have reviewed the patient's chart and labs.  Questions were answered to the patient's satisfaction.    Cath Lab Visit (complete for each Cath Lab visit)  Clinical Evaluation Leading to the Procedure:   ACS: No.  Non-ACS:    Anginal Classification: CCS I  Anti-ischemic medical therapy: No Therapy  Non-Invasive Test Results: Intermediate-risk stress test findings: cardiac mortality 1-3%/year  Prior CABG: No previous CABG   Ischemic Symptoms? CCS I (Ordinary physical activity does not cause anginal symptoms) Anti-ischemic Medical Therapy? Minimal Therapy (1 class of medications) Non-invasive Test Results? Intermediate-risk stress test findings: cardiac mortality 1-3%/year Prior CABG? No Previous CABG   Patient Information:   1-2V CAD, no prox LAD  U (5)  Indication: 16; Score: 5   Patient Information:   CTO of 1 vessel, no other CAD  U (4)  Indication: 26; Score: 4   Patient Information:   1V CAD with prox LAD  U (6)  Indication: 32; Score: 6   Patient Information:   2V-CAD with prox LAD  A (7)  Indication: 38; Score: 7   Patient Information:   3V-CAD without LMCA  A (7)  Indication: 44; Score: 7   Patient Information:   3V-CAD without LMCA With Abnormal LV systolic function  A (9)  Indication: 48; Score: 9   Patient Information:   LMCA-CAD  A (9)  Indication: 49; Score:  9   Patient Information:   2V-CAD with prox LAD PCI  A (7)  Indication: 62; Score: 7   Patient Information:   2V-CAD with prox LAD CABG  A (8)  Indication: 62; Score: 8   Patient Information:   3V-CAD without LMCA With Low CAD burden(i.e., 3 focal stenoses, low SYNTAX score) PCI  A (7)  Indication: 63; Score: 7   Patient Information:   3V-CAD without LMCA With Low CAD burden(i.e., 3 focal stenoses, low SYNTAX score) CABG  A (9)  Indication: 63; Score: 9   Patient Information:   3V-CAD without LMCA E06c - Intermediate-high CAD burden (i.e., multiple diffuse lesions, presence of CTO, or high SYNTAX score) PCI  U (4)  Indication: 64; Score: 4   Patient Information:   3V-CAD without LMCA E06c - Intermediate-high CAD burden (i.e., multiple diffuse lesions, presence of CTO, or high SYNTAX score) CABG  A (9)  Indication: 64; Score: 9   Patient Information:   LMCA-CAD With Isolated LMCA stenosis  PCI  U (6)  Indication: 65; Score: 6   Patient Information:   LMCA-CAD With Isolated LMCA stenosis  CABG  A (9)  Indication: 65; Score: 9   Patient Information:   LMCA-CAD Additional CAD, low CAD burden (i.e., 1- to 2-vessel additional involvement, low SYNTAX score) PCI  U (5)  Indication: 66; Score: 5   Patient Information:   LMCA-CAD Additional CAD, low CAD burden (i.e., 1- to 2-vessel additional involvement, low SYNTAX score) CABG  A (9)  Indication: 66; Score: 9   Patient Information:  LMCA-CAD Additional CAD, intermediate-high CAD burden (i.e., 3-vessel involvement, presence of CTO, or high SYNTAX score) PCI  I (3)  Indication: 67; Score: 3   Patient Information:   LMCA-CAD Additional CAD, intermediate-high CAD burden (i.e., 3-vessel involvement, presence of CTO, or high SYNTAX score) CABG  A (9)  Indication: 67; Score: 9    Glenetta Hew

## 2016-04-29 NOTE — Discharge Instructions (Signed)
Radial Site Care °Refer to this sheet in the next few weeks. These instructions provide you with information about caring for yourself after your procedure. Your health care provider may also give you more specific instructions. Your treatment has been planned according to current medical practices, but problems sometimes occur. Call your health care provider if you have any problems or questions after your procedure. °WHAT TO EXPECT AFTER THE PROCEDURE °After your procedure, it is typical to have the following: °· Bruising at the radial site that usually fades within 1-2 weeks. °· Blood collecting in the tissue (hematoma) that may be painful to the touch. It should usually decrease in size and tenderness within 1-2 weeks. °HOME CARE INSTRUCTIONS °· Take medicines only as directed by your health care provider. °· You may shower 24-48 hours after the procedure or as directed by your health care provider. Remove the bandage (dressing) and gently wash the site with plain soap and water. Pat the area dry with a clean towel. Do not rub the site, because this may cause bleeding. °· Do not take baths, swim, or use a hot tub until your health care provider approves. °· Check your insertion site every day for redness, swelling, or drainage. °· Do not apply powder or lotion to the site. °· Do not flex or bend the affected arm for 24 hours or as directed by your health care provider. °· Do not push or pull heavy objects with the affected arm for 24 hours or as directed by your health care provider. °· Do not lift over 10 lb (4.5 kg) for 5 days after your procedure or as directed by your health care provider. °· Ask your health care provider when it is okay to: °¨ Return to work or school. °¨ Resume usual physical activities or sports. °¨ Resume sexual activity. °· Do not drive home if you are discharged the same day as the procedure. Have someone else drive you. °· You may drive 24 hours after the procedure unless otherwise  instructed by your health care provider. °· Do not operate machinery or power tools for 24 hours after the procedure. °· If your procedure was done as an outpatient procedure, which means that you went home the same day as your procedure, a responsible adult should be with you for the first 24 hours after you arrive home. °· Keep all follow-up visits as directed by your health care provider. This is important. °SEEK MEDICAL CARE IF: °· You have a fever. °· You have chills. °· You have increased bleeding from the radial site. Hold pressure on the site. CALL 911 °SEEK IMMEDIATE MEDICAL CARE IF: °· You have unusual pain at the radial site. °· You have redness, warmth, or swelling at the radial site. °· You have drainage (other than a small amount of blood on the dressing) from the radial site. °· The radial site is bleeding, and the bleeding does not stop after 30 minutes of holding steady pressure on the site. °· Your arm or hand becomes pale, cool, tingly, or numb. °  °This information is not intended to replace advice given to you by your health care provider. Make sure you discuss any questions you have with your health care provider. °  °Document Released: 10/19/2010 Document Revised: 10/07/2014 Document Reviewed: 04/04/2014 °Elsevier Interactive Patient Education ©2016 [employer] Inc[employer] ° °

## 2016-04-29 NOTE — Interval H&P Note (Signed)
History and Physical Interval Note:  04/29/2016 7:20 AM  Joseph Foster  has presented today for surgery, with the diagnosis of abnormal myoview.   The various methods of treatment have been discussed with the patient and family. After consideration of risks, benefits and other options for treatment, the patient has consented to  Procedure(s): Left Heart Cath and Coronary Angiography (N/A) WITH POSSIBLE PERCUTANEOUS CORONARY INTERVENTION as a surgical intervention .  The patient's history has been reviewed, patient examined, no change in status, stable for surgery.  I have reviewed the patient's chart and labs.  Questions were answered to the patient's satisfaction.     Cath Lab Visit (complete for each Cath Lab visit)  Clinical Evaluation Leading to the Procedure:   ACS: No.  Non-ACS:    Anginal Classification: CCS I  Anti-ischemic medical therapy: No Therapy  Non-Invasive Test Results: Intermediate-risk stress test findings: cardiac mortality 1-3%/year  Prior CABG: No previous CABG   Ischemic Symptoms? CCS I (Ordinary physical activity does not cause anginal symptoms) Anti-ischemic Medical Therapy? No Therapy Non-invasive Test Results? Intermediate-risk stress test findings: cardiac mortality 1-3%/year Prior CABG? No Previous CABG   Patient Information:   1-2V CAD, no prox LAD  U (5)  Indication: 16; Score: 5   Patient Information:   CTO of 1 vessel, no other CAD  U (4)  Indication: 26; Score: 4   Patient Information:   1V CAD with prox LAD  U (6)  Indication: 32; Score: 6   Patient Information:   2V-CAD with prox LAD  A (7)  Indication: 38; Score: 7   Patient Information:   3V-CAD without LMCA  A (7)  Indication: 44; Score: 7   Patient Information:   3V-CAD without LMCA With Abnormal LV systolic function  A (9)  Indication: 48; Score: 9   Patient Information:   LMCA-CAD  A (9)  Indication: 49; Score: 9   Patient Information:    2V-CAD with prox LAD PCI  A (7)  Indication: 62; Score: 7   Patient Information:   2V-CAD with prox LAD CABG  A (8)  Indication: 62; Score: 8   Patient Information:   3V-CAD without LMCA With Low CAD burden(i.e., 3 focal stenoses, low SYNTAX score) PCI  A (7)  Indication: 63; Score: 7   Patient Information:   3V-CAD without LMCA With Low CAD burden(i.e., 3 focal stenoses, low SYNTAX score) CABG  A (9)  Indication: 63; Score: 9   Patient Information:   3V-CAD without LMCA E06c - Intermediate-high CAD burden (i.e., multiple diffuse lesions, presence of CTO, or high SYNTAX score) PCI  U (4)  Indication: 64; Score: 4   Patient Information:   3V-CAD without LMCA E06c - Intermediate-high CAD burden (i.e., multiple diffuse lesions, presence of CTO, or high SYNTAX score) CABG  A (9)  Indication: 64; Score: 9   Patient Information:   LMCA-CAD With Isolated LMCA stenosis  PCI  U (6)  Indication: 65; Score: 6   Patient Information:   LMCA-CAD With Isolated LMCA stenosis  CABG  A (9)  Indication: 65; Score: 9   Patient Information:   LMCA-CAD Additional CAD, low CAD burden (i.e., 1- to 2-vessel additional involvement, low SYNTAX score) PCI  U (5)  Indication: 66; Score: 5   Patient Information:   LMCA-CAD Additional CAD, low CAD burden (i.e., 1- to 2-vessel additional involvement, low SYNTAX score) CABG  A (9)  Indication: 66; Score: 9   Patient Information:   LMCA-CAD  Additional CAD, intermediate-high CAD burden (i.e., 3-vessel involvement, presence of CTO, or high SYNTAX score) PCI  I (3)  Indication: 67; Score: 3   Patient Information:   LMCA-CAD Additional CAD, intermediate-high CAD burden (i.e., 3-vessel involvement, presence of CTO, or high SYNTAX score) CABG  A (9)  Indication: 67; Score: 9       Glenetta Hew

## 2016-04-29 NOTE — H&P (View-Only) (Signed)
Cardiology Office Note   Date:  04/17/2016   ID:  SYLAR FOXX, DOB 09-27-1946, MRN [id number]  PCP:  Gennette Pac, MD  Cardiologist:   Mertie Moores, MD   No chief complaint on file.  Problem list 1. Essential hypertension 2. 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 He has  grade 1 diastolic dysfunction. ( Echo Nov. 2015)  Will get a lexiscan myoview for further eval .  Will add Coreg 3.125 mg BID  Office visit in 3 months   3. COPD  4. Anxiety/depression 5. Hyperlipidemia - he has tried numerous statins but does not tolerate any of them. He was last tried on Pravachol but again, this caused lots of muscle aches. Dr. Rex Kras has been managing his lipids. He may want to try him on Zetia  10 mg a day.  6.  TIA - on ASA 325 for his TIA     LATRAVION SCHMALZRIED is a 70 y.o. male who presents for evaluation of CHF Has not had and dyspnea.   Walks 3-5 a day , Monday - Friday . No PND or orthopnea. No CP , no syncope.   Still eats some salt  - eats soup once a week, hot dogs once a month. Eats deli meats that have some salt   Non-smoker Drinks lots of beer -  4-5 a day.  More on the weekend.   April 17, 2016:;     Doing well. Walks every day - several miles No CP or dyspnea.  Some fatigue climbing a hill  Past Medical History  Diagnosis Date  . DDD (degenerative disc disease), lumbar L4  . Chronic back pain   . Arthritis KNEES  . Acute medial meniscus tear of left knee   . Depression   . Stroke [facility])     ? TIA   . GERD (gastroesophageal reflux disease)   . Ringing in ears   . Insomnia   . Memory loss   . Depression   . TIA (transient ischemic attack) 2015    Past Surgical History  Procedure Laterality Date  . Spinal cord stimulator implant  2009  . Right knee surg.  2005  . Knee arthroscopy  01/15/2012    Procedure: ARTHROSCOPY KNEE;  Surgeon: Gearlean Alf, MD;  Location: [facility];   Service: Orthopedics;  Laterality: Left;  meniscal debridement  . Tonsilectomy/adenoidectomy with myringotomy    . Vasectomy    . Hc eeg adult  08/23/2014       . Total knee arthroplasty Right 12/26/2014    Procedure: TOTAL RIGHT  KNEE ARTHROPLASTY;  Surgeon: Gaynelle Arabian, MD;  Location: [facility];  Service: Orthopedics;  Laterality: Right;     Current Outpatient Prescriptions  Medication Sig Dispense Refill  . ALPRAZolam (XANAX) 0.5 MG tablet Take 0.5 mg by mouth 2 (two) times daily as needed for anxiety.     [name] aspirin 325 MG tablet Take 325 mg by mouth daily.    [name] ibuprofen (ADVIL,MOTRIN) 200 MG tablet Take 200 mg by mouth every 6 (six) hours as needed for moderate pain.    [name] losartan (COZAAR) 25 MG tablet Take 1 tablet (25 mg total) by mouth daily. 90 tablet 3  . morphine (MS CONTIN) 30 MG 12 hr tablet Take 30 mg by mouth every 12 (twelve) hours.    [name] morphine (MSIR) 15 MG tablet Take 15 mg by mouth every 4 (four) hours as needed for moderate pain  or severe pain.    . Multiple Vitamin (MULTIVITAMIN) tablet Take 1 tablet by mouth daily.    [name] PARoxetine (PAXIL) 40 MG tablet Take 40 mg by mouth every morning.    . polyethylene glycol powder (GLYCOLAX/MIRALAX) powder Take 17 g by mouth at bedtime.    . pravastatin (PRAVACHOL) 10 MG tablet Take 1 tablet by mouth daily.    . sodium chloride (OCEAN) 0.65 % SOLN nasal spray Place 1 spray into both nostrils as needed for congestion.    . tadalafil (CIALIS) 20 MG tablet Take 10 mg by mouth daily as needed for erectile dysfunction.    . tamsulosin (FLOMAX) 0.4 MG CAPS capsule Take 0.4 mg by mouth daily.     No current facility-administered medications for this visit.    Allergies:   Codeine; Dilaudid; Penicillins; Simvastatin; Ceclor; Oxycontin; and Sulfa antibiotics    Social History:  The patient  reports that he quit smoking about 15 years ago. His smoking use included Cigars. His smokeless tobacco use includes Snuff. He reports that he  drinks about 12.6 oz of alcohol per week. He reports that he does not use illicit drugs.   Family History:  The patient's family history includes Arthritis in his mother; Heart disease in his father; Hyperlipidemia in his brother and mother; Leukemia in his mother.    ROS:  Please see the history of present illness.    Review of Systems: Constitutional:  denies fever, chills, diaphoresis, appetite change and fatigue.  HEENT: denies photophobia, eye pain, redness, hearing loss, ear pain, congestion, sore throat, rhinorrhea, sneezing, neck pain, neck stiffness and tinnitus.  Respiratory: denies SOB, DOE, cough, chest tightness, and wheezing.  Cardiovascular: denies chest pain, palpitations and leg swelling.  Gastrointestinal: denies nausea, vomiting, abdominal pain, diarrhea, constipation, blood in stool.  Genitourinary: denies dysuria, urgency, frequency, hematuria, flank pain and difficulty urinating.  Musculoskeletal: denies  myalgias, back pain, joint swelling, arthralgias and gait problem.   Skin: denies pallor, rash and wound.  Neurological: denies dizziness, seizures, syncope, weakness, light-headedness, numbness and headaches.   Hematological: denies adenopathy, easy bruising, personal or family bleeding history.  Psychiatric/ Behavioral: denies suicidal ideation, mood changes, confusion, nervousness, sleep disturbance and agitation.       All other systems are reviewed and negative.    PHYSICAL EXAM: VS:  BP 100/80 mmHg  Pulse 83  Ht 6\' 2"  (1.88 m)  Wt 267 lb 6.4 oz (121.292 kg)  BMI 34.32 kg/m2 , BMI Body mass index is 34.32 kg/(m^2). GEN: Well nourished, well developed, in no acute distress HEENT: normal Neck: no JVD, carotid bruits, or masses Cardiac: RRR; no murmurs, rubs, or gallops,no edema  Respiratory:  clear to auscultation bilaterally, normal work of breathing GI: soft, nontender, nondistended, + BS MS: no deformity or atrophy Skin: warm and dry, no  rash Neuro:  Strength and sensation are intact Psych: normal   EKG:  EKG is ordered today. The ekg ordered shows NSR at 83.  LAD , otherwise normal    Recent Labs: 09/18/2015: BUN 19; Creat 1.27*; Potassium 4.2; Sodium 135    Lipid Panel    Component Value Date/Time   CHOL 165 08/23/2014 0618   TRIG 131 08/23/2014 0618   HDL 51 08/23/2014 0618   CHOLHDL 3.2 08/23/2014 0618   VLDL 26 08/23/2014 0618   LDLCALC 88 08/23/2014 0618      Wt Readings from Last 3 Encounters:  04/17/16 267 lb 6.4 oz (121.292 kg)  09/01/15 253 lb (114.76  kg)  08/14/15 254 lb 8 oz (115.44 kg)      Other studies Reviewed: Additional studies/ records that were reviewed today include: . Review of the above records demonstrates:    ASSESSMENT AND PLAN:  1. Essential hypertension 2. 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 He has  grade 1 diastolic dysfunction. ( Echo Nov. 2015)  Will get a lexiscan myoview for further eval .  Will add Coreg 3.125 mg BID  Office visit in 3 months  3. COPD 4. Anxiety/depression 5. Hyperlipidemia - he has tried numerous statins but does not tolerate any of them. He was last tried on Pravachol but again, this caused lots of muscle aches. Dr. Rex Kras has been managing his lipids. He may want to try him on Zetia  10 mg a day. 6.  TIA - on ASA 325 for his TIA  Current medicines are reviewed at length with the patient today.  The patient does not have concerns regarding medicines.  The following changes have been made:  Add Losartan 25 a day   Labs/ tests ordered today include:   No orders of the defined types were placed in this encounter.     Disposition:   FU with me in 3 months      Mertie Moores, MD  04/17/2016 2:28 PM    [facility] [place], [place], [place]  [address] Phone: [phone]; Fax: [phone]   [facility]  [address] [place], [place]  [address] [phone]   Fax [phone]

## 2016-05-31 ENCOUNTER — Encounter: Payer: Self-pay | Admitting: Cardiovascular Disease

## 2016-06-17 ENCOUNTER — Ambulatory Visit: Payer: BLUE CROSS/BLUE SHIELD | Admitting: Cardiovascular Disease

## 2016-06-21 ENCOUNTER — Encounter: Payer: Self-pay | Admitting: Cardiovascular Disease

## 2016-08-14 ENCOUNTER — Encounter (INDEPENDENT_AMBULATORY_CARE_PROVIDER_SITE_OTHER): Payer: Self-pay

## 2016-08-14 ENCOUNTER — Encounter: Payer: Self-pay | Admitting: Cardiovascular Disease

## 2016-08-14 ENCOUNTER — Ambulatory Visit (INDEPENDENT_AMBULATORY_CARE_PROVIDER_SITE_OTHER): Payer: BLUE CROSS/BLUE SHIELD | Admitting: Cardiovascular Disease

## 2016-08-14 VITALS — BP 120/85 | HR 60 | Ht 74.0 in | Wt 257.4 lb

## 2016-08-14 DIAGNOSIS — I5042 Chronic combined systolic (congestive) and diastolic (congestive) heart failure: Secondary | ICD-10-CM | POA: Diagnosis not present

## 2016-08-14 DIAGNOSIS — I10 Essential (primary) hypertension: Secondary | ICD-10-CM | POA: Diagnosis not present

## 2016-08-14 DIAGNOSIS — G451 Carotid artery syndrome (hemispheric): Secondary | ICD-10-CM | POA: Diagnosis not present

## 2016-08-14 NOTE — Progress Notes (Signed)
Cardiology Office Note   Date:  08/14/2016   ID:  Joseph Foster, DOB 08/30/1946, MRN [id number]  PCP:  Gennette Pac, MD  Cardiologist:   Mertie Moores, MD   Chief Complaint  Patient presents with  . Follow-up    NO CP, SOB OR SWELLING. NO OTHER COMPLAINTS.   Problem list 1. Essential hypertension 2. Combined systolic and diastolic congestive heart failure-  3. COPD 4. Anxiety/depression 5. Hyperlipidemia  6.  TIA - on ASA 325 for his TIA     Joseph Foster is a 70 y.o. male who presents for evaluation of CHF Has not had and dyspnea.   Walks 3-5 a day , Monday - Friday . No PND or orthopnea. No CP , no syncope.   Still eats some salt  - eats soup once a week, hot dogs once a month. Eats deli meats that have some salt   Non-smoker Drinks lots of beer -  4-5 a day.  More on the weekend.   April 17, 2016:;     Doing well. Walks every day - several miles No CP or dyspnea.  Some fatigue climbing a hill  Nov. 15, 2017:  Joseph Foster is seen Today for his combined systolic and diastolic congestive heart failure. Heart catheterization on 04/29/2016 reveals mild LAD stenosis. His LV systolic function was normal. He has grade 1 diastolic dysfunction by echo    Past Medical History:  Diagnosis Date  . Acute medial meniscus tear of left knee   . Arthritis KNEES  . Chronic back pain   . DDD (degenerative disc disease), lumbar L4  . Depression   . Depression   . GERD (gastroesophageal reflux disease)   . Insomnia   . Memory loss   . Ringing in ears   . Stroke [facility])    ? TIA   . TIA (transient ischemic attack) 2015    Past Surgical History:  Procedure Laterality Date  . CARDIAC CATHETERIZATION N/A 04/29/2016   Procedure: Left Heart Cath and Coronary Angiography;  Surgeon: Leonie Man, MD;  Location: [facility];  Service: Cardiovascular;  Laterality: N/A;  . HC EEG ADULT  08/23/2014      . KNEE ARTHROSCOPY  01/15/2012   Procedure: ARTHROSCOPY  KNEE;  Surgeon: Gearlean Alf, MD;  Location: [facility];  Service: Orthopedics;  Laterality: Left;  meniscal debridement  . RIGHT KNEE SURG.  2005  . SPINAL CORD STIMULATOR IMPLANT  2009  . TONSILECTOMY/ADENOIDECTOMY WITH MYRINGOTOMY    . TOTAL KNEE ARTHROPLASTY Right 12/26/2014   Procedure: TOTAL RIGHT  KNEE ARTHROPLASTY;  Surgeon: Gaynelle Arabian, MD;  Location: [facility];  Service: Orthopedics;  Laterality: Right;  [name] VASECTOMY       Current Outpatient Prescriptions  Medication Sig Dispense Refill  . ALPRAZolam (XANAX) 0.5 MG tablet Take 0.5 mg by mouth 2 (two) times daily as needed for anxiety.     [name] aspirin 325 MG tablet Take 325 mg by mouth daily.    . carvedilol (COREG) 3.125 MG tablet Take 1 tablet (3.125 mg total) by mouth 2 (two) times daily. 60 tablet 11  . losartan (COZAAR) 25 MG tablet Take 1 tablet (25 mg total) by mouth daily. 90 tablet 3  . morphine (MSIR) 15 MG tablet Take 15 mg by mouth every 4 (four) hours as needed for moderate pain or severe pain.    . Multiple Vitamin (MULTIVITAMIN) tablet Take 1 tablet by mouth daily.    [name]  OMEGA-3 KRILL OIL PO Take 1 capsule by mouth daily.    [name] PARoxetine (PAXIL) 40 MG tablet Take 40 mg by mouth every morning.    . polyethylene glycol powder (GLYCOLAX/MIRALAX) powder Take 17 g by mouth at bedtime.    . Potassium (POTASSIMIN PO) Take by mouth.    . sodium chloride (OCEAN) 0.65 % SOLN nasal spray Place 1 spray into both nostrils as needed for congestion.    . tadalafil (CIALIS) 20 MG tablet Take 10 mg by mouth daily as needed for erectile dysfunction.    . tamsulosin (FLOMAX) 0.4 MG CAPS capsule Take 0.4 mg by mouth daily.     No current facility-administered medications for this visit.     Allergies:   Codeine; Dilaudid [hydromorphone hcl]; Penicillins; Simvastatin; Ceclor [cefaclor]; Oxycontin [oxycodone hcl]; and Sulfa antibiotics    Social History:  The patient  reports that he quit smoking about 15 years ago. His  smoking use included Cigars. His smokeless tobacco use includes Snuff. He reports that he drinks about 12.6 oz of alcohol per week . He reports that he does not use drugs.   Family History:  The patient's family history includes Arthritis in his mother; Heart disease in his father; Hyperlipidemia in his brother and mother; Leukemia in his mother.    ROS:  Please see the history of present illness.    Review of Systems: Constitutional:  denies fever, chills, diaphoresis, appetite change and fatigue.  HEENT: denies photophobia, eye pain, redness, hearing loss, ear pain, congestion, sore throat, rhinorrhea, sneezing, neck pain, neck stiffness and tinnitus.  Respiratory: denies SOB, DOE, cough, chest tightness, and wheezing.  Cardiovascular: denies chest pain, palpitations and leg swelling.  Gastrointestinal: denies nausea, vomiting, abdominal pain, diarrhea, constipation, blood in stool.  Genitourinary: denies dysuria, urgency, frequency, hematuria, flank pain and difficulty urinating.  Musculoskeletal: denies  myalgias, back pain, joint swelling, arthralgias and gait problem.   Skin: denies pallor, rash and wound.  Neurological: denies dizziness, seizures, syncope, weakness, light-headedness, numbness and headaches.   Hematological: denies adenopathy, easy bruising, personal or family bleeding history.  Psychiatric/ Behavioral: denies suicidal ideation, mood changes, confusion, nervousness, sleep disturbance and agitation.       All other systems are reviewed and negative.    PHYSICAL EXAM: VS:  BP 120/85 (BP Location: Right Arm, Cuff Size: Large)   Pulse 60   Ht 6\' 2"  (1.88 m)   Wt 257 lb 6.4 oz (116.8 kg)   BMI 33.05 kg/m  , BMI Body mass index is 33.05 kg/m. GEN: Well nourished, well developed, in no acute distress  HEENT: normal  Neck: no JVD, carotid bruits, or masses Cardiac: RRR; no murmurs, rubs, or gallops,no edema  Respiratory:  clear to auscultation bilaterally, normal  work of breathing GI: soft, nontender, nondistended, + BS MS: no deformity or atrophy  Skin: warm and dry, no rash Neuro:  Strength and sensation are intact Psych: normal   EKG:  EKG is ordered today. The ekg ordered shows NSR at  60.  LAD   Recent Labs: 04/25/2016: BUN 20; Creat 1.30; Hemoglobin 13.7; Platelets 202; Potassium 4.5; Sodium 136    Lipid Panel    Component Value Date/Time   CHOL 165 08/23/2014 0618   TRIG 131 08/23/2014 0618   HDL 51 08/23/2014 0618   CHOLHDL 3.2 08/23/2014 0618   VLDL 26 08/23/2014 0618   LDLCALC 88 08/23/2014 0618      Wt Readings from Last 3 Encounters:  08/14/16 257 lb  6.4 oz (116.8 kg)  04/29/16 260 lb (117.9 kg)  04/23/16 267 lb (121.1 kg)      Other studies Reviewed: Additional studies/ records that were reviewed today include: . Review of the above records demonstrates:    ASSESSMENT AND PLAN:  1. Essential hypertension 2. Combined systolic and diastolic congestive heart failure- Has normal cors by cath LV systolic function has now improved.    Continue  with current meds.   3. COPD 4. Anxiety/depression 5. Hyperlipidemia - he has tried numerous statins but does not tolerate any of them. He was last tried on Pravachol but again, this caused lots of muscle aches. Dr. Rex Kras has been managing his lipids. He may want to try him on Zetia  10 mg a day. 6.  TIA - on ASA 325 for his TIA    Disposition:   FU with me in 6  months      Mertie Moores, MD  08/14/2016 9:56 AM    [facility] [place], [place], [place]  [address] Phone: [phone]; Fax: [phone]   [facility]  [address] [place], [place]  [address] [phone]   Fax [phone]

## 2016-08-14 NOTE — Patient Instructions (Signed)
Medication Instructions:  Your physician recommends that you continue on your current medications as directed. Please refer to the Current Medication list given to you today.   Labwork: None Ordered   Testing/Procedures: None Ordered   Follow-Up: Your physician wants you to follow-up in: 6 months with Dr. Nahser.  You will receive a reminder letter in the mail two months in advance. If you don't receive a letter, please call our office to schedule the follow-up appointment.   If you need a refill on your cardiac medications before your next appointment, please call your pharmacy.   Thank you for choosing [facility]! Jeroline Wolbert, RN [phone]    

## 2016-11-04 DIAGNOSIS — M5136 Other intervertebral disc degeneration, lumbar region: Secondary | ICD-10-CM | POA: Insufficient documentation

## 2016-11-04 DIAGNOSIS — M47816 Spondylosis without myelopathy or radiculopathy, lumbar region: Secondary | ICD-10-CM | POA: Insufficient documentation

## 2016-11-15 ENCOUNTER — Other Ambulatory Visit: Payer: Self-pay | Admitting: Cardiovascular Disease

## 2016-12-05 DIAGNOSIS — M5416 Radiculopathy, lumbar region: Secondary | ICD-10-CM | POA: Insufficient documentation

## 2017-04-13 ENCOUNTER — Other Ambulatory Visit: Payer: Self-pay | Admitting: Cardiovascular Disease

## 2017-05-19 ENCOUNTER — Encounter (INDEPENDENT_AMBULATORY_CARE_PROVIDER_SITE_OTHER): Payer: Self-pay

## 2017-05-19 ENCOUNTER — Ambulatory Visit (INDEPENDENT_AMBULATORY_CARE_PROVIDER_SITE_OTHER): Payer: BLUE CROSS/BLUE SHIELD | Admitting: Cardiovascular Disease

## 2017-05-19 ENCOUNTER — Encounter: Payer: Self-pay | Admitting: Cardiovascular Disease

## 2017-05-19 VITALS — BP 120/80 | HR 66 | Ht 74.0 in | Wt 256.0 lb

## 2017-05-19 DIAGNOSIS — E782 Mixed hyperlipidemia: Secondary | ICD-10-CM

## 2017-05-19 DIAGNOSIS — I5022 Chronic systolic (congestive) heart failure: Secondary | ICD-10-CM

## 2017-05-19 MED ORDER — SACUBITRIL-VALSARTAN 24-26 MG PO TABS: 1.0000 | ORAL_TABLET | Freq: Two times a day (BID) | ORAL | 11 refills | Status: DC

## 2017-05-19 MED ORDER — ASPIRIN EC 81 MG PO TBEC: 81.0000 mg | DELAYED_RELEASE_TABLET | Freq: Every day | ORAL | Status: DC

## 2017-05-19 NOTE — Progress Notes (Signed)
Cardiology Office Note   Date:  05/19/2017   ID:  Joseph [facility], DOB [birth year], MRN [id number]  PCP:  Hulan Fess, MD  Cardiologist:   Mertie Moores, MD   Chief Complaint  Patient presents with  . Follow-up    Chronic combine systolic and diastolic CHF   Problem list 1. Essential hypertension 2. Combined systolic and diastolic congestive heart failure-  3. COPD 4. Anxiety/depression 5. Hyperlipidemia  6.  TIA - on ASA 325 for his TIA     Joseph Foster is a 71 y.o. male who presents for evaluation of CHF Has not had and dyspnea.   Walks 3-5 a day , Monday - Friday . No PND or orthopnea. No CP , no syncope.   Still eats some salt  - eats soup once a week, hot dogs once a month. Eats deli meats that have some salt   Non-smoker Drinks lots of beer -  4-5 a day.  More on the weekend.   April 17, 2016:;     Doing well. Walks every day - several miles No CP or dyspnea.  Some fatigue climbing a hill  Nov. 15, 2017:  Joseph Foster is seen Today for his combined systolic and diastolic congestive heart failure. Heart catheterization on 04/29/2016 reveals mild LAD stenosis. His LV systolic function was normal. He has grade 1 diastolic dysfunction by echo   Aug. 20, 2018:  Doing well.    Walks 5 days a week.  Rides his motorcycle on the weekends. EF is 45-50% Avoids salt.    Past Medical History:  Diagnosis Date  . Acute medial meniscus tear of left knee   . Arthritis KNEES  . Chronic back pain   . DDD (degenerative disc disease), lumbar L4  . Depression   . Depression   . GERD (gastroesophageal reflux disease)   . Insomnia   . Memory loss   . Ringing in ears   . Stroke [facility])    ? TIA   . TIA (transient ischemic attack) 2015    Past Surgical History:  Procedure Laterality Date  . CARDIAC CATHETERIZATION N/A 04/29/2016   Procedure: Left Heart Cath and Coronary Angiography;  Surgeon: Leonie Man, MD;  Location: [facility];  Service:  Cardiovascular;  Laterality: N/A;  . HC EEG ADULT  08/23/2014      . KNEE ARTHROSCOPY  01/15/2012   Procedure: ARTHROSCOPY KNEE;  Surgeon: Gearlean Alf, MD;  Location: [facility];  Service: Orthopedics;  Laterality: Left;  meniscal debridement  . RIGHT KNEE SURG.  2005  . SPINAL CORD STIMULATOR IMPLANT  2009  . TONSILECTOMY/ADENOIDECTOMY WITH MYRINGOTOMY    . TOTAL KNEE ARTHROPLASTY Right 12/26/2014   Procedure: TOTAL RIGHT  KNEE ARTHROPLASTY;  Surgeon: Gaynelle Arabian, MD;  Location: [facility];  Service: Orthopedics;  Laterality: Right;  [name] VASECTOMY       Current Outpatient Prescriptions  Medication Sig Dispense Refill  . ALPRAZolam (XANAX) 0.5 MG tablet Take 0.5 mg by mouth 2 (two) times daily as needed for anxiety.     [name] aspirin 325 MG tablet Take 325 mg by mouth daily.    . carvedilol (COREG) 3.125 MG tablet TAKE 1 TABLET BY MOUTH TWICE A DAY 180 tablet 0  . cyclobenzaprine (FLEXERIL) 10 MG tablet Take 10 mg by mouth 3 (three) times daily as needed for muscle spasms.    [name] gabapentin (NEURONTIN) 300 MG capsule Take 300 mg by mouth as directed. 1 capsule  by mouth in the morning and 2 capsules by mouth at night    . losartan (COZAAR) 25 MG tablet TAKE 1 TABLET BY MOUTH DAILY 90 tablet 2  . morphine (MSIR) 15 MG tablet Take 15 mg by mouth every 4 (four) hours as needed for moderate pain or severe pain.    . Multiple Vitamin (MULTIVITAMIN) tablet Take 1 tablet by mouth daily.    [name] PARoxetine (PAXIL) 40 MG tablet Take 40 mg by mouth every morning.    . polyethylene glycol powder (GLYCOLAX/MIRALAX) powder Take 17 g by mouth at bedtime.    . sodium chloride (OCEAN) 0.65 % SOLN nasal spray Place 1 spray into both nostrils as needed for congestion.    . tadalafil (CIALIS) 20 MG tablet Take 10 mg by mouth daily as needed for erectile dysfunction.    . tamsulosin (FLOMAX) 0.4 MG CAPS capsule Take 0.4 mg by mouth daily.     No current facility-administered medications for this visit.      Allergies:   Codeine; Dilaudid [hydromorphone hcl]; Penicillins; Simvastatin; Ceclor [cefaclor]; Oxycontin [oxycodone hcl]; and Sulfa antibiotics    Social History:  The patient  reports that he quit smoking about 16 years ago. His smoking use included Cigars. His smokeless tobacco use includes Snuff. He reports that he drinks about 12.6 oz of alcohol per week . He reports that he does not use drugs.   Family History:  The patient's family history includes Arthritis in his mother; Heart disease in his father; Hyperlipidemia in his brother and mother; Leukemia in his mother.    ROS:  Please see the history of present illness.    Review of Systems: Constitutional:  denies fever, chills, diaphoresis, appetite change and fatigue.  HEENT: denies photophobia, eye pain, redness, hearing loss, ear pain, congestion, sore throat, rhinorrhea, sneezing, neck pain, neck stiffness and tinnitus.  Respiratory: denies SOB, DOE, cough, chest tightness, and wheezing.  Cardiovascular: denies chest pain, palpitations and leg swelling.  Gastrointestinal: denies nausea, vomiting, abdominal pain, diarrhea, constipation, blood in stool.  Genitourinary: denies dysuria, urgency, frequency, hematuria, flank pain and difficulty urinating.  Musculoskeletal: denies  myalgias, back pain, joint swelling, arthralgias and gait problem.   Skin: denies pallor, rash and wound.  Neurological: denies dizziness, seizures, syncope, weakness, light-headedness, numbness and headaches.   Hematological: denies adenopathy, easy bruising, personal or family bleeding history.  Psychiatric/ Behavioral: denies suicidal ideation, mood changes, confusion, nervousness, sleep disturbance and agitation.       All other systems are reviewed and negative.    PHYSICAL EXAM: VS:  BP 120/80   Pulse 66   Ht 6\' 2"  (1.88 m)   Wt 256 lb (116.1 kg)   BMI 32.87 kg/m  , BMI Body mass index is 32.87 kg/m. GEN: Well nourished, well  developed, in no acute distress  HEENT: normal  Neck: no JVD, carotid bruits, or masses Cardiac: RRR; no murmurs, rubs, or gallops,no edema  Respiratory:  clear to auscultation bilaterally, normal work of breathing GI: soft, nontender, nondistended, + BS MS: no deformity or atrophy  Skin: warm and dry, no rash Neuro:  Strength and sensation are intact Psych: normal   EKG:  EKG is ordered today. Aug. 20, 2018  The ekg ordered shows NSR at  66.    LAD   Recent Labs: No results found for requested labs within last 8760 hours.    Lipid Panel    Component Value Date/Time   CHOL 165 08/23/2014 0618   TRIG  131 08/23/2014 0618   HDL 51 08/23/2014 0618   CHOLHDL 3.2 08/23/2014 0618   VLDL 26 08/23/2014 0618   LDLCALC 88 08/23/2014 0618      Wt Readings from Last 3 Encounters:  05/19/17 256 lb (116.1 kg)  08/14/16 257 lb 6.4 oz (116.8 kg)  04/29/16 260 lb (117.9 kg)      Other studies Reviewed: Additional studies/ records that were reviewed today include: . Review of the above records demonstrates:    ASSESSMENT AND PLAN:  1. Essential hypertension 2. Combined systolic and diastolic congestive heart failure- Has normal cors by cath Will DC Losartan and try Entresto 24-26. Office visit in 3 months   3. COPD 4. Anxiety/depression 5. Hyperlipidemia - managed by his primary   6.  TIA - on ASA 325 for his TIA, Will decrease the dose to 81 mg a day     Disposition:   FU with me in 6  months      Mertie Moores, MD  05/19/2017 9:16 AM    [facility] [address], [place], [place]  [address] Phone: [phone]; Fax: [phone]

## 2017-05-19 NOTE — Patient Instructions (Addendum)
Medication Instructions:  STOP Losartan START Entresto 24-26 mg twice daily DECREASE Aspirin to 81 mg once daily   Labwork: Your physician recommends that you return for lab work in: 3 weeks for basic metabolic panel   Testing/Procedures: None Ordered   Follow-Up: Your physician recommends that you schedule a follow-up appointment in: 3 months with Dr. Acie Fredrickson   If you need a refill on your cardiac medications before your next appointment, please call your pharmacy.   Thank you for choosing [facility]! Christen Bame, RN [phone]

## 2017-06-03 ENCOUNTER — Other Ambulatory Visit: Payer: BLUE CROSS/BLUE SHIELD | Admitting: *Deleted

## 2017-06-03 DIAGNOSIS — I5022 Chronic systolic (congestive) heart failure: Secondary | ICD-10-CM

## 2017-06-04 LAB — BASIC METABOLIC PANEL
BUN/Creatinine Ratio: 18 (ref 10–24)
BUN: 19 mg/dL (ref 8–27)
CO2: 22 mmol/L (ref 20–29)
Calcium: 9.2 mg/dL (ref 8.6–10.2)
Chloride: 101 mmol/L (ref 96–106)
Creatinine, Ser: 1.04 mg/dL (ref 0.76–1.27)
GFR calc Af Amer: 84 mL/min/{1.73_m2} (ref >59)
GFR calc non Af Amer: 73 mL/min/{1.73_m2} (ref >59)
Glucose: 77 mg/dL (ref 65–99)
Potassium: 4.5 mmol/L (ref 3.5–5.2)
Sodium: 138 mmol/L (ref 134–144)

## 2017-06-09 ENCOUNTER — Other Ambulatory Visit: Payer: BLUE CROSS/BLUE SHIELD

## 2017-06-30 ENCOUNTER — Ambulatory Visit: Payer: BLUE CROSS/BLUE SHIELD | Admitting: Cardiovascular Disease

## 2017-07-17 ENCOUNTER — Other Ambulatory Visit: Payer: Self-pay | Admitting: Cardiovascular Disease

## 2017-08-07 IMAGING — NM NM MISC PROCEDURE
6 series · 36 of 36 positions shown · non-contrast
Comparison: none

[Series 1: rest · 6.51mm/px · 6 of 64 frames shown]
[frame 6/64]
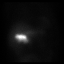
[frame 16/64]
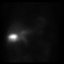
[frame 27/64]
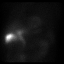
[frame 38/64]
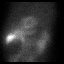
[frame 48/64]
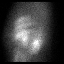
[frame 59/64]
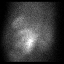

[Series 1: wbr_r-proj_st rest · 6.51mm/px · 6 of 64 frames shown]
[frame 6/64]
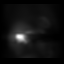
[frame 16/64]
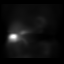
[frame 27/64]
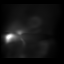
[frame 38/64]
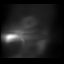
[frame 48/64]
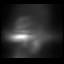
[frame 59/64]
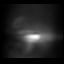

[Series 2: wbr_s-proj_st stress · 6.51mm/px · 6 of 512 frames shown (1 of 2)]
[frame 43/512]
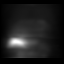
[frame 128/512]
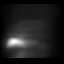
[frame 214/512]
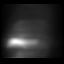
[frame 299/512]
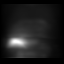
[frame 384/512]
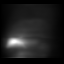
[frame 470/512]
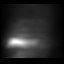

[Series 2: wbr_s-proj_st stress · 6.51mm/px · 6 of 64 frames shown (2 of 2)]
[frame 6/64]
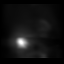
[frame 16/64]
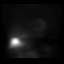
[frame 27/64]
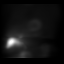
[frame 38/64]
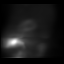
[frame 48/64]
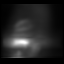
[frame 59/64]
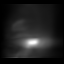

[Series 2: stress · 6.51mm/px · 6 of 64 frames shown (1 of 2)]
[frame 6/64]
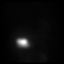
[frame 16/64]
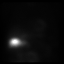
[frame 27/64]
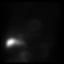
[frame 38/64]
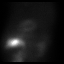
[frame 48/64]
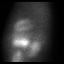
[frame 59/64]
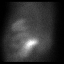

[Series 2: stress · 6.51mm/px · 6 of 512 frames shown (2 of 2)]
[frame 43/512]
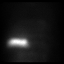
[frame 128/512]
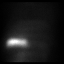
[frame 214/512]
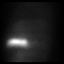
[frame 299/512]
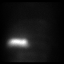
[frame 384/512]
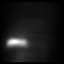
[frame 470/512]
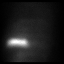

[36 of 36 positions shown; findings below may reference images not displayed]

Canned report from images found in remote index.

Refer to host system for actual result text.

## 2017-08-08 ENCOUNTER — Encounter: Payer: Self-pay | Admitting: Cardiovascular Disease

## 2017-08-19 ENCOUNTER — Ambulatory Visit: Payer: BLUE CROSS/BLUE SHIELD | Admitting: Cardiovascular Disease

## 2017-11-03 ENCOUNTER — Ambulatory Visit: Payer: BLUE CROSS/BLUE SHIELD | Admitting: Cardiovascular Disease

## 2017-11-03 ENCOUNTER — Encounter: Payer: Self-pay | Admitting: Cardiovascular Disease

## 2017-11-03 ENCOUNTER — Encounter (INDEPENDENT_AMBULATORY_CARE_PROVIDER_SITE_OTHER): Payer: Self-pay

## 2017-11-03 VITALS — BP 94/76 | HR 65 | Ht 74.0 in | Wt 260.4 lb

## 2017-11-03 DIAGNOSIS — I5022 Chronic systolic (congestive) heart failure: Secondary | ICD-10-CM

## 2017-11-03 DIAGNOSIS — I10 Essential (primary) hypertension: Secondary | ICD-10-CM | POA: Diagnosis not present

## 2017-11-03 LAB — BASIC METABOLIC PANEL
BUN/Creatinine Ratio: 16 (ref 10–24)
BUN: 19 mg/dL (ref 8–27)
CO2: 23 mmol/L (ref 20–29)
Calcium: 9.5 mg/dL (ref 8.6–10.2)
Chloride: 97 mmol/L (ref 96–106)
Creatinine, Ser: 1.19 mg/dL (ref 0.76–1.27)
GFR calc Af Amer: 72 mL/min/{1.73_m2} (ref >59)
GFR calc non Af Amer: 62 mL/min/{1.73_m2} (ref >59)
Glucose: 94 mg/dL (ref 65–99)
Potassium: 4.5 mmol/L (ref 3.5–5.2)
Sodium: 136 mmol/L (ref 134–144)

## 2017-11-03 NOTE — Patient Instructions (Signed)
Medication Instructions:  Your physician recommends that you continue on your current medications as directed. Please refer to the Current Medication list given to you today.   Labwork: TODAY - basic metabolic panel   Testing/Procedures: Your physician has requested that you have an echocardiogram in 6 months before your office visit with Dr. Acie Fredrickson. Echocardiography is a painless test that uses sound waves to create images of your heart. It provides your doctor with information about the size and shape of your heart and how well your heart's chambers and valves are working. This procedure takes approximately one hour. There are no restrictions for this procedure.   Follow-Up: Your physician wants you to follow-up in: 6 months with Dr. Acie Fredrickson.  You will receive a reminder letter in the mail two months in advance. If you don't receive a letter, please call our office to schedule the follow-up appointment.   If you need a refill on your cardiac medications before your next appointment, please call your pharmacy.   Thank you for choosing [facility]! Christen Bame, RN [phone]

## 2017-11-03 NOTE — Progress Notes (Signed)
Cardiology Office Note   Date:  11/03/2017   ID:  Joseph Foster, DOB 03/19/1946, MRN [id number]  PCP:  Joseph Fess, MD  Cardiologist:   Joseph Moores, MD   Chief Complaint  Patient presents with  . Congestive Heart Failure   Problem list 1. Essential hypertension 2. Combined systolic and diastolic congestive heart failure-  3. COPD 4. Anxiety/depression 5. Hyperlipidemia  6.  TIA - on ASA 325 for his TIA     Joseph Foster is a 72 y.o. male who presents for evaluation of CHF Has not had and dyspnea.   Walks 3-5 a day , Monday - Friday . No PND or orthopnea. No CP , no syncope.   Still eats some salt  - eats soup once a week, hot dogs once a month. Eats deli meats that have some salt   Non-smoker Drinks lots of beer -  4-5 a day.  More on the weekend.   April 17, 2016:;     Doing well. Walks every day - several miles No CP or dyspnea.  Some fatigue climbing a hill  Nov. 15, 2017:  Joseph Foster is seen Today for his combined systolic and diastolic congestive heart failure. Heart catheterization on 04/29/2016 reveals mild LAD stenosis. His LV systolic function was normal. He has grade 1 diastolic dysfunction by echo   Aug. 20, 2018:  Doing well.    Walks 5 days a week.  Rides his motorcycle on the weekends. EF is 45-50% Avoids salt.   Feb. 4, 2019:  Doing well.  Tolerating the Entresto BP is low.     Gets dizzy after drinking several beers.    Past Medical History:  Diagnosis Date  . Acute medial meniscus tear of left knee   . Arthritis KNEES  . Chronic back pain   . DDD (degenerative disc disease), lumbar L4  . Depression   . Depression   . GERD (gastroesophageal reflux disease)   . Insomnia   . Memory loss   . Ringing in ears   . Stroke [facility])    ? TIA   . TIA (transient ischemic attack) 2015    Past Surgical History:  Procedure Laterality Date  . CARDIAC CATHETERIZATION N/A 04/29/2016   Procedure: Left Heart Cath and Coronary Angiography;   Surgeon: Joseph Man, MD;  Location: [facility];  Service: Cardiovascular;  Laterality: N/A;  . HC EEG ADULT  08/23/2014      . KNEE ARTHROSCOPY  01/15/2012   Procedure: ARTHROSCOPY KNEE;  Surgeon: Joseph Alf, MD;  Location: [facility];  Service: Orthopedics;  Laterality: Left;  meniscal debridement  . RIGHT KNEE SURG.  2005  . SPINAL CORD STIMULATOR IMPLANT  2009  . TONSILECTOMY/ADENOIDECTOMY WITH MYRINGOTOMY    . TOTAL KNEE ARTHROPLASTY Right 12/26/2014   Procedure: TOTAL RIGHT  KNEE ARTHROPLASTY;  Surgeon: Joseph Arabian, MD;  Location: [facility];  Service: Orthopedics;  Laterality: Right;  [name] VASECTOMY       Current Outpatient Medications  Medication Sig Dispense Refill  . ALPRAZolam (XANAX) 0.5 MG tablet Take 0.5 mg by mouth 2 (two) times daily as needed for anxiety.     [name] aspirin EC 81 MG tablet Take 1 tablet (81 mg total) by mouth daily.    . Buprenorphine HCl 150 MCG FILM Place inside cheek.    . carvedilol (COREG) 3.125 MG tablet TAKE 1 TABLET BY MOUTH TWICE A DAY 180 tablet 2  . gabapentin (NEURONTIN) 300  MG capsule Take 300 mg by mouth as directed. 1 capsule by mouth in the morning and 2 capsules by mouth at night    . Multiple Vitamin (MULTIVITAMIN) tablet Take 1 tablet by mouth daily.    [name] PARoxetine (PAXIL) 40 MG tablet Take 40 mg by mouth every morning.    . polyethylene glycol powder (GLYCOLAX/MIRALAX) powder Take 17 g by mouth at bedtime.    . sacubitril-valsartan (ENTRESTO) 24-26 MG Take 1 tablet by mouth 2 (two) times daily. 60 tablet 11  . sodium chloride (OCEAN) 0.65 % SOLN nasal spray Place 1 spray into both nostrils as needed for congestion.    . tadalafil (CIALIS) 20 MG tablet Take 10 mg by mouth daily as needed for erectile dysfunction.    . tamsulosin (FLOMAX) 0.4 MG CAPS capsule Take 0.4 mg by mouth daily.     No current facility-administered medications for this visit.     Allergies:   Codeine; Dilaudid [hydromorphone hcl];  Penicillins; Simvastatin; Ceclor [cefaclor]; Oxycontin [oxycodone hcl]; and Sulfa antibiotics    Social History:  The patient  reports that he quit smoking about 16 years ago. His smoking use included cigars. His smokeless tobacco use includes snuff. He reports that he drinks about 12.6 oz of alcohol per week. He reports that he does not use drugs.   Family History:  The patient's family history includes Arthritis in his mother; Heart disease in his father; Hyperlipidemia in his brother and mother; Leukemia in his mother.    ROS:   See current history.  All other systems are negative   Physical Exam: Blood pressure 94/76, pulse 65, height 6\' 2"  (1.88 m), weight 260 lb 6.4 oz (118.1 kg), SpO2 97 %.  GEN:  Well nourished, well developed in no acute distress HEENT: Normal NECK: No JVD; No carotid bruits LYMPHATICS: No lymphadenopathy CARDIAC: RR , no murmurs, rubs, gallops RESPIRATORY:  Clear to auscultation without rales, wheezing or rhonchi  ABDOMEN: Soft, non-tender, non-distended MUSCULOSKELETAL:  No edema; No deformity  SKIN: Warm and dry NEUROLOGIC:  Alert and oriented x 3   EKG:    Recent Labs: 06/03/2017: BUN 19; Creatinine, Ser 1.04; Potassium 4.5; Sodium 138    Lipid Panel    Component Value Date/Time   CHOL 165 08/23/2014 0618   TRIG 131 08/23/2014 0618   HDL 51 08/23/2014 0618   CHOLHDL 3.2 08/23/2014 0618   VLDL 26 08/23/2014 0618   LDLCALC 88 08/23/2014 0618      Wt Readings from Last 3 Encounters:  11/03/17 260 lb 6.4 oz (118.1 kg)  05/19/17 256 lb (116.1 kg)  08/14/16 257 lb 6.4 oz (116.8 kg)      Other studies Reviewed: Additional studies/ records that were reviewed today include: . Review of the above records demonstrates:    ASSESSMENT AND PLAN:  1. Essential hypertension - BP is actually low - on Entresto   2. Combined systolic and diastolic congestive heart failure-  doing well on Entresto  Continue. Repeat echo in 6 months   3.  COPD 4. Anxiety/depression 5. Hyperlipidemia -   6.  TIA -     Disposition:   FU with me in 6  months      Joseph Moores, MD  11/03/2017 9:21 AM    [facility] [address] [place], [place], [place]  [address] Phone: [phone]; Fax: [phone]

## 2017-11-20 ENCOUNTER — Telehealth (HOSPITAL_COMMUNITY): Payer: Self-pay | Admitting: Cardiovascular Disease

## 2017-11-20 NOTE — Telephone Encounter (Signed)
User: Cherie Dark A Date/time: 11/20/17 1:50 PM  Comment: Called pt and lmsg for him to CB to move appt time from 8:30 to 9:30 on 05/05/18.Marland KitchenRG  Context:  Outcome: Left Message  Phone number: [phone] Phone Type: Home Phone  Comm. type: Telephone Call type: Outgoing  Contact: Vinnie Langton R Relation to patient: Self

## 2018-03-23 ENCOUNTER — Emergency Department (HOSPITAL_COMMUNITY)
Admission: EM | Admit: 2018-03-23 | Discharge: 2018-03-23 | Disposition: A | Discharge status: Home / Self Care | Payer: BLUE CROSS/BLUE SHIELD | Attending: Emergency Medicine | Admitting: Emergency Medicine

## 2018-03-23 ENCOUNTER — Encounter (HOSPITAL_COMMUNITY): Payer: Self-pay | Admitting: Emergency Medicine

## 2018-03-23 ENCOUNTER — Emergency Department (HOSPITAL_COMMUNITY): Payer: BLUE CROSS/BLUE SHIELD

## 2018-03-23 DIAGNOSIS — Z87891 Personal history of nicotine dependence: Secondary | ICD-10-CM | POA: Diagnosis not present

## 2018-03-23 DIAGNOSIS — Z79899 Other long term (current) drug therapy: Secondary | ICD-10-CM | POA: Insufficient documentation

## 2018-03-23 DIAGNOSIS — S61311A Laceration without foreign body of left index finger with damage to nail, initial encounter: Secondary | ICD-10-CM | POA: Insufficient documentation

## 2018-03-23 DIAGNOSIS — I2510 Atherosclerotic heart disease of native coronary artery without angina pectoris: Secondary | ICD-10-CM | POA: Diagnosis not present

## 2018-03-23 DIAGNOSIS — Z8673 Personal history of transient ischemic attack (TIA), and cerebral infarction without residual deficits: Secondary | ICD-10-CM | POA: Insufficient documentation

## 2018-03-23 DIAGNOSIS — Z7982 Long term (current) use of aspirin: Secondary | ICD-10-CM | POA: Insufficient documentation

## 2018-03-23 DIAGNOSIS — Y998 Other external cause status: Secondary | ICD-10-CM | POA: Diagnosis not present

## 2018-03-23 DIAGNOSIS — W260XXA Contact with knife, initial encounter: Secondary | ICD-10-CM | POA: Diagnosis not present

## 2018-03-23 DIAGNOSIS — I110 Hypertensive heart disease with heart failure: Secondary | ICD-10-CM | POA: Insufficient documentation

## 2018-03-23 DIAGNOSIS — Y929 Unspecified place or not applicable: Secondary | ICD-10-CM | POA: Insufficient documentation

## 2018-03-23 DIAGNOSIS — I5022 Chronic systolic (congestive) heart failure: Secondary | ICD-10-CM | POA: Diagnosis not present

## 2018-03-23 DIAGNOSIS — Y9389 Activity, other specified: Secondary | ICD-10-CM | POA: Diagnosis not present

## 2018-03-23 DIAGNOSIS — S61211A Laceration without foreign body of left index finger without damage to nail, initial encounter: Secondary | ICD-10-CM

## 2018-03-23 MED ORDER — DOXYCYCLINE HYCLATE 100 MG PO TABS
100.0000 mg | ORAL_TABLET | Freq: Once | ORAL | Status: AC
  Administered 2018-03-23: 100 mg via ORAL
  Filled 2018-03-23: qty 1

## 2018-03-23 MED ORDER — DOXYCYCLINE HYCLATE 100 MG PO CAPS: 100.0000 mg | ORAL_CAPSULE | Freq: Two times a day (BID) | ORAL | 0 refills | Status: DC

## 2018-03-23 MED ORDER — LIDOCAINE HCL (PF) 1 % IJ SOLN
20.0000 mL | Freq: Once | INTRAMUSCULAR | Status: AC
  Administered 2018-03-23: 20 mL
  Filled 2018-03-23: qty 20

## 2018-03-23 NOTE — Discharge Instructions (Addendum)
You have absorbable sutures in place the sutures should absorb about 10 days.  Please keep area covered, clean and dry.  You may shower but do not submerge the finger underwater.  You will need to follow-up with your primary care doctor in 2 days for a wound check, if you notice redness, swelling, worsening pain or drainage before the and these are signs of infection that should warrant sooner return.

## 2018-03-23 NOTE — ED Notes (Signed)
ED Provider at bedside. 

## 2018-03-23 NOTE — ED Notes (Signed)
Pt verbalized understanding discharge instructions and denies any further needs or questions at this time. VS stable, ambulatory and steady gait.   

## 2018-03-23 NOTE — ED Triage Notes (Addendum)
Pt states he cut the tip of his left pointer finger off while cutting his sandwich. Injury is on the pad of his finger and top of fingernail. Bleeding controlled.

## 2018-03-23 NOTE — ED Provider Notes (Signed)
[facility] EMERGENCY DEPARTMENT Provider Note   CSN: [id number] Arrival date & time: 03/23/18  1427     History   Chief Complaint Chief Complaint  Patient presents with  . Finger Injury    HPI Joseph Foster is a 72 y.o. male.  Joseph Foster is a 72 y.o. Male with a medical history as detailed below, presents to the emergency department for evaluation of laceration to the left index finger.  Patient reports prior to arrival he was cutting a sandwich when he cut the tip of his left index finger, he reports knife went through the majority of the finger and part of the nail.  He reports mild pain that is constant and throbbing.  He reports he is able to move the finger without difficulty, denies any numbness, tingling or weakness.  Tetanus is up-to-date.  Patient is not on any blood thinners.  No prior injuries to the finger.     Past Medical History:  Diagnosis Date  . Acute medial meniscus tear of left knee   . Arthritis KNEES  . Chronic back pain   . DDD (degenerative disc disease), lumbar L4  . Depression   . Depression   . GERD (gastroesophageal reflux disease)   . Insomnia   . Memory loss   . Ringing in ears   . Stroke [facility])    ? TIA   . TIA (transient ischemic attack) 2015    Patient Active Problem List   Diagnosis Date Noted  . Essential hypertension 08/14/2016  . Abnormal nuclear stress test 04/29/2016  . Coronary artery disease involving native coronary artery of native heart without angina pectoris   . Chronic systolic congestive heart failure ([place]) 09/01/2015  . OA (osteoarthritis) of knee 12/26/2014  . Agitation   . TIA (transient ischemic attack) 08/22/2014  . Tobacco abuse 08/22/2014  . Alcohol abuse 08/22/2014  . Chronic pain 08/22/2014  . Depression 08/22/2014  . Confusion   . Numbness 04/19/2013  . Acute medial meniscal tear 01/15/2012    Past Surgical History:  Procedure Laterality Date  . CARDIAC CATHETERIZATION N/A  04/29/2016   Procedure: Left Heart Cath and Coronary Angiography;  Surgeon: Leonie Man, MD;  Location: [facility];  Service: Cardiovascular;  Laterality: N/A;  . HC EEG ADULT  08/23/2014      . KNEE ARTHROSCOPY  01/15/2012   Procedure: ARTHROSCOPY KNEE;  Surgeon: Gearlean Alf, MD;  Location: [facility];  Service: Orthopedics;  Laterality: Left;  meniscal debridement  . RIGHT KNEE SURG.  2005  . SPINAL CORD STIMULATOR IMPLANT  2009  . TONSILECTOMY/ADENOIDECTOMY WITH MYRINGOTOMY    . TOTAL KNEE ARTHROPLASTY Right 12/26/2014   Procedure: TOTAL RIGHT  KNEE ARTHROPLASTY;  Surgeon: Gaynelle Arabian, MD;  Location: [facility];  Service: Orthopedics;  Laterality: Right;  [name] VASECTOMY          Home Medications    Prior to Admission medications   Medication Sig Start Date End Date Taking? Authorizing Provider  ALPRAZolam Duanne Moron) 0.5 MG tablet Take 0.5 mg by mouth 2 (two) times daily as needed for anxiety.     [provider]  aspirin EC 81 MG tablet Take 1 tablet (81 mg total) by mouth daily. 05/19/17   Nahser, Wonda Cheng, MD  Buprenorphine HCl 150 MCG FILM Place inside cheek. 10/15/17   [provider]  carvedilol (COREG) 3.125 MG tablet TAKE 1 TABLET BY MOUTH TWICE A DAY 07/17/17  Nahser, Wonda Cheng, MD  gabapentin (NEURONTIN) 300 MG capsule Take 300 mg by mouth as directed. 1 capsule by mouth in the morning and 2 capsules by mouth at night 05/17/17   [provider]  Multiple Vitamin (MULTIVITAMIN) tablet Take 1 tablet by mouth daily.    [provider]  PARoxetine (PAXIL) 40 MG tablet Take 40 mg by mouth every morning.    [provider]  polyethylene glycol powder (GLYCOLAX/MIRALAX) powder Take 17 g by mouth at bedtime.    [provider]  sacubitril-valsartan (ENTRESTO) 24-26 MG Take 1 tablet by mouth 2 (two) times daily. 05/19/17   Nahser, Wonda Cheng, MD  sodium chloride (OCEAN) 0.65 % SOLN nasal spray Place 1 spray into  both nostrils as needed for congestion.    [provider]  tadalafil (CIALIS) 20 MG tablet Take 10 mg by mouth daily as needed for erectile dysfunction.    [provider]  tamsulosin (FLOMAX) 0.4 MG CAPS capsule Take 0.4 mg by mouth daily.    [provider]    Family History Family History  Problem Relation Age of Onset  . Heart disease Father   . Leukemia Mother   . Hyperlipidemia Mother   . Arthritis Mother   . Hyperlipidemia Brother     Social History Social History   Tobacco Use  . Smoking status: Former Smoker    Types: Cigars    Last attempt to quit: 01/09/2001    Years since quitting: 17.2  . Smokeless tobacco: Current User    Types: Snuff  Substance Use Topics  . Alcohol use: Yes    Alcohol/week: 12.6 oz    Types: 7 Cans of beer, 14 Shots of liquor per week    Comment: 02/09/15 beer in summer, liquor in winter  . Drug use: No     Allergies   Codeine; Dilaudid [hydromorphone hcl]; Penicillins; Simvastatin; Ceclor [cefaclor]; Oxycontin [oxycodone hcl]; and Sulfa antibiotics   Review of Systems Review of Systems  Constitutional: Negative for chills and fever.  Musculoskeletal: Negative for arthralgias and joint swelling.  Skin: Positive for wound.  Neurological: Negative for numbness.     Physical Exam Updated Vital Signs BP 128/87 (BP Location: Right Arm)   Pulse 89   Temp 98.3 F (36.8 C) (Oral)   Resp 16   Ht 6\' 2"  (1.88 m)   Wt 120.7 kg (266 lb)   SpO2 98%   BMI 34.15 kg/m   Physical Exam  Constitutional: He appears well-developed and well-nourished. No distress.  HENT:  Head: Normocephalic and atraumatic.  Eyes: Right eye exhibits no discharge. Left eye exhibits no discharge.  Pulmonary/Chest: Effort normal. No respiratory distress.  Musculoskeletal:  Left index finger with laceration across the tip involving the distal portion of the nail, laceration appears to almost completely severed the tip of the finger,  with approximately half a centimeter of tissue and nail still intact, bleeding is controlled, normal range of motion and normal sensation (see photo below)  Neurological: He is alert. Coordination normal.  Skin: Skin is warm and dry. He is not diaphoretic.  Psychiatric: He has a normal mood and affect. His behavior is normal.  Nursing note and vitals reviewed.        ED Treatments / Results  Labs (all labs ordered are listed, but only abnormal results are displayed) Labs Reviewed - No data to display  EKG None  Radiology Dg Finger Index Left  Result Date: 03/23/2018 CLINICAL DATA:  Pt c/o  distal left index finger laceration after cutting his finger with a bread knife today. No hx of prior injuries or surgeries. EXAM: LEFT INDEX FINGER 2+V COMPARISON:  None. FINDINGS: Osseous alignment is normal. No fracture line or displaced fracture fragment. Soft tissue irregularity overlying the tuft of the distal phalanx, presumed laceration. IMPRESSION: 1. No osseous fracture or dislocation. 2. Soft tissue laceration overlying the tuft of the distal phalanx. No radiodense foreign body appreciated within the soft tissues. Electronically Signed   By: Franki Cabot M.D.   On: 03/23/2018 18:39    Procedures .Marland KitchenLaceration Repair Date/Time: 03/23/2018 8:43 PM Performed by: Jacqlyn Larsen, PA-C Authorized by: Jacqlyn Larsen, PA-C   Consent:    Consent obtained:  Verbal   Consent given by:  Patient   Risks discussed:  Infection, pain, poor wound healing and need for additional repair   Alternatives discussed:  No treatment Anesthesia (see MAR for exact dosages):    Anesthesia method:  Nerve block   Block location:  Digital- left index   Block needle gauge:  25 G   Block anesthetic:  Lidocaine 1% w/o epi   Block injection procedure:  Introduced needle and incremental injection   Block outcome:  Anesthesia achieved Laceration details:    Location:  Finger   Length (cm):  3 (Laceration of the  fingertip, with almost complete amputation of the tip of the finger, hanging on by approximately half centimeter of tissue)   Laceration depth: full thickness. Repair type:    Repair type:  Intermediate Pre-procedure details:    Preparation:  Imaging obtained to evaluate for foreign bodies and patient was prepped and draped in usual sterile fashion Exploration:    Hemostasis achieved with:  Direct pressure   Wound exploration: entire depth of wound probed and visualized     Wound extent: areolar tissue violated     Wound extent: no underlying fracture noted   Treatment:    Area cleansed with:  Saline   Amount of cleaning:  Extensive   Irrigation solution:  Sterile saline   Irrigation method:  Syringe Skin repair:    Repair method:  Sutures   Suture size:  5-0   Wound skin closure material used: Vicryl.   Suture technique:  Simple interrupted   Number of sutures:  15 Approximation:    Approximation:  Close Post-procedure details:    Dressing:  Antibiotic ointment and bulky dressing   Patient tolerance of procedure:  Tolerated well, no immediate complications Comments:     Patient almost completely cut off tip of his finger, no underlying fracture, laceration through the tip of the nail, this portion of the nail was removed, fingertip sewn back on with Vicryl repeat, sewn through the nail   (including critical care time)  Medications Ordered in ED Medications  lidocaine (PF) (XYLOCAINE) 1 % injection 20 mL (20 mLs Infiltration Given 03/23/18 1905)  doxycycline (VIBRA-TABS) tablet 100 mg (100 mg Oral Given 03/23/18 2029)     Initial Impression / Assessment and Plan / ED Course  I have reviewed the triage vital signs and the nursing notes.  Pertinent labs & imaging results that were available during my care of the patient were reviewed by me and considered in my medical decision making (see chart for details).  Pt presents for laceration of the left index finger, almost  completely cut off the tip of the finger, approximately 0.5 cm of tissue and nail still intact.  X-ray shows no evidence of underlying fracture,  finger is neurovascularly intact.  Tetanus is up-to-date, not on any blood thinners.  Discussed with patient that the fingertip may or may not remain viable, but will attempt to suture in place.  Good anesthesia with digital block, damage portion of the nail was removed, wound lines up extremely well, copiously irrigated and fingertip sutured in place with 15 simple interrupted dissolvable sutures, at the edge of the nail sutures were placed through the nail.  Will have patient follow-up in 2 days with his primary care doctor for a wound check.  Counseled on appropriate wound care.  Patient placed on antibiotics for infection prophylaxis, allergic to cephalosporins so doxycycline was prescribed.  Discussed signs of infection that should warrant sooner return.  Again discussed with patient that we will have to wait and see if the tip of the finger remains viable.  He expresses understanding and is in agreement with plan, stable for discharge home.  Final Clinical Impressions(s) / ED Diagnoses   Final diagnoses:  Laceration of left index finger without foreign body without damage to nail, initial encounter    ED Discharge Orders    None       Janet Berlin 03/24/18 0147    Isla Pence, MD 03/26/18 1204

## 2018-03-25 DIAGNOSIS — M79645 Pain in left finger(s): Secondary | ICD-10-CM | POA: Insufficient documentation

## 2018-03-25 DIAGNOSIS — S61219A Laceration without foreign body of unspecified finger without damage to nail, initial encounter: Secondary | ICD-10-CM | POA: Insufficient documentation

## 2018-04-09 ENCOUNTER — Other Ambulatory Visit: Payer: Self-pay | Admitting: Cardiovascular Disease

## 2018-05-05 ENCOUNTER — Other Ambulatory Visit (HOSPITAL_COMMUNITY): Payer: BLUE CROSS/BLUE SHIELD

## 2018-05-12 ENCOUNTER — Other Ambulatory Visit: Payer: Self-pay

## 2018-05-12 ENCOUNTER — Ambulatory Visit (HOSPITAL_COMMUNITY): Payer: BLUE CROSS/BLUE SHIELD | Attending: Cardiology

## 2018-05-12 ENCOUNTER — Encounter (INDEPENDENT_AMBULATORY_CARE_PROVIDER_SITE_OTHER): Payer: Self-pay

## 2018-05-12 DIAGNOSIS — J449 Chronic obstructive pulmonary disease, unspecified: Secondary | ICD-10-CM | POA: Diagnosis not present

## 2018-05-12 DIAGNOSIS — I77819 Aortic ectasia, unspecified site: Secondary | ICD-10-CM

## 2018-05-12 DIAGNOSIS — I10 Essential (primary) hypertension: Secondary | ICD-10-CM | POA: Diagnosis not present

## 2018-05-12 DIAGNOSIS — Z87891 Personal history of nicotine dependence: Secondary | ICD-10-CM | POA: Insufficient documentation

## 2018-05-12 DIAGNOSIS — I5189 Other ill-defined heart diseases: Secondary | ICD-10-CM

## 2018-05-12 DIAGNOSIS — I5022 Chronic systolic (congestive) heart failure: Secondary | ICD-10-CM | POA: Diagnosis not present

## 2018-05-12 DIAGNOSIS — E785 Hyperlipidemia, unspecified: Secondary | ICD-10-CM | POA: Diagnosis not present

## 2018-05-12 DIAGNOSIS — I110 Hypertensive heart disease with heart failure: Secondary | ICD-10-CM | POA: Insufficient documentation

## 2018-05-12 HISTORY — DX: Other ill-defined heart diseases: I51.89

## 2018-05-12 HISTORY — DX: Aortic ectasia, unspecified site: I77.819

## 2018-06-08 ENCOUNTER — Other Ambulatory Visit: Payer: Self-pay | Admitting: Cardiovascular Disease

## 2018-06-23 ENCOUNTER — Ambulatory Visit: Payer: BLUE CROSS/BLUE SHIELD | Admitting: Cardiovascular Disease

## 2018-06-23 ENCOUNTER — Encounter: Payer: Self-pay | Admitting: Cardiovascular Disease

## 2018-06-23 VITALS — BP 106/74 | HR 66 | Ht 74.0 in | Wt 265.0 lb

## 2018-06-23 DIAGNOSIS — I5042 Chronic combined systolic (congestive) and diastolic (congestive) heart failure: Secondary | ICD-10-CM

## 2018-06-23 DIAGNOSIS — I10 Essential (primary) hypertension: Secondary | ICD-10-CM

## 2018-06-23 LAB — BASIC METABOLIC PANEL
BUN/Creatinine Ratio: 21 (ref 10–24)
BUN: 25 mg/dL (ref 8–27)
CO2: 23 mmol/L (ref 20–29)
Calcium: 9.4 mg/dL (ref 8.6–10.2)
Chloride: 101 mmol/L (ref 96–106)
Creatinine, Ser: 1.2 mg/dL (ref 0.76–1.27)
GFR calc Af Amer: 70 mL/min/{1.73_m2} (ref >59)
GFR calc non Af Amer: 61 mL/min/{1.73_m2} (ref >59)
Glucose: 97 mg/dL (ref 65–99)
Potassium: 4.7 mmol/L (ref 3.5–5.2)
Sodium: 139 mmol/L (ref 134–144)

## 2018-06-23 NOTE — Patient Instructions (Signed)
Medication Instructions:  Your physician recommends that you continue on your current medications as directed. Please refer to the Current Medication list given to you today.  Labwork: BMET today  Testing/Procedures: None  Follow-Up: Your physician wants you to follow-up in: 6 months with a PA or NP.  You will receive a reminder letter in the mail two months in advance. If you don't receive a letter, please call our office to schedule the follow-up appointment.    Any Other Special Instructions Will Be Listed Below (If Applicable).     If you need a refill on your cardiac medications before your next appointment, please call your pharmacy.

## 2018-06-23 NOTE — Progress Notes (Signed)
Cardiology Office Note   Date:  06/23/2018   ID:  Joseph Foster, DOB [birth year], MRN [id number]  PCP:  Hulan Fess, MD  Cardiologist:   Mertie Moores, MD   Chief Complaint  Patient presents with  . Congestive Heart Failure   Problem list 1. Essential hypertension 2. Combined systolic and diastolic congestive heart failure-  3. COPD 4. Anxiety/depression 5. Hyperlipidemia  6.  TIA - on ASA 325 for his TIA     Joseph Foster is a 72 y.o. male who presents for evaluation of CHF Has not had and dyspnea.   Walks 3-5 a day , Monday - Friday . No PND or orthopnea. No CP , no syncope.   Still eats some salt  - eats soup once a week, hot dogs once a month. Eats deli meats that have some salt   Non-smoker Drinks lots of beer -  4-5 a day.  More on the weekend.   April 17, 2016:;     Doing well. Walks every day - several miles No CP or dyspnea.  Some fatigue climbing a hill  Nov. 15, 2017:  Joseph Foster is seen Today for his combined systolic and diastolic congestive heart failure. Heart catheterization on 04/29/2016 reveals mild LAD stenosis. His LV systolic function was normal. He has grade 1 diastolic dysfunction by echo   Aug. 20, 2018:  Doing well.    Walks 5 days a week.  Rides his motorcycle on the weekends. EF is 45-50% Avoids salt.   Feb. 4, 2019:  Doing well.  Tolerating the Entresto BP is low.     Gets dizzy after drinking several beers.    Sept. 24, 2019: Joseph Foster is seen back today for follow-up of his chronic combined systolic and diastolic congestive heart failure: Echocardiogram in 2015 revealed an ejection fraction of 45%.  Most recent echocardiogram in August, 2019 reveals normal left ventricular systolic function with an EF of 55%.  He has grade 1 diastolic dysfunction.  Walks daily ,  No CP or dyspnea.   Past Medical History:  Diagnosis Date  . Acute medial meniscus tear of left knee   . Arthritis KNEES  . Chronic back pain   . DDD  (degenerative disc disease), lumbar L4  . Depression   . Depression   . GERD (gastroesophageal reflux disease)   . Insomnia   . Memory loss   . Ringing in ears   . Stroke [facility])    ? TIA   . TIA (transient ischemic attack) 2015    Past Surgical History:  Procedure Laterality Date  . CARDIAC CATHETERIZATION N/A 04/29/2016   Procedure: Left Heart Cath and Coronary Angiography;  Surgeon: Leonie Man, MD;  Location: [place];  Service: Cardiovascular;  Laterality: N/A;  . HC EEG ADULT  08/23/2014      . KNEE ARTHROSCOPY  01/15/2012   Procedure: ARTHROSCOPY KNEE;  Surgeon: Gearlean Alf, MD;  Location: [facility];  Service: Orthopedics;  Laterality: Left;  meniscal debridement  . RIGHT KNEE SURG.  2005  . SPINAL CORD STIMULATOR IMPLANT  2009  . TONSILECTOMY/ADENOIDECTOMY WITH MYRINGOTOMY    . TOTAL KNEE ARTHROPLASTY Right 12/26/2014   Procedure: TOTAL RIGHT  KNEE ARTHROPLASTY;  Surgeon: Gaynelle Arabian, MD;  Location: [facility];  Service: Orthopedics;  Laterality: Right;  [name] VASECTOMY       Current Outpatient Medications  Medication Sig Dispense Refill  . ALPRAZolam (XANAX) 0.5 MG tablet Take 0.5 mg by  mouth 2 (two) times daily as needed for anxiety.     [name] aspirin EC 81 MG tablet Take 1 tablet (81 mg total) by mouth daily.    . Buprenorphine HCl 150 MCG FILM Place inside cheek.    . carvedilol (COREG) 3.125 MG tablet TAKE 1 TABLET BY MOUTH TWICE A DAY 180 tablet 1  . doxycycline (VIBRAMYCIN) 100 MG capsule Take 1 capsule (100 mg total) by mouth 2 (two) times daily. One po bid x 7 days 14 capsule 0  . ENTRESTO 24-26 MG TAKE 1 TABLET BY MOUTH TWICE A DAY 60 tablet 4  . gabapentin (NEURONTIN) 300 MG capsule Take 300 mg by mouth as directed. 1 capsule by mouth in the morning and 2 capsules by mouth at night    . Multiple Vitamin (MULTIVITAMIN) tablet Take 1 tablet by mouth daily.    [name] PARoxetine (PAXIL) 40 MG tablet Take 40 mg by mouth every morning.    .  polyethylene glycol powder (GLYCOLAX/MIRALAX) powder Take 17 g by mouth at bedtime.    . sodium chloride (OCEAN) 0.65 % SOLN nasal spray Place 1 spray into both nostrils as needed for congestion.    . tadalafil (CIALIS) 20 MG tablet Take 10 mg by mouth daily as needed for erectile dysfunction.    . tamsulosin (FLOMAX) 0.4 MG CAPS capsule Take 0.4 mg by mouth daily.     No current facility-administered medications for this visit.     Allergies:   Codeine; Dilaudid [hydromorphone hcl]; Penicillins; Simvastatin; Ceclor [cefaclor]; Oxycontin [oxycodone hcl]; and Sulfa antibiotics    Social History:  The patient  reports that he quit smoking about 17 years ago. His smoking use included cigars. His smokeless tobacco use includes snuff. He reports that he drinks about 21.0 standard drinks of alcohol per week. He reports that he does not use drugs.   Family History:  The patient's family history includes Arthritis in his mother; Heart disease in his father; Hyperlipidemia in his brother and mother; Leukemia in his mother.    ROS:   See current history.  All other systems are negative  Physical Exam: Blood pressure 106/74, pulse 66, height 6\' 2"  (1.88 m), weight 265 lb (120.2 kg), SpO2 96 %.  GEN:  Well nourished, well developed in no acute distress HEENT: Normal NECK: No JVD; No carotid bruits LYMPHATICS: No lymphadenopathy CARDIAC: RRR, no murmurs, rubs, gallops RESPIRATORY:  Clear to auscultation without rales, wheezing or rhonchi  ABDOMEN: Soft, non-tender, non-distended MUSCULOSKELETAL:  No edema; No deformity  SKIN: Warm and dry NEUROLOGIC:  Alert and oriented x 3   EKG: June 23, 2018:  Normal sinus rhythm at 66.  Left axis deviation. Artifact from the stimulator in his back.  Recent Labs: 11/03/2017: BUN 19; Creatinine, Ser 1.19; Potassium 4.5; Sodium 136    Lipid Panel    Component Value Date/Time   CHOL 165 08/23/2014 0618   TRIG 131 08/23/2014 0618   HDL 51  08/23/2014 0618   CHOLHDL 3.2 08/23/2014 0618   VLDL 26 08/23/2014 0618   LDLCALC 88 08/23/2014 0618      Wt Readings from Last 3 Encounters:  06/23/18 265 lb (120.2 kg)  03/23/18 266 lb (120.7 kg)  11/03/17 260 lb 6.4 oz (118.1 kg)      Other studies Reviewed: Additional studies/ records that were reviewed today include: . Review of the above records demonstrates:    ASSESSMENT AND PLAN:  1. Essential hypertension -blood pressures well controlled.  Continue current  medications.  2. Combined systolic and diastolic congestive heart failure-  Doing well.  He is currently on Entresto and carvedilol.  3. COPD 4. Anxiety/depression  5. Hyperlipidemia -check labs at next visit.  6.  TIA -     Disposition:   FU with  APP  in 6  months      Mertie Moores, MD  06/23/2018 10:19 AM    [facility] [address], [place], [place]  [address] Phone: [phone]; Fax: [phone]

## 2018-07-02 ENCOUNTER — Other Ambulatory Visit: Payer: Self-pay | Admitting: Family Medicine

## 2018-07-02 DIAGNOSIS — N6320 Unspecified lump in the left breast, unspecified quadrant: Secondary | ICD-10-CM

## 2018-07-08 ENCOUNTER — Ambulatory Visit
Admission: RE | Admit: 2018-07-08 | Discharge: 2018-07-08 | Disposition: A | Discharge status: Home / Self Care | Payer: BLUE CROSS/BLUE SHIELD | Source: Ambulatory Visit | Attending: Family Medicine | Admitting: Family Medicine

## 2018-07-08 ENCOUNTER — Ambulatory Visit: Payer: BLUE CROSS/BLUE SHIELD

## 2018-07-08 DIAGNOSIS — N6320 Unspecified lump in the left breast, unspecified quadrant: Secondary | ICD-10-CM

## 2018-07-09 ENCOUNTER — Other Ambulatory Visit: Payer: Self-pay | Admitting: Family Medicine

## 2018-07-09 DIAGNOSIS — R0981 Nasal congestion: Secondary | ICD-10-CM

## 2018-07-14 ENCOUNTER — Ambulatory Visit
Admission: RE | Admit: 2018-07-14 | Discharge: 2018-07-14 | Disposition: A | Discharge status: Home / Self Care | Payer: BLUE CROSS/BLUE SHIELD | Source: Ambulatory Visit | Attending: Family Medicine | Admitting: Family Medicine

## 2018-07-14 DIAGNOSIS — R0981 Nasal congestion: Secondary | ICD-10-CM

## 2018-07-30 ENCOUNTER — Telehealth: Payer: Self-pay

## 2018-07-30 NOTE — Telephone Encounter (Signed)
Surgery is > 2 months away. Please request for clearance in December.   I will route this recommendation to the requesting party via Epic fax function and remove from pre-op pool.  Please call with questions.  [place], [place] 07/30/2018, 12:45 PM

## 2018-07-30 NOTE — Telephone Encounter (Signed)
   [facility] Pre-operative Risk Assessment    Request for surgical clearance:  1. What type of surgery is being performed?  L Total Knee   2. When is this surgery scheduled? 11/09/18   3. What type of clearance is required (medical clearance vs. Pharmacy clearance to hold med vs. Both)? Medical  4. Are there any medications that need to be held prior to surgery and how long?    5. Practice name and name of physician performing surgery? [facility]/ Dr Wynelle Link   6. What is your office phone number [phone]    7.   What is your office fax number [phone]  8.   Anesthesia type (None, local, MAC, general) ? choice   Frederik Schmidt 07/30/2018, 11:12 AM  _________________________________________________________________   (provider comments below)

## 2018-08-26 ENCOUNTER — Other Ambulatory Visit: Payer: Self-pay | Admitting: Family Medicine

## 2018-08-26 DIAGNOSIS — R0981 Nasal congestion: Secondary | ICD-10-CM

## 2018-08-31 ENCOUNTER — Telehealth: Payer: Self-pay | Admitting: *Deleted

## 2018-08-31 NOTE — Telephone Encounter (Signed)
   [facility] Pre-operative Risk Assessment    Request for surgical clearance:  1. What type of surgery is being performed? LEFT TOTAL KNEE   2. When is this surgery scheduled? 11/09/18   3. Are there any medications that need to be held prior to surgery and how long? ASA   4. Practice name and name of physician performing surgery? [facility]; DR. Wynelle Link   5. What is your office phone and fax number? PH# [phone]; FAX# [phone] ATT: KELLY HANCOCK   6. Anesthesia type (None, local, MAC, general) ? CHOICE   Julaine Hua 08/31/2018, 12:55 PM  _________________________________________________________________   (provider comments below)

## 2018-09-02 NOTE — Telephone Encounter (Signed)
   Primary Cardiologist: Mertie Moores, MD  Chart reviewed as part of pre-operative protocol coverage. Patient was contacted 09/02/2018 in reference to pre-operative risk assessment for pending surgery as outlined below.  Joseph Foster was last seen on 06/23/18 by Dr.Nahser.  Since that day, Joseph Foster has done well. He denies anginal symptoms. No CP or dyspnea. He can walk up a flight of stairs >4 METS of physical activity w/o exertional symptoms.   Therefore, based on ACC/AHA guidelines, the patient would be at acceptable risk for the planned procedure without further cardiovascular testing. Ok to hold ASA 5-7 days prior to surgery if needed. Resume as soon as safe to do so from a surgical standpoint.   I will route this recommendation to the requesting party via Epic fax function and remove from pre-op pool.  Please call with questions.  Lyda Jester, PA-C 09/02/2018, 4:44 PM

## 2018-10-09 ENCOUNTER — Other Ambulatory Visit: Payer: Self-pay | Admitting: Cardiovascular Disease

## 2018-11-08 ENCOUNTER — Other Ambulatory Visit: Payer: Self-pay | Admitting: Cardiovascular Disease

## 2018-11-09 ENCOUNTER — Inpatient Hospital Stay: Admit: 2018-11-09 | Payer: BLUE CROSS/BLUE SHIELD | Admitting: Orthopedic Surgery

## 2018-11-09 SURGERY — ARTHROPLASTY, KNEE, TOTAL
Anesthesia: Choice | Laterality: Left

## 2018-11-27 ENCOUNTER — Other Ambulatory Visit: Payer: Self-pay | Admitting: Family Medicine

## 2018-11-27 DIAGNOSIS — Z87891 Personal history of nicotine dependence: Secondary | ICD-10-CM

## 2018-12-16 ENCOUNTER — Ambulatory Visit
Admission: RE | Admit: 2018-12-16 | Discharge: 2018-12-16 | Disposition: A | Discharge status: Home / Self Care | Payer: Self-pay | Source: Ambulatory Visit | Attending: Family Medicine | Admitting: Family Medicine

## 2018-12-16 DIAGNOSIS — Z87891 Personal history of nicotine dependence: Secondary | ICD-10-CM

## 2019-01-18 ENCOUNTER — Encounter: Payer: Self-pay | Admitting: Radiation Oncology

## 2019-01-18 NOTE — Progress Notes (Signed)
GU Location of Tumor / Histology: prostatic adenocarcinoma  If Prostate Cancer, Gleason Score is (3 + 4) and PSA is (2.93) on 11/25/2018. Prostate volume: 82.  Joseph Foster was found to have a prostate nodule by his PCP, Hulan Fess, in February 2020. Dr. Rex Kras referred him to Dr. Karsten Ro for further evaluation of the prostate nodule.   05/17/2005 PSA 1.20 09/27/2003 PSA 0.57  Biopsies of prostate (if applicable) revealed:    Past/Anticipated interventions by urology, if any: prostate biopsy, referral for consideration of brachytherapy  Past/Anticipated interventions by medical oncology, if any: no  Weight changes, if any: denies  Bowel/Bladder complaints, if any: IPSS 2. SHIM 1. Denies dysuria or hematuria. Denies urinary leakage or incontinence.   Nausea/Vomiting, if any: no  Pain issues, if any:  no  SAFETY ISSUES:  Prior radiation? no  Pacemaker/ICD? no  Possible current pregnancy? no, male patient  Is the patient on methotrexate? no  Current Complaints / other details:  72 year old male. Married. Smoker.

## 2019-01-19 ENCOUNTER — Ambulatory Visit
Admission: RE | Admit: 2019-01-19 | Discharge: 2019-01-19 | Disposition: A | Discharge status: Home / Self Care | Payer: BLUE CROSS/BLUE SHIELD | Source: Ambulatory Visit | Attending: Radiation Oncology | Admitting: Radiation Oncology

## 2019-01-19 ENCOUNTER — Encounter: Payer: Self-pay | Admitting: Radiation Oncology

## 2019-01-19 ENCOUNTER — Other Ambulatory Visit: Payer: Self-pay

## 2019-01-19 DIAGNOSIS — C61 Malignant neoplasm of prostate: Secondary | ICD-10-CM

## 2019-01-19 HISTORY — DX: Malignant neoplasm of prostate: C61

## 2019-01-19 NOTE — Progress Notes (Signed)
See progress note under physician encounter. 

## 2019-01-19 NOTE — Progress Notes (Signed)
Radiation Oncology         [phone] ________________________________  Initial Outpatient Consultation - Conducted via WebEx due to current COVID-19 concerns for limiting patient exposure  Name: Joseph Foster MRN: [id number]  Date: 01/19/2019  DOB: 12/18/1945  JM:[id number], Lennette Bihari, MD  Kathie Rhodes, MD   REFERRING PHYSICIAN: Kathie Rhodes, MD  DIAGNOSIS: 73 y.o. gentleman with Stage T1c adenocarcinoma of the prostate with Gleason score of 3+4, and PSA of 2.93.    ICD-10-CM   1. Malignant neoplasm of prostate ([place]) C61     HISTORY OF PRESENT ILLNESS: Joseph Foster is a 73 y.o. male with a diagnosis of prostate cancer. He was noted to have a firm right lobe on DRE at the time of his annual physical exam in February 2020 with his primary care physician, Dr. Hulan Fess.  PSA at that time was 2.93.  Accordingly, he was referred for evaluation in urology with Dr. Karsten Ro on 12/07/2018, and a digital rectal examination was performed at that time confirming right lobe firmness, but no discrete nodules.  The patient proceeded to transrectal ultrasound with 12 biopsies of the prostate on 12/30/2018.  The prostate volume measured 37.82 cc.  Out of 12 core biopsies, 1 was positive.  The maximum Gleason score was 3+4, and this was seen in the right apex lateral.  Biopsies of prostate revealed:    The patient reviewed the biopsy results with his urologist and he has kindly been referred today for discussion of potential radiation treatment options.  PREVIOUS RADIATION THERAPY: No  PAST MEDICAL HISTORY:  Past Medical History:  Diagnosis Date  . Acute medial meniscus tear of left knee   . Arthritis KNEES  . Chronic back pain   . DDD (degenerative disc disease), lumbar L4  . Depression   . Depression   . GERD (gastroesophageal reflux disease)   . Insomnia   . Memory loss   . Prostate cancer ([place])   . Ringing in ears   . Stroke [facility])    ? TIA   . TIA (transient ischemic attack) 2015    PAST SURGICAL HISTORY: Past Surgical History:  Procedure Laterality Date  . CARDIAC CATHETERIZATION N/A 04/29/2016   Procedure: Left Heart Cath and Coronary Angiography;  Surgeon: Leonie Man, MD;  Location: [place] [facility];  Service: Cardiovascular;  Laterality: N/A;  . HC EEG 41-60MINS  08/23/2014      . KNEE ARTHROSCOPY  01/15/2012   Procedure: ARTHROSCOPY KNEE;  Surgeon: Gearlean Alf, MD;  Location: [facility];  Service: Orthopedics;  Laterality: Left;  meniscal debridement  . RIGHT KNEE SURG.  2005  . SPINAL CORD STIMULATOR IMPLANT  2009  . TONSILECTOMY/ADENOIDECTOMY WITH MYRINGOTOMY    . TOTAL KNEE ARTHROPLASTY Right 12/26/2014   Procedure: TOTAL RIGHT  KNEE ARTHROPLASTY;  Surgeon: Gaynelle Arabian, MD;  Location: [facility];  Service: Orthopedics;  Laterality: Right;  [name] VASECTOMY      FAMILY HISTORY:  Family History  Problem Relation Age of Onset  . Heart disease Father   . Lung cancer Father        smoker  . Leukemia Mother   . Hyperlipidemia Mother   . Arthritis Mother   . Hyperlipidemia Brother     SOCIAL HISTORY:  Social History   Socioeconomic History  . Marital status: Married    Spouse name: Not on file  . Number of children: 3  . Years of education: Not on file  . Highest education  level: Not on file  Occupational History  . Occupation: [employer]    Comment: retired  Scientific laboratory technician  . Financial resource strain: Not on file  . Food insecurity:    Worry: Not on file    Inability: Not on file  . Transportation needs:    Medical: Not on file    Non-medical: Not on file  Tobacco Use  . Smoking status: Former Smoker    Packs/day: 0.25    Years: 20.00    Pack years: 5.00    Types: Cigars    Last attempt to quit: 01/09/2001    Years since quitting: 18.0  . Smokeless tobacco: Current User    Types: Snuff  Substance and Sexual Activity  . Alcohol use: Yes    Alcohol/week: 21.0 standard drinks    Types: 7 Cans of beer, 14 Shots of liquor per  week    Comment: 02/09/15 beer in summer, liquor in winter  . Drug use: No  . Sexual activity: Not on file  Lifestyle  . Physical activity:    Days per week: Not on file    Minutes per session: Not on file  . Stress: Not on file  Relationships  . Social connections:    Talks on phone: Not on file    Gets together: Not on file    Attends religious service: Not on file    Active member of club or organization: Not on file    Attends meetings of clubs or organizations: Not on file    Relationship status: Not on file  . Intimate partner violence:    Fear of current or ex partner: Not on file    Emotionally abused: Not on file    Physically abused: Not on file    Forced sexual activity: Not on file  Other Topics Concern  . Not on file  Social History Narrative   3 children but 1 passed away    ALLERGIES: Codeine; Dilaudid [hydromorphone hcl]; Penicillins; Simvastatin; Ceclor [cefaclor]; Oxycontin [oxycodone hcl]; and Sulfa antibiotics  MEDICATIONS:  Current Outpatient Medications  Medication Sig Dispense Refill  . ALPRAZolam (XANAX) 0.5 MG tablet Take 0.5 mg by mouth 2 (two) times daily as needed for anxiety.     [name] aspirin EC 81 MG tablet Take 1 tablet (81 mg total) by mouth daily.    . carvedilol (COREG) 3.125 MG tablet TAKE 1 TABLET BY MOUTH TWICE A DAY 180 tablet 2  . ENTRESTO 24-26 MG TAKE 1 TABLET BY MOUTH TWICE A DAY 60 tablet 2  . gabapentin (NEURONTIN) 600 MG tablet Take 600 mg by mouth 3 (three) times daily.    [name] ipratropium (ATROVENT) 0.03 % nasal spray SPRAY 2 SPRAYS INTO EACH NOSTRIL TWICE A DAY    . Multiple Vitamin (MULTIVITAMIN) tablet Take 1 tablet by mouth daily.    [name] PARoxetine (PAXIL) 40 MG tablet Take 40 mg by mouth every morning.    . sodium chloride (OCEAN) 0.65 % SOLN nasal spray Place 1 spray into both nostrils as needed for congestion.    . tadalafil (CIALIS) 20 MG tablet Take 10 mg by mouth daily as needed for erectile dysfunction.    . BELBUCA 75 MCG  FILM     . Buprenorphine HCl 150 MCG FILM Place inside cheek.    . cloNIDine (CATAPRES - DOSED IN MG/24 HR) 0.2 mg/24hr patch Place onto the skin.    [name] doxycycline (VIBRAMYCIN) 100 MG capsule Take 1 capsule (100 mg total) by mouth 2 (  two) times daily. One po bid x 7 days (Patient not taking: Reported on 01/19/2019) 14 capsule 0  . levofloxacin (LEVAQUIN) 750 MG tablet TAKE 1 TABLET BY MOUTH THE MORNING OF PROSTATE BIOPSY    . montelukast (SINGULAIR) 10 MG tablet     . polyethylene glycol powder (GLYCOLAX/MIRALAX) powder Take 17 g by mouth at bedtime.    . tamsulosin (FLOMAX) 0.4 MG CAPS capsule Take 0.4 mg by mouth daily.     No current facility-administered medications for this encounter.     REVIEW OF SYSTEMS:  On review of systems, the patient reports that he is doing well overall. He denies any chest pain, shortness of breath, cough, fevers, chills, night sweats, or unintended weight changes. He denies any bowel disturbances, and denies abdominal pain, nausea or vomiting. He denies any new musculoskeletal or joint aches or pains.  He reports a history of chronic low back pain which is currently managed by an implantable spinal cord stimulator which was placed in 2005.  He has also had multiple radiofrequency ablation procedures for management of his pain and associated neuropathy.  His IPSS was 2, indicating mild urinary symptoms. He denies dysuria, hematuria, leakage or incontinence. His SHIM was 1, indicating he does have severe erectile dysfunction. A complete review of systems is obtained and is otherwise negative.    PHYSICAL EXAM:  Wt Readings from Last 3 Encounters:  01/19/19 250 lb 6.4 oz (113.6 kg)  06/23/18 265 lb (120.2 kg)  03/23/18 266 lb (120.7 kg)   Temp Readings from Last 3 Encounters:  03/23/18 98.1 F (36.7 C) (Oral)  04/29/16 97.7 F (36.5 C) (Oral)  01/08/15 98.3 F (36.8 C) (Oral)   BP Readings from Last 3 Encounters:  06/23/18 106/74  03/23/18 134/87  11/03/17  94/76   Pulse Readings from Last 3 Encounters:  06/23/18 66  03/23/18 63  11/03/17 65   Pain Assessment Pain Score: 0-No pain/10  In general this is a well appearing caucasian male in no acute distress. He is alert and oriented x4 and appropriate throughout the examination. Cardiopulmonary assessment is negative for acute distress and he exhibits normal effort. Remainder of exam deferred in light of virtual consultation platform.  KPS = 90  100 - Normal; no complaints; no evidence of disease. 90   - Able to carry on normal activity; minor signs or symptoms of disease. 80   - Normal activity with effort; some signs or symptoms of disease. 2   - Cares for self; unable to carry on normal activity or to do active work. 60   - Requires occasional assistance, but is able to care for most of his personal needs. 50   - Requires considerable assistance and frequent medical care. 26   - Disabled; requires special care and assistance. 34   - Severely disabled; hospital admission is indicated although death not imminent. 64   - Very sick; hospital admission necessary; active supportive treatment necessary. 10   - Moribund; fatal processes progressing rapidly. 0     - Dead  Karnofsky DA, Abelmann [facility], Craver LS and Bur[facility] [phone]) The use of the nitrogen mustards in the palliative treatment of carcinoma: with particular reference to bronchogenic carcinoma Cancer 1 634-56  LABORATORY DATA:  Lab Results  Component Value Date   WBC 7.8 04/25/2016   HGB 13.7 04/25/2016   HCT 40.8 04/25/2016   MCV 91.9 04/25/2016   PLT 202 04/25/2016   Lab Results  Component Value Date  NA 139 06/23/2018   K 4.7 06/23/2018   CL 101 06/23/2018   CO2 23 06/23/2018   Lab Results  Component Value Date   ALT 19 12/19/2014   AST 21 12/19/2014   ALKPHOS 26 (L) 12/19/2014   BILITOT 0.6 12/19/2014     RADIOGRAPHY: No results found.    IMPRESSION/PLAN: 1. 73 y.o. gentleman with Stage T1c  adenocarcinoma of the prostate with Gleason Score of 3+4, and PSA of 2.93. We discussed the patient's workup and outlined the nature of prostate cancer in this setting. The patient's T stage, Gleason's score, and PSA put him into the favorable-intermediate risk group. Accordingly, he is eligible for a variety of potential treatment options including brachytherapy or 5.5 weeks of external radiation. We discussed the available radiation techniques, and focused on the details and logistics and delivery. We discussed and outlined the risks, benefits, short and long-term effects associated with radiotherapy and compared and contrasted these with prostatectomy. We also discussed the role of SpaceOAR in reducing the rectal toxicity associated with radiotherapy.   At the end of the conversation the patient is interested in moving forward with brachytherapy and use of SpaceOAR to reduce rectal toxicity from radiotherapy.  We will share our discussion with Dr. Karsten Ro and move forward with scheduling.  Romie Jumper, in our office, will be working closely with him to coordinate OR scheduling and pre and post procedure appointments.  We discussed the current delay in scheduling elective outpatient surgical procedures due to the COVID-19 pandemic. The patient understands that his brachytherapy procedure will likely take place in July or August 2020 but possibly sooner.  We will contact the pharmaceutical rep to ensure that Leonore is available at the time of procedure.  He will likely NOT be able to have a prostate MRI following his post-seed CT SIM to confirm appropriate distribution of the SpaceOAR due to implantable spinal stimulator which was placed in 2005.  This encounter was provided by telemedicine platform Webex.  The patient was contacted ahead of time and has given verbal consent for this type of encounter He has been advised to only accept a meeting of this type in a secure network environment. The time  spent during this encounter was 45 minutes. The attendants for this meeting include Tyler Pita MD, Ashlyn Bruning PA-C, scribe, Judd Lien and patient, Joseph Foster.  During the encounter, Tyler Pita MD, Ashlyn Bruning PA-C, and scribe, Judd Lien were located at [facility] Radiation Oncology Department.  Patient, Joseph Foster was located at home.     Nicholos Johns, PA-C    Tyler Pita, MD  [facility] Direct Dial: [phone]  Fax: [phone] [email or web address]  Skype  LinkedIn  This document serves as a record of services personally performed by Tyler Pita, MD and Freeman Caldron, PA-C. It was created on their behalf by Rae Lips, a trained medical scribe. The creation of this record is based on the scribe's personal observations and the providers' statements to them. This document has been checked and approved by the attending providers.

## 2019-01-20 DIAGNOSIS — C61 Malignant neoplasm of prostate: Secondary | ICD-10-CM | POA: Insufficient documentation

## 2019-01-26 ENCOUNTER — Telehealth: Payer: Self-pay | Admitting: Medical Oncology

## 2019-01-26 NOTE — Telephone Encounter (Signed)
Called patient to introduce myself as the prostate nurse navigator and my role. I was unable to meet him during consult with Dr. Tammi Klippel due to consult was conducted via WebEx due to current COVID-19 concerns for limiting patient exposure. He is interested in brachytherapy to treat his prostate cancer. He is aware his surgery will not be schedule until ? July/August due to the virus and Enid Derry will call him with dates. I informed him I am working remotely and if he has questions or concerns to leave me a message in my office and I will return his call.

## 2019-01-29 ENCOUNTER — Telehealth: Payer: Self-pay | Admitting: *Deleted

## 2019-01-29 ENCOUNTER — Other Ambulatory Visit: Payer: Self-pay | Admitting: Urology

## 2019-01-29 NOTE — Telephone Encounter (Signed)
CALLED PATIENT TO INFORM OF PRE-SEED PLANNING CT AND HIS IMPLANT, LVM FOR A RETURN CALL

## 2019-02-02 ENCOUNTER — Other Ambulatory Visit: Payer: Self-pay | Admitting: Urology

## 2019-02-04 ENCOUNTER — Other Ambulatory Visit: Payer: Self-pay | Admitting: Urology

## 2019-02-06 ENCOUNTER — Other Ambulatory Visit: Payer: Self-pay | Admitting: Cardiovascular Disease

## 2019-02-09 ENCOUNTER — Encounter: Payer: Self-pay | Admitting: Urology

## 2019-02-09 ENCOUNTER — Telehealth: Payer: Self-pay | Admitting: *Deleted

## 2019-02-09 NOTE — Telephone Encounter (Signed)
CALLED PATIENT TO REMIND OF PRE-SEED PLANNING CT AND CHEST X-RAY AND EKG FOR 02-19-19- ARRIVAL TIME - 12:45 PM @ [place] IMPLANT ON 03-15-19, SPOKE WITH PATIENT AND HE IS AWARE OF THESE APPTS.

## 2019-02-09 NOTE — Progress Notes (Signed)
No prostate MRI due to h/o spinal cord stimulator placed in 2005.

## 2019-02-18 ENCOUNTER — Telehealth: Payer: Self-pay | Admitting: *Deleted

## 2019-02-18 NOTE — Telephone Encounter (Signed)
CALLED PATIENT TO REMIND OF PRE-SEED APPTS. FOR 02-19-19, LVM FOR A RETURN CALL

## 2019-02-18 NOTE — Progress Notes (Signed)
  Radiation Oncology         [phone] ________________________________  Name: DEMECO DUCKSWORTH MRN: [id number]  Date: 02/19/2019  DOB: [birth year]  SIMULATION AND TREATMENT PLANNING NOTE PUBIC ARCH STUDY  [id number], Lennette Bihari, MD  Hulan Fess, MD  DIAGNOSIS: 73 y.o. gentleman with Stage T1c adenocarcinoma of the prostate with Gleason score of 3+4, and PSA of 2.93     ICD-10-CM   1. Malignant neoplasm of prostate ([place]) C61     COMPLEX SIMULATION:  The patient presented today for evaluation for possible prostate seed implant. He was brought to the radiation planning suite and placed supine on the CT couch. A 3-dimensional image study set was obtained in upload to the planning computer. There, on each axial slice, I contoured the prostate gland. Then, using three-dimensional radiation planning tools I reconstructed the prostate in view of the structures from the transperineal needle pathway to assess for possible pubic arch interference. In doing so, I did not appreciate any pubic arch interference. Also, the patient's prostate volume was estimated based on the drawn structure. The volume was 37 cc.  Given the pubic arch appearance and prostate volume, patient remains a good candidate to proceed with prostate seed implant. Today, he freely provided informed written consent to proceed.    PLAN: The patient will undergo prostate seed implant.   ________________________________  Sheral Apley. Tammi Klippel, M.D.

## 2019-02-19 ENCOUNTER — Ambulatory Visit
Admission: RE | Admit: 2019-02-19 | Discharge: 2019-02-19 | Disposition: A | Discharge status: Home / Self Care | Payer: BC Managed Care – PPO | Source: Ambulatory Visit | Attending: Radiation Oncology | Admitting: Radiation Oncology

## 2019-02-19 ENCOUNTER — Ambulatory Visit (HOSPITAL_COMMUNITY)
Admission: RE | Admit: 2019-02-19 | Discharge: 2019-02-19 | Disposition: A | Discharge status: Home / Self Care | Payer: BLUE CROSS/BLUE SHIELD | Source: Ambulatory Visit | Attending: Urology | Admitting: Urology

## 2019-02-19 ENCOUNTER — Ambulatory Visit
Admission: RE | Admit: 2019-02-19 | Discharge: 2019-02-19 | Disposition: A | Discharge status: Home / Self Care | Payer: BLUE CROSS/BLUE SHIELD | Source: Ambulatory Visit | Attending: Urology | Admitting: Urology

## 2019-02-19 ENCOUNTER — Other Ambulatory Visit: Payer: Self-pay

## 2019-02-19 ENCOUNTER — Encounter (HOSPITAL_COMMUNITY)
Admission: RE | Admit: 2019-02-19 | Discharge: 2019-02-19 | Disposition: A | Discharge status: Home / Self Care | Payer: BLUE CROSS/BLUE SHIELD | Source: Ambulatory Visit | Attending: Urology | Admitting: Urology

## 2019-02-19 ENCOUNTER — Encounter: Payer: Self-pay | Admitting: Medical Oncology

## 2019-02-19 VITALS — BP 126/84 | HR 94 | Temp 98.0°F | Resp 20 | Ht 74.0 in | Wt 257.6 lb

## 2019-02-19 DIAGNOSIS — C61 Malignant neoplasm of prostate: Secondary | ICD-10-CM | POA: Diagnosis present

## 2019-02-19 DIAGNOSIS — I444 Left anterior fascicular block: Secondary | ICD-10-CM

## 2019-02-19 HISTORY — DX: Left anterior fascicular block: I44.4

## 2019-02-23 ENCOUNTER — Telehealth: Payer: Self-pay

## 2019-02-23 NOTE — Telephone Encounter (Signed)
YOUR CARDIOLOGY TEAM HAS ARRANGED FOR AN E-VISIT FOR YOUR APPOINTMENT - PLEASE REVIEW IMPORTANT INFORMATION BELOW SEVERAL DAYS PRIOR TO YOUR APPOINTMENT  Due to the recent COVID-19 pandemic, we are transitioning in-person office visits to tele-medicine visits in an effort to decrease unnecessary exposure to our patients, their families, and staff. These visits are billed to your insurance just like a normal visit is. We also encourage you to sign up for MyChart if you have not already done so. You will need a smartphone if possible. For patients that do not have this, we can still complete the visit using a regular telephone but do prefer a smartphone to enable video when possible. You may have a family member that lives with you that can help. If possible, we also ask that you have a blood pressure cuff and scale at home to measure your blood pressure, heart rate and weight prior to your scheduled appointment. Patients with clinical needs that need an in-person evaluation and testing will still be able to come to the office if absolutely necessary. If you have any questions, feel free to call our office.     YOUR PROVIDER WILL BE USING THE FOLLOWING PLATFORM TO COMPLETE YOUR VISIT: Doximity  . IF USING MYCHART - How to Download the MyChart App to Your SmartPhone   - If Apple, go to App Store and type in MyChart in the search bar and download the app. If Android, ask patient to go to Google Play Store and type in MyChart in the search bar and download the app. The app is free but as with any other app downloads, your phone may require you to verify saved payment information or Apple/Android password.  - You will need to then log into the app with your MyChart username and password, and select [facility] as your healthcare provider to link the account.  - When it is time for your visit, go to the MyChart app, find appointments, and click Begin Video Visit. Be sure to Select Allow for your device to  access the Microphone and Camera for your visit. You will then be connected, and your provider will be with you shortly.  **If you have any issues connecting or need assistance, please contact MyChart service desk [phone] ([phone])**  **If using a computer, in order to ensure the best quality for your visit, you will need to use either of the following Internet Browsers: Google Chrome or Microsoft Edge**  . IF USING DOXIMITY or [email or web address].ME - The staff will give you instructions on receiving your link to join the meeting the day of your visit.      2-3 DAYS BEFORE YOUR APPOINTMENT  You will receive a telephone call from one of our [facility] team members - your caller ID may say "Unknown caller." If this is a video visit, we will walk you through how to get the video launched on your phone. We will remind you check your blood pressure, heart rate and weight prior to your scheduled appointment. If you have an Apple Watch or Kardia, please upload any pertinent ECG strips the day before or morning of your appointment to MyChart. Our staff will also make sure you have reviewed the consent and agree to move forward with your scheduled tele-health visit.     THE DAY OF YOUR APPOINTMENT  Approximately 15 minutes prior to your scheduled appointment, you will receive a telephone call from one of [facility] team - your caller ID may say "Unknown caller."    Our staff will confirm medications, vital signs for the day and any symptoms you may be experiencing. Please have this information available prior to the time of visit start. It may also be helpful for you to have a pad of paper and pen handy for any instructions given during your visit. They will also walk you through joining the smartphone meeting if this is a video visit.    CONSENT FOR TELE-HEALTH VISIT - PLEASE REVIEW  I hereby voluntarily request, consent and authorize [facility] and its employed or contracted physicians, physician  assistants, nurse practitioners or other licensed health care professionals (the Practitioner), to provide me with telemedicine health care services (the "Services") as deemed necessary by the treating Practitioner. I acknowledge and consent to receive the Services by the Practitioner via telemedicine. I understand that the telemedicine visit will involve communicating with the Practitioner through live audiovisual communication technology and the disclosure of certain medical information by electronic transmission. I acknowledge that I have been given the opportunity to request an in-person assessment or other available alternative prior to the telemedicine visit and am voluntarily participating in the telemedicine visit.  I understand that I have the right to withhold or withdraw my consent to the use of telemedicine in the course of my care at any time, without affecting my right to future care or treatment, and that the Practitioner or I may terminate the telemedicine visit at any time. I understand that I have the right to inspect all information obtained and/or recorded in the course of the telemedicine visit and may receive copies of available information for a reasonable fee.  I understand that some of the potential risks of receiving the Services via telemedicine include:  . Delay or interruption in medical evaluation due to technological equipment failure or disruption; . Information transmitted may not be sufficient (e.g. poor resolution of images) to allow for appropriate medical decision making by the Practitioner; and/or  . In rare instances, security protocols could fail, causing a breach of personal health information.  Furthermore, I acknowledge that it is my responsibility to provide information about my medical history, conditions and care that is complete and accurate to the best of my ability. I acknowledge that Practitioner's advice, recommendations, and/or decision may be based on  factors not within their control, such as incomplete or inaccurate data provided by me or distortions of diagnostic images or specimens that may result from electronic transmissions. I understand that the practice of medicine is not an exact science and that Practitioner makes no warranties or guarantees regarding treatment outcomes. I acknowledge that I will receive a copy of this consent concurrently upon execution via email to the email address I last provided but may also request a printed copy by calling the office of [facility].    I understand that my insurance will be billed for this visit.   I have read or had this consent read to me. . I understand the contents of this consent, which adequately explains the benefits and risks of the Services being provided via telemedicine.  . I have been provided ample opportunity to ask questions regarding this consent and the Services and have had my questions answered to my satisfaction. . I give my informed consent for the services to be provided through the use of telemedicine in my medical care  By participating in this telemedicine visit I agree to the above.  

## 2019-02-24 ENCOUNTER — Telehealth (INDEPENDENT_AMBULATORY_CARE_PROVIDER_SITE_OTHER): Payer: BLUE CROSS/BLUE SHIELD | Admitting: Cardiology

## 2019-02-24 ENCOUNTER — Encounter: Payer: Self-pay | Admitting: Cardiology

## 2019-02-24 ENCOUNTER — Other Ambulatory Visit: Payer: Self-pay

## 2019-02-24 VITALS — Ht 74.0 in | Wt 257.0 lb

## 2019-02-24 DIAGNOSIS — C61 Malignant neoplasm of prostate: Secondary | ICD-10-CM

## 2019-02-24 DIAGNOSIS — Z9682 Presence of neurostimulator: Secondary | ICD-10-CM

## 2019-02-24 DIAGNOSIS — G8929 Other chronic pain: Secondary | ICD-10-CM | POA: Diagnosis not present

## 2019-02-24 DIAGNOSIS — Z01818 Encounter for other preprocedural examination: Secondary | ICD-10-CM

## 2019-02-24 DIAGNOSIS — I5042 Chronic combined systolic (congestive) and diastolic (congestive) heart failure: Secondary | ICD-10-CM | POA: Diagnosis not present

## 2019-02-24 DIAGNOSIS — Z8673 Personal history of transient ischemic attack (TIA), and cerebral infarction without residual deficits: Secondary | ICD-10-CM

## 2019-02-24 DIAGNOSIS — I110 Hypertensive heart disease with heart failure: Secondary | ICD-10-CM

## 2019-02-24 DIAGNOSIS — I10 Essential (primary) hypertension: Secondary | ICD-10-CM

## 2019-02-24 DIAGNOSIS — Z01810 Encounter for preprocedural cardiovascular examination: Secondary | ICD-10-CM

## 2019-02-24 NOTE — Patient Instructions (Signed)
Medication Instructions:  Your physician recommends that you continue on your current medications as directed. Please refer to the Current Medication list given to you today.  If you need a refill on your cardiac medications before your next appointment, please call your pharmacy.   Lab work: None  If you have labs (blood work) drawn today and your tests are completely normal, you will receive your results only by: [name] MyChart Message (if you have MyChart) OR . A paper copy in the mail If you have any lab test that is abnormal or we need to change your treatment, we will call you to review the results.  Testing/Procedures: None  Follow-Up: At [facility], you and your health needs are our priority.  As part of our continuing mission to provide you with exceptional heart care, we have created designated Provider Care Teams.  These Care Teams include your primary Cardiologist (physician) and Advanced Practice Providers (APPs -  Physician Assistants and Nurse Practitioners) who all work together to provide you with the care you need, when you need it. You will need a follow up appointment in:  6 months.  Please call our office 2 months in advance to schedule this appointment.  You may see Mertie Moores, MD or one of the following Advanced Practice Providers on your designated Care Team: Richardson Dopp, PA-C [place], [place] . Daune Perch, NP  Any Other Special Instructions Will Be Listed Below (If Applicable).   DASH Eating Plan DASH stands for "Dietary Approaches to Stop Hypertension." The DASH eating plan is a healthy eating plan that has been shown to reduce high blood pressure (hypertension). It may also reduce your risk for type 2 diabetes, heart disease, and stroke. The DASH eating plan may also help with weight loss. What are tips for following this plan?  General guidelines  Avoid eating more than 2,300 mg (milligrams) of salt (sodium) a day. If you have hypertension, you may  need to reduce your sodium intake to 1,500 mg a day.  Limit alcohol intake to no more than 1 drink a day for nonpregnant women and 2 drinks a day for men. One drink equals 12 oz of beer, 5 oz of wine, or 1 oz of hard liquor.  Work with your health care provider to maintain a healthy body weight or to lose weight. Ask what an ideal weight is for you.  Get at least 30 minutes of exercise that causes your heart to beat faster (aerobic exercise) most days of the week. Activities may include walking, swimming, or biking.  Work with your health care provider or diet and nutrition specialist (dietitian) to adjust your eating plan to your individual calorie needs. Reading food labels   Check food labels for the amount of sodium per serving. Choose foods with less than 5 percent of the Daily Value of sodium. Generally, foods with less than 300 mg of sodium per serving fit into this eating plan.  To find whole grains, look for the word "whole" as the first word in the ingredient list. Shopping  Buy products labeled as "low-sodium" or "no salt added."  Buy fresh foods. Avoid canned foods and premade or frozen meals. Cooking  Avoid adding salt when cooking. Use salt-free seasonings or herbs instead of table salt or sea salt. Check with your health care provider or pharmacist before using salt substitutes.  Do not fry foods. Cook foods using healthy methods such as baking, boiling, grilling, and broiling instead.  Cook with heart-healthy  oils, such as olive, canola, soybean, or sunflower oil. Meal planning  Eat a balanced diet that includes: ? 5 or more servings of fruits and vegetables each day. At each meal, try to fill half of your plate with fruits and vegetables. ? Up to 6-8 servings of whole grains each day. ? Less than 6 oz of lean meat, poultry, or fish each day. A 3-oz serving of meat is about the same size as a deck of cards. One egg equals 1 oz. ? 2 servings of low-fat dairy each day.  ? A serving of nuts, seeds, or beans 5 times each week. ? Heart-healthy fats. Healthy fats called Omega-3 fatty acids are found in foods such as flaxseeds and coldwater fish, like sardines, salmon, and mackerel.  Limit how much you eat of the following: ? Canned or prepackaged foods. ? Food that is high in trans fat, such as fried foods. ? Food that is high in saturated fat, such as fatty meat. ? Sweets, desserts, sugary drinks, and other foods with added sugar. ? Full-fat dairy products.  Do not salt foods before eating.  Try to eat at least 2 vegetarian meals each week.  Eat more home-cooked food and less restaurant, buffet, and fast food.  When eating at a restaurant, ask that your food be prepared with less salt or no salt, if possible. What foods are recommended? The items listed may not be a complete list. Talk with your dietitian about what dietary choices are best for you. Grains Whole-grain or whole-wheat bread. Whole-grain or whole-wheat pasta. Brown rice. [place]. Bulgur. Whole-grain and low-sodium cereals. Pita bread. Low-fat, low-sodium crackers. Whole-wheat flour tortillas. Vegetables Fresh or frozen vegetables (raw, steamed, roasted, or grilled). Low-sodium or reduced-sodium tomato and vegetable juice. Low-sodium or reduced-sodium tomato sauce and tomato paste. Low-sodium or reduced-sodium canned vegetables. Fruits All fresh, dried, or frozen fruit. Canned fruit in natural juice (without added sugar). Meat and other protein foods Skinless chicken or [place]. Ground chicken or [place]. Pork with fat trimmed off. Fish and seafood. Egg whites. Dried beans, peas, or lentils. Unsalted nuts, nut butters, and seeds. Unsalted canned beans. Lean cuts of beef with fat trimmed off. Low-sodium, lean deli meat. Dairy Low-fat (1%) or fat-free (skim) milk. Fat-free, low-fat, or reduced-fat cheeses. Nonfat, low-sodium ricotta or cottage cheese. Low-fat or nonfat yogurt. Low-fat,  low-sodium cheese. Fats and oils Soft margarine without trans fats. Vegetable oil. Low-fat, reduced-fat, or light mayonnaise and salad dressings (reduced-sodium). Canola, safflower, olive, soybean, and sunflower oils. Avocado. Seasoning and other foods Herbs. Spices. Seasoning mixes without salt. Unsalted popcorn and pretzels. Fat-free sweets. What foods are not recommended? The items listed may not be a complete list. Talk with your dietitian about what dietary choices are best for you. Grains Baked goods made with fat, such as croissants, muffins, or some breads. Dry pasta or rice meal packs. Vegetables Creamed or fried vegetables. Vegetables in a cheese sauce. Regular canned vegetables (not low-sodium or reduced-sodium). Regular canned tomato sauce and paste (not low-sodium or reduced-sodium). Regular tomato and vegetable juice (not low-sodium or reduced-sodium). Angie Fava. Olives. Fruits Canned fruit in a light or heavy syrup. Fried fruit. Fruit in cream or butter sauce. Meat and other protein foods Fatty cuts of meat. Ribs. Fried meat. Berniece Salines. Sausage. Bologna and other processed lunch meats. Salami. Fatback. Hotdogs. Bratwurst. Salted nuts and seeds. Canned beans with added salt. Canned or smoked fish. Whole eggs or egg yolks. Chicken or [place] with skin. Dairy Whole or 2% milk,  cream, and half-and-half. Whole or full-fat cream cheese. Whole-fat or sweetened yogurt. Full-fat cheese. Nondairy creamers. Whipped toppings. Processed cheese and cheese spreads. Fats and oils Butter. Stick margarine. Lard. Shortening. Ghee. Bacon fat. Tropical oils, such as coconut, palm kernel, or palm oil. Seasoning and other foods Salted popcorn and pretzels. Onion salt, garlic salt, seasoned salt, table salt, and sea salt. Worcestershire sauce. Tartar sauce. Barbecue sauce. Teriyaki sauce. Soy sauce, including reduced-sodium. Steak sauce. Canned and packaged gravies. Fish sauce. Oyster sauce. Cocktail sauce.  Horseradish that you find on the shelf. Ketchup. Mustard. Meat flavorings and tenderizers. Bouillon cubes. Hot sauce and Tabasco sauce. Premade or packaged marinades. Premade or packaged taco seasonings. Relishes. Regular salad dressings. Where to find more information:  National Heart, Lung, and [place]: https://wilson-eaton.com/  American Heart Association: www.heart.org Summary  The DASH eating plan is a healthy eating plan that has been shown to reduce high blood pressure (hypertension). It may also reduce your risk for type 2 diabetes, heart disease, and stroke.  With the DASH eating plan, you should limit salt (sodium) intake to 2,300 mg a day. If you have hypertension, you may need to reduce your sodium intake to 1,500 mg a day.  When on the DASH eating plan, aim to eat more fresh fruits and vegetables, whole grains, lean proteins, low-fat dairy, and heart-healthy fats.  Work with your health care provider or diet and nutrition specialist (dietitian) to adjust your eating plan to your individual calorie needs. This information is not intended to replace advice given to you by your health care provider. Make sure you discuss any questions you have with your health care provider. Document Released: 09/05/2011 Document Revised: 09/09/2016 Document Reviewed: 09/09/2016 Elsevier Interactive Patient Education  2019 [employer].

## 2019-02-24 NOTE — Progress Notes (Signed)
Anesthesia Chart Review   Case:  [id number] Date/Time:  03/15/19 0730   Procedure:  RADIOACTIVE SEED IMPLANT/BRACHYTHERAPY IMPLANT (N/A )   Anesthesia type:  General   Pre-op diagnosis:  PROSTATE CANCER   Location:  [facility] OR ROOM 1 / [place]   Surgeon:  Kathie Rhodes, MD      DISCUSSION: 73 yo former smoker (5 pack years, quit 01/09/01) with h/o depression, GERD, TIA 2015, memory loss, prostate cancer scheduled for above procedure 03/15/2019 with Dr. Kathie Rhodes.   Cardiac clearance previously received 09/02/18 which states, "Joseph Foster was last seen on 06/23/18 by Dr.Nahser.  Since that day, Joseph Foster has done well. He denies anginal symptoms. No CP or dyspnea. He can walk up a flight of stairs >4 METS of physical activity w/o exertional symptoms.  Therefore, based on ACC/AHA guidelines, the patient would be at acceptable risk for the planned procedure without further cardiovascular testing. Ok to hold ASA 5-7 days prior to surgery if needed. Resume as soon as safe to do so from a surgical standpoint."  Pt last seen by cardiology 02/24/2019.  Seen by Daune Perch, NP.  She again states, "He can proceed with the seed implant and knee surgery without any further cardiac testing."  Pt can proceed with planned procedure barring acute status change.    VS: There were no vitals taken for this visit.  PROVIDERS: Hulan Fess, MD is PCP   Acie Fredrickson, MD is Cardiologist  LABS: Pending labs (all labs ordered are listed, but only abnormal results are displayed)  Labs Reviewed - No data to display   IMAGES: Chest Xray 02/19/19 FINDINGS: The heart size and mediastinal contours are within normal limits. Both lungs are clear. Disc degenerative disease of the thoracic spine.  IMPRESSION: No acute abnormality of the lungs.  Carotid Doppler 08/23/14 Summary: Bilateral 1% to 39% ICA stenosis lower end of range. Vertebral artery flow is  antegrade.  EKG: 02/19/2019 Rate 86 bpm Sinus rhythm with occasional Premature ventricular complexes Left axis deviation Left anterior fasicular block  CV: Echo 05/12/18 Study Conclusions  - Left ventricle: The cavity size was mildly dilated. Wall   thickness was normal. Systolic function was normal. The estimated   ejection fraction was in the range of 50% to 55%. Wall motion was   normal; there were no regional wall motion abnormalities. Doppler   parameters are consistent with abnormal left ventricular   relaxation (grade 1 diastolic dysfunction). - Aortic root: The aortic root was mildly dilated.  Impressions:  - Normal LV systolic function; mild LVE; mildly dilated aortic   root.  Cardiac Cath 04/29/16  Ost LAD lesion, 20 %stenosed - with a moderate amount of calcification. Otherwise angiographically normal CAD in the right dominant system.  The left ventricular systolic function is normal. Ischemic Symptoms? CCS III (Marked limitation of ordinary activity)  LV end diastolic pressure is mildly elevated    No angiographic evidence of any CAD to explain the patient's abnormal Myoview. I suspect that this is likely gut attenuation. Also noted that the left ventricle function is if anything low normal.  Mildly elevated LVEDP after precath hydration.  Stress Test 04/23/16  Nuclear stress EF: 47%.  There was no ST segment deviation noted during stress.  No T wave inversion was noted during stress.  Defect 1: There is a small defect of moderate severity present in the basal inferoseptal and mid inferoseptal location. Consistent with infarct.  The left ventricular ejection fraction is  mildly decreased (45-54%).  No ischemia is noted. Past Medical History:  Diagnosis Date  . Acute medial meniscus tear of left knee   . Arthritis KNEES  . Chronic back pain   . DDD (degenerative disc disease), lumbar L4  . Depression   . Depression   . GERD (gastroesophageal  reflux disease)   . Insomnia   . Memory loss   . Prostate cancer ([place])   . Ringing in ears   . Stroke [facility])    ? TIA   . TIA (transient ischemic attack) 2015    Past Surgical History:  Procedure Laterality Date  . CARDIAC CATHETERIZATION N/A 04/29/2016   Procedure: Left Heart Cath and Coronary Angiography;  Surgeon: Leonie Man, MD;  Location: [facility];  Service: Cardiovascular;  Laterality: N/A;  . HC EEG 41-60MINS  08/23/2014      . KNEE ARTHROSCOPY  01/15/2012   Procedure: ARTHROSCOPY KNEE;  Surgeon: Gearlean Alf, MD;  Location: [facility];  Service: Orthopedics;  Laterality: Left;  meniscal debridement  . RIGHT KNEE SURG.  2005  . SPINAL CORD STIMULATOR IMPLANT  2009  . TONSILECTOMY/ADENOIDECTOMY WITH MYRINGOTOMY    . TOTAL KNEE ARTHROPLASTY Right 12/26/2014   Procedure: TOTAL RIGHT  KNEE ARTHROPLASTY;  Surgeon: Gaynelle Arabian, MD;  Location: [facility];  Service: Orthopedics;  Laterality: Right;  [name] VASECTOMY      MEDICATIONS: . ALPRAZolam (XANAX) 0.5 MG tablet  . aspirin EC 81 MG tablet  . BELBUCA 75 MCG FILM  . Buprenorphine HCl 150 MCG FILM  . carvedilol (COREG) 3.125 MG tablet  . cloNIDine (CATAPRES - DOSED IN MG/24 HR) 0.2 mg/24hr patch  . doxycycline (VIBRAMYCIN) 100 MG capsule  . ENTRESTO 24-26 MG  . gabapentin (NEURONTIN) 600 MG tablet  . ipratropium (ATROVENT) 0.03 % nasal spray  . levofloxacin (LEVAQUIN) 750 MG tablet  . montelukast (SINGULAIR) 10 MG tablet  . Multiple Vitamin (MULTIVITAMIN) tablet  . PARoxetine (PAXIL) 40 MG tablet  . polyethylene glycol powder (GLYCOLAX/MIRALAX) powder  . sodium chloride (OCEAN) 0.65 % SOLN nasal spray  . tadalafil (CIALIS) 20 MG tablet  . tamsulosin (FLOMAX) 0.4 MG CAPS capsule   No current facility-administered medications for this encounter.     Maia Plan [facility] Pre-Surgical Testing [phone] 02/24/19 10:09 AM

## 2019-02-24 NOTE — Progress Notes (Signed)
Virtual Visit via Telephone Note   This visit type was conducted due to national recommendations for restrictions regarding the COVID-19 Pandemic (e.g. social distancing) in an effort to limit this patient's exposure and mitigate transmission in our community.  Due to his co-morbid illnesses, this patient is at least at moderate risk for complications without adequate follow up.  This format is felt to be most appropriate for this patient at this time.  The patient did not have access to video technology/had technical difficulties with video requiring transitioning to audio format only (telephone).  All issues noted in this document were discussed and addressed.  No physical exam could be performed with this format.  Please refer to the patient's chart for his  consent to telehealth for [facility].   Date:  02/24/2019   ID:  Joseph Foster, DOB 02/19/1946, MRN [id number]  Patient Location: Home Provider Location: Home  PCP:  Hulan Fess, MD  Cardiologist:  Mertie Moores, MD  Electrophysiologist:  None   Evaluation Performed:  Follow-Up Visit  Chief Complaint:  Follow up heart failure  History of Present Illness:    Joseph Foster is a 73 y.o. male with a past medical history significant for essential hypertension, combined systolic and diastolic heart failure, COPD, hyperlipidemia, TIA-on aspirin for TIA, anxiety and depression. He has a spinal cord stimulator for chronic pain, Lower back L4-L5 and left knee. He is now being treated for prostate cancer found in 10/2018 with brachytherapy to have seed implanted on 6/15.  He is planning for left knee replacement in late summer.   Heart catheterization on 04/29/2016 reveals mild LAD stenosis. His LV systolic function was normal. Echo in 2015-EF 45%, Echo in 04/2018 EF 55% with grade 1 DD.   Quit smoking in 2001.  Still rides a motorcycle. He is active, walks a couple miles every day. No chest pain/pressure or shortness of breath. He  has mild increase in breathing effort with climbing a steep hill. He gets brief lightheaded if stands up too fast. No syncope. No palpitations, orthopnea, PND or edema. He is having sinus problems and breathing through his nose.   He drinks 2-3 beers per day or 3 whiskey drinks.   Usual BP 120's/70's-80's   The patient does not have symptoms concerning for COVID-19 infection (fever, chills, cough, or new shortness of breath).    Past Medical History:  Diagnosis Date  . Acute medial meniscus tear of left knee   . Arthritis KNEES  . Chronic back pain   . DDD (degenerative disc disease), lumbar L4  . Depression   . Depression   . GERD (gastroesophageal reflux disease)   . Insomnia   . Memory loss   . Prostate cancer ([place])   . Ringing in ears   . Stroke [facility])    ? TIA   . TIA (transient ischemic attack) 2015   Past Surgical History:  Procedure Laterality Date  . CARDIAC CATHETERIZATION N/A 04/29/2016   Procedure: Left Heart Cath and Coronary Angiography;  Surgeon: Leonie Man, MD;  Location: [place] CV LAB;  Service: Cardiovascular;  Laterality: N/A;  . HC EEG 41-60MINS  08/23/2014      . KNEE ARTHROSCOPY  01/15/2012   Procedure: ARTHROSCOPY KNEE;  Surgeon: Gearlean Alf, MD;  Location: [facility];  Service: Orthopedics;  Laterality: Left;  meniscal debridement  . RIGHT KNEE SURG.  2005  . SPINAL CORD STIMULATOR IMPLANT  2009  . TONSILECTOMY/ADENOIDECTOMY WITH MYRINGOTOMY    .  TOTAL KNEE ARTHROPLASTY Right 12/26/2014   Procedure: TOTAL RIGHT  KNEE ARTHROPLASTY;  Surgeon: Gaynelle Arabian, MD;  Location: [facility];  Service: Orthopedics;  Laterality: Right;  [name] VASECTOMY       Current Meds  Medication Sig  . ALPRAZolam (XANAX) 0.5 MG tablet Take 0.5 mg by mouth 2 (two) times daily as needed for anxiety.   [name] aspirin EC 81 MG tablet Take 1 tablet (81 mg total) by mouth daily.  [name] BELBUCA 75 MCG FILM   . Buprenorphine HCl 150 MCG FILM Place inside cheek.  .  carvedilol (COREG) 3.125 MG tablet TAKE 1 TABLET BY MOUTH TWICE A DAY  . cloNIDine (CATAPRES - DOSED IN MG/24 HR) 0.2 mg/24hr patch Place onto the skin.  [name] doxycycline (VIBRAMYCIN) 100 MG capsule Take 1 capsule (100 mg total) by mouth 2 (two) times daily. One po bid x 7 days  . ENTRESTO 24-26 MG TAKE 1 TABLET BY MOUTH TWICE A DAY  . gabapentin (NEURONTIN) 600 MG tablet Take 600 mg by mouth 3 (three) times daily.  [name] ipratropium (ATROVENT) 0.03 % nasal spray SPRAY 2 SPRAYS INTO EACH NOSTRIL TWICE A DAY  . levofloxacin (LEVAQUIN) 750 MG tablet TAKE 1 TABLET BY MOUTH THE MORNING OF PROSTATE BIOPSY  . montelukast (SINGULAIR) 10 MG tablet   . Multiple Vitamin (MULTIVITAMIN) tablet Take 1 tablet by mouth daily.  [name] PARoxetine (PAXIL) 40 MG tablet Take 40 mg by mouth every morning.  . polyethylene glycol powder (GLYCOLAX/MIRALAX) powder Take 17 g by mouth at bedtime.  . sodium chloride (OCEAN) 0.65 % SOLN nasal spray Place 1 spray into both nostrils as needed for congestion.  . tadalafil (CIALIS) 20 MG tablet Take 10 mg by mouth daily as needed for erectile dysfunction.  . tamsulosin (FLOMAX) 0.4 MG CAPS capsule Take 0.4 mg by mouth daily.     Allergies:   Codeine; Dilaudid [hydromorphone hcl]; Penicillins; Simvastatin; Ceclor [cefaclor]; Oxycontin [oxycodone hcl]; and Sulfa antibiotics   Social History   Tobacco Use  . Smoking status: Former Smoker    Packs/day: 0.25    Years: 20.00    Pack years: 5.00    Types: Cigars    Last attempt to quit: 01/09/2001    Years since quitting: 18.1  . Smokeless tobacco: Current User    Types: Snuff  Substance Use Topics  . Alcohol use: Yes    Alcohol/week: 21.0 standard drinks    Types: 7 Cans of beer, 14 Shots of liquor per week    Comment: 02/09/15 beer in summer, liquor in winter  . Drug use: No     Family Hx: The patient's family history includes Arthritis in his mother; Heart disease in his father; Hyperlipidemia in his brother and mother;  Leukemia in his mother; Lung cancer in his father.  ROS:   Please see the history of present illness.     All other systems reviewed and are negative.   Prior CV studies:   The following studies were reviewed today:  Echocardiogram 05/12/18 Study Conclusions - Left ventricle: The cavity size was mildly dilated. Wall   thickness was normal. Systolic function was normal. The estimated   ejection fraction was in the range of 50% to 55%. Wall motion was   normal; there were no regional wall motion abnormalities. Doppler   parameters are consistent with abnormal left ventricular   relaxation (grade 1 diastolic dysfunction). - Aortic root: The aortic root was mildly dilated.  Impressions: - Normal LV systolic function; mild  LVE; mildly dilated aortic   root.  LHC 04/29/2016  Ost LAD lesion, 20 %stenosed - with a moderate amount of calcification. Otherwise angiographically normal CAD in the right dominant system.  The left ventricular systolic function is normal. Ischemic Symptoms? CCS III (Marked limitation of ordinary activity)  LV end diastolic pressure is mildly elevated No angiographic evidence of any CAD to explain the patient's abnormal Myoview. I suspect that this is likely gut attenuation. Also noted that the left ventricle function is if anything low normal.  Mildly elevated LVEDP after precath hydration.  Plan: Standard post radial cath care with TR band removal. Anticipate discharge today after bed rest. Follow-up with Dr. Acie Fredrickson.  Labs/Other Tests and Data Reviewed:    EKG:  An ECG dated 02/19/19 was personally reviewed today and demonstrated:  SR with PVC, LAD, LAFB, 86 bpm  Recent Labs: 06/23/2018: BUN 25; Creatinine, Ser 1.20; Potassium 4.7; Sodium 139   Recent Lipid Panel Lab Results  Component Value Date/Time   CHOL 165 08/23/2014 06:18 AM   TRIG 131 08/23/2014 06:18 AM   HDL 51 08/23/2014 06:18 AM   CHOLHDL 3.2 08/23/2014 06:18 AM   LDLCALC 88  08/23/2014 06:18 AM    Wt Readings from Last 3 Encounters:  02/24/19 257 lb (116.6 kg)  02/19/19 257 lb 9.6 oz (116.8 kg)  02/19/19 257 lb 9.6 oz (116.8 kg)     Objective:    Vital Signs:  Ht 6\' 2"  (1.88 m)   Wt 257 lb (116.6 kg)   BMI 33.00 kg/m    VITAL SIGNS:  reviewed GEN:  no acute distress RESPIRATORY:  No audible wheezing or cough. Able to speak in complete sentences.  NEURO:  alert and oriented x 3, no obvious focal deficit PSYCH:  normal affect  ASSESSMENT & PLAN:    1. Chronic systolic and diastolic heart failure -Echo in 2015-EF 45%, Echo in 04/2018 EF 55% with grade 1 DD. Only mild non-obst CAD on cath.  -Medical therapy includes Entresto and carvedilol.   2. Hypertension -On Entresto, low dose carvedilol, clonidine patch -Last labs in 05/2018 with normal renal function and potassium. -Pt to have labs done later this month for his surgery.  3. Chronic pain  -Has spinal cord stimulator for chronic back pain.  -Plan for knee surgery in late summer.   4. Malignant neoplasm of prostate -Diagnosed in 10/2018. Planned to initiate brachytherapy. Seed implant scheduled for 03/15/19.   5.  History of TIA -Pt thinks that his symptoms at the time, numbness in foot, were actually related to a pinched nerve in his back. Improved with Neurontin.  -He has stopped taking aspirin on his own. Advised to check with PCP to make sure OK with them.   6.  Pre-procedure clearance -Pt is planning for seed implant for prostate cancer on 6/15 and plans for knee surgery in late summer. -He has history of chronic systolic HF which is well compensated on current meds with normalization of EF by last echo.  -Pt is active without any symptoms. He can complete greater than 4 Mets of activity.  -Due to prior hx of HF and ?TIA ANOTHY BUFANO Is a class I-III risk with 0.9-6.9% risk of major cardiac event perioperatively (depending on whether he actually had TIA), although he is well  compensated on appropriate medical therapy.  -He can proceed with the seed implant and knee surgery without any further cardiac testing.  -Aspirin was being given for hx of TIA with no  cardiac indication, it is OK to hold aspirin for surgery from our perspective. He should check with his PCP for further input.    COVID-19 Education: The signs and symptoms of COVID-19 were discussed with the patient and how to seek care for testing (follow up with PCP or arrange E-visit).  The importance of social distancing was discussed today.  Time:   Today, I have spent 15 minutes with the patient with telehealth technology discussing the above problems.     Medication Adjustments/Labs and Tests Ordered: Current medicines are reviewed at length with the patient today.  Concerns regarding medicines are outlined above.   Tests Ordered: No orders of the defined types were placed in this encounter.   Medication Changes: No orders of the defined types were placed in this encounter.   Disposition:  Follow up in 6 month(s)  Signed, Daune Perch, NP  02/24/2019 11:17 AM    [place]

## 2019-03-03 ENCOUNTER — Other Ambulatory Visit: Payer: Self-pay | Admitting: Cardiovascular Disease

## 2019-03-05 ENCOUNTER — Telehealth: Payer: Self-pay | Admitting: *Deleted

## 2019-03-05 NOTE — Telephone Encounter (Signed)
CALLED PATIENT TO REMIND OF LAB APPT. FOR 03-11-19 - ARRIVAL TIME- 1:45 PM @ [facility] ADMITTING, SPOKE WITH PATIENT AND HE IS AWARE OF THIS APPT.

## 2019-03-08 ENCOUNTER — Encounter (HOSPITAL_COMMUNITY): Payer: BLUE CROSS/BLUE SHIELD

## 2019-03-10 ENCOUNTER — Encounter (HOSPITAL_BASED_OUTPATIENT_CLINIC_OR_DEPARTMENT_OTHER): Payer: Self-pay

## 2019-03-10 ENCOUNTER — Other Ambulatory Visit: Payer: Self-pay

## 2019-03-10 NOTE — Progress Notes (Signed)
SPOKE W/  Gianmarco     SCREENING SYMPTOMS OF COVID 19:   COUGH-- NO  RUNNY NOSE---NO   SORE THROAT---NO  NASAL CONGESTION----NO  SNEEZING----NO  SHORTNESS OF BREATH---NO  DIFFICULTY BREATHING---NO  TEMP >100.0 -----NO  UNEXPLAINED BODY ACHES------NO  CHILLS -------- NO  HEADACHES ---------NO  LOSS OF SMELL/ TASTE --------NO    HAVE YOU OR ANY FAMILY MEMBER TRAVELLED PAST 14 DAYS OUT OF THE   COUNTY---NO STATE----NO COUNTRY----NO  HAVE YOU OR ANY FAMILY MEMBER BEEN EXPOSED TO ANYONE WITH COVID 19? NO    

## 2019-03-10 NOTE — Progress Notes (Signed)
Cardiac clearance 02/23/2021 in chart/epic Spoke with:  Emmerson NPO:  After Midnight, no gum, candy, or mints   Arrival time:  0530AM Labs: (EKG, CXR, CBC, CMP, PT, PTT, COVID in epic) AM medications: Carvedilol, Gabapentin, Paroxetine, Tamsulosin, Nexium, Nasal Spray, Fleet Enema  Pre op orders: Yes Ride home:  Karmen Bongo (friend) [phone]

## 2019-03-11 ENCOUNTER — Other Ambulatory Visit (HOSPITAL_COMMUNITY)
Admission: RE | Admit: 2019-03-11 | Discharge: 2019-03-11 | Disposition: A | Discharge status: Home / Self Care | Payer: BC Managed Care – PPO | Source: Ambulatory Visit | Attending: Urology | Admitting: Urology

## 2019-03-11 ENCOUNTER — Encounter (HOSPITAL_COMMUNITY)
Admission: RE | Admit: 2019-03-11 | Discharge: 2019-03-11 | Disposition: A | Discharge status: Home / Self Care | Payer: BLUE CROSS/BLUE SHIELD | Source: Ambulatory Visit | Attending: Urology | Admitting: Urology

## 2019-03-11 DIAGNOSIS — Z1159 Encounter for screening for other viral diseases: Secondary | ICD-10-CM | POA: Insufficient documentation

## 2019-03-11 DIAGNOSIS — E7800 Pure hypercholesterolemia, unspecified: Secondary | ICD-10-CM | POA: Diagnosis not present

## 2019-03-11 DIAGNOSIS — I110 Hypertensive heart disease with heart failure: Secondary | ICD-10-CM | POA: Diagnosis not present

## 2019-03-11 DIAGNOSIS — I509 Heart failure, unspecified: Secondary | ICD-10-CM | POA: Diagnosis not present

## 2019-03-11 DIAGNOSIS — Z87891 Personal history of nicotine dependence: Secondary | ICD-10-CM | POA: Diagnosis not present

## 2019-03-11 DIAGNOSIS — Z882 Allergy status to sulfonamides status: Secondary | ICD-10-CM | POA: Diagnosis not present

## 2019-03-11 DIAGNOSIS — Z79899 Other long term (current) drug therapy: Secondary | ICD-10-CM | POA: Diagnosis not present

## 2019-03-11 DIAGNOSIS — Z8673 Personal history of transient ischemic attack (TIA), and cerebral infarction without residual deficits: Secondary | ICD-10-CM | POA: Diagnosis not present

## 2019-03-11 DIAGNOSIS — F419 Anxiety disorder, unspecified: Secondary | ICD-10-CM | POA: Diagnosis not present

## 2019-03-11 DIAGNOSIS — K219 Gastro-esophageal reflux disease without esophagitis: Secondary | ICD-10-CM | POA: Diagnosis not present

## 2019-03-11 DIAGNOSIS — C61 Malignant neoplasm of prostate: Secondary | ICD-10-CM | POA: Diagnosis present

## 2019-03-11 DIAGNOSIS — Z885 Allergy status to narcotic agent status: Secondary | ICD-10-CM | POA: Diagnosis not present

## 2019-03-11 DIAGNOSIS — H409 Unspecified glaucoma: Secondary | ICD-10-CM | POA: Diagnosis not present

## 2019-03-11 LAB — PROTIME-INR
INR: 1 (ref 0.8–1.2)
Prothrombin Time: 13.2 seconds (ref 11.4–15.2)

## 2019-03-11 LAB — COMPREHENSIVE METABOLIC PANEL
ALT: 28 U/L (ref 0–44)
AST: 24 U/L (ref 15–41)
Albumin: 4.4 g/dL (ref 3.5–5.0)
Alkaline Phosphatase: 28 U/L — ABNORMAL LOW (ref 38–126)
Anion gap: 9 (ref 5–15)
BUN: 19 mg/dL (ref 8–23)
CO2: 25 mmol/L (ref 22–32)
Calcium: 9.3 mg/dL (ref 8.9–10.3)
Chloride: 104 mmol/L (ref 98–111)
Creatinine, Ser: 1.09 mg/dL (ref 0.61–1.24)
GFR calc Af Amer: >60 mL/min (ref >60)
GFR calc non Af Amer: >60 mL/min (ref >60)
Glucose, Bld: 99 mg/dL (ref 70–99)
Potassium: 4.2 mmol/L (ref 3.5–5.1)
Sodium: 138 mmol/L (ref 135–145)
Total Bilirubin: 0.5 mg/dL (ref 0.3–1.2)
Total Protein: 7.8 g/dL (ref 6.5–8.1)

## 2019-03-11 LAB — CBC
HCT: 44.5 % (ref 39.0–52.0)
Hemoglobin: 14.9 g/dL (ref 13.0–17.0)
MCH: 32 pg (ref 26.0–34.0)
MCHC: 33.5 g/dL (ref 30.0–36.0)
MCV: 95.5 fL (ref 80.0–100.0)
Platelets: 219 10*3/uL (ref 150–400)
RBC: 4.66 MIL/uL (ref 4.22–5.81)
RDW: 13.1 % (ref 11.5–15.5)
WBC: 8 10*3/uL (ref 4.0–10.5)
nRBC: 0 % (ref 0.0–0.2)

## 2019-03-11 LAB — APTT: aPTT: 41 seconds — ABNORMAL HIGH (ref 24–36)

## 2019-03-11 NOTE — H&P (Signed)
HPI: Joseph Foster is a 73 year-old male with prostate cancer.  His prostate cancer was diagnosed 12/30/2018. His PSA at his time of diagnosis was 2.93. His cancer was T1c, 3+4 (1 core).   Adenocarcinoma of the prostate: He was noted to have a firm right lobe on DRE with a PSA in 2/20 of 2.93.  TRUS/Bx 12/30/18: Prostate volume - 38 cc  Pathology: Adenocarcinoma 3+4 from right apex lateral, total cores positive 1/12.  Risk group: Low - favorable intermediate.  Stage: T1c     ALLERGIES: Codeine Derivatives hydromorphone Sulfa Drugs    MEDICATIONS: Alprazolam 0.5 mg tablet Oral  Ferrous Sulfate 325 mg (65 mg iron) tablet Oral  Levaquin 750 mg tablet 1 tablet PO Daily Take the morning of your prostate biopsy.  Miralax 17 gram/dose powder Oral  Morphine Sulfate Er 30 mg capsule, extended release pellets Oral  Morphine Sulfate ER 15 MG TB12 Oral  Paxil 40 mg tablet Oral  Simvastatin 20 MG Oral Tablet Oral  Tamsulosin Hcl 0.4 mg capsule Oral     GU PSH: Prostate Needle Biopsy - 12/30/2018      PSH Notes: Arthroscopy Knee Right   NON-GU PSH: Knee Arthroscopy; Dx - 2016 Surgical Pathology, Gross And Microscopic Examination For Prostate Needle - 12/30/2018    GU PMH: Prostate nodule w/o LUTS, Right, He has an abnormal DRE and therefore is going to undergo TRUS/Bx. - 12/07/2018 Urinary Retention, Unspec, Urinary retention - 2016    NON-GU PMH: Personal history of transient ischemic attack (TIA), and cerebral infarction without residual deficits, History of transient cerebral ischemia - 2016 Anxiety Arthritis Congestive heart failure Encounter for general adult medical examination without abnormal findings, Encounter for preventive health examination GERD Glaucoma Hypercholesterolemia Hypertension Stroke/TIA    FAMILY HISTORY: Deceased - Runs In Family   SOCIAL HISTORY: Marital Status: Married Preferred Language: English; Ethnicity: Not Hispanic Or Latino; Race: White Current  Smoking Status: Patient does not smoke anymore.   Tobacco Use Assessment Completed: Used Tobacco in last 30 days? Has never drank.  Does not drink caffeine.     Notes: Smoker, current status unknown, Married, Smokes cigarettes, Caffeine use, Alcohol use, Occupation, Number of children, Tobacco use   REVIEW OF SYSTEMS:    GU Review Male:   Patient denies frequent urination, hard to postpone urination, burning/ pain with urination, get up at night to urinate, leakage of urine, stream starts and stops, trouble starting your stream, have to strain to urinate , erection problems, and penile pain.  Gastrointestinal (Upper):   Patient denies nausea, vomiting, and indigestion/ heartburn.  Gastrointestinal (Lower):   Patient denies constipation and diarrhea.  Constitutional:   Patient denies fever, night sweats, weight loss, and fatigue.  Skin:   Patient denies skin rash/ lesion and itching.  Eyes:   Patient denies blurred vision and double vision.  Ears/ Nose/ Throat:   Patient denies sore throat and sinus problems.  Hematologic/Lymphatic:   Patient denies swollen glands and easy bruising.  Cardiovascular:   Patient denies leg swelling and chest pains.  Respiratory:   Patient denies cough and shortness of breath.  Endocrine:   Patient denies excessive thirst.  Musculoskeletal:   Patient denies back pain and joint pain.  Neurological:   Patient denies headaches and dizziness.  Psychologic:   Patient denies depression and anxiety.   VITAL SIGNS:    Weight 260 lb / 117.93 kg  Height 74 in / 187.96 cm  BP 130/82 mmHg  Pulse 86 /min  BMI  33.4 kg/m   GU PHYSICAL EXAMINATION:    Anus and Perineum: No hemorrhoids. No anal stenosis. No rectal fissure, no anal fissure. No edema, no dimple, no perineal tenderness, no anal tenderness.  Scrotum: No lesions. No edema. No cysts. No warts.  Epididymides: Right: no spermatocele, no masses, no cysts, no tenderness, no induration, no enlargement. Left: no  spermatocele, no masses, no cysts, no tenderness, no induration, no enlargement.  Testes: No tenderness, no swelling, no enlargement left testes. No tenderness, no swelling, no enlargement right testes. Normal location left testes. Normal location right testes. No mass, no cyst, no varicocele, no hydrocele left testes. No mass, no cyst, no varicocele, no hydrocele right testes.  Urethral Meatus: Normal size. No lesion, no wart, no discharge, no polyp. Normal location.  Penis: Circumcised, no warts, no cracks. No dorsal Peyronie's plaques, no left corporal Peyronie's plaques, no right corporal Peyronie's plaques, no scarring, no warts. No balanitis, no meatal stenosis.  Prostate: Prostate 1 1/2+ size. Right lobe firm. Left lobe normal consistency. Symmetrical lobes. No prostate nodule. Left lobe no tenderness, right lobe no tenderness.   Seminal Vesicles: Nonpalpable.  Sphincter Tone: Normal sphincter. No rectal tenderness. No rectal mass.    MULTI-SYSTEM PHYSICAL EXAMINATION:    Constitutional: Well-nourished. No physical deformities. Normally developed. Good grooming.  Neck: Neck symmetrical, not swollen. Normal tracheal position.  Respiratory: No labored breathing, no use of accessory muscles.   Cardiovascular: Normal temperature, normal extremity pulses, no swelling, no varicosities.  Lymphatic: No enlargement of neck, axillae, groin.  Skin: No paleness, no jaundice, no cyanosis. No lesion, no ulcer, no rash.  Neurologic / Psychiatric: Oriented to time, oriented to place, oriented to person. No depression, no anxiety, no agitation.  Gastrointestinal: No mass, no tenderness, no rigidity, non obese abdomen.  Eyes: Normal conjunctivae. Normal eyelids.  Ears, Nose, Mouth, and Throat: Left ear no scars, no lesions, no masses. Right ear no scars, no lesions, no masses. Nose no scars, no lesions, no masses. Normal hearing. Normal lips.  Musculoskeletal: Normal gait and station of head and neck.     PAST DATA REVIEWED:  Source Of History:  Patient  Lab Test Review:   Path Report  Records Review:   Previous Patient Records   11/25/18 05/17/05 09/27/03  PSA  Total PSA 2.93 ng/dl 1.20  0.57     05/17/05  Hormones  Testosterone, Total 2.48      ASSESSMENT/PLAN:     ICD-10 Details  1 GU:   Prostate Cancer - I went over his pathology report with him today as well as the significance of his Gleason score, number and location of cores positive and percent of cores positive. We then discussed his Partin table results in detail and the significance of these predictions as far as prognosis and need for further workup are concerned. I then discussed with him the various options available including active surveillance and treatment for cure such as radiation and surgery. We briefly discussed the forms of radiation available.  After discussing all of the options available for management of his prostate cancer he indicated to me that he would like to proceed with treatment and would choose to undergo radioactive seed implantation. He would be an appropriate candidate with a prostate volume of 38 cc.

## 2019-03-12 ENCOUNTER — Telehealth: Payer: Self-pay | Admitting: *Deleted

## 2019-03-12 LAB — NOVEL CORONAVIRUS, NAA (HOSP ORDER, SEND-OUT TO REF LAB; TAT 18-24 HRS): SARS-CoV-2, NAA: NOT DETECTED

## 2019-03-12 NOTE — Progress Notes (Signed)
SPOKE W/ PATIENT.    SCREENING SYMPTOMS OF COVID 19:   COUGH-- NO  RUNNY NOSE--- NO  SORE THROAT--- NO  NASAL CONGESTION---- NO  SNEEZING---- NO  SHORTNESS OF BREATH--- NO  DIFFICULTY BREATHING--- NO  TEMP >100.0 ----- NO  UNEXPLAINED BODY ACHES------ NO  CHILLS -------- NO  HEADACHES --------- NO  LOSS OF SMELL/ TASTE -------- NO    HAVE YOU OR ANY FAMILY MEMBER TRAVELLED PAST 14 DAYS OUT OF THE   COUNTY--- NO STATE---- NO COUNTRY---- NO  HAVE YOU OR ANY FAMILY MEMBER BEEN EXPOSED TO ANYONE WITH COVID 19? NO  Dorrene German, RN

## 2019-03-12 NOTE — Telephone Encounter (Signed)
CALLED PATIENT TO REMIND OF PROCEDURE FOR 03-15-19, SPOKE WITH PATIENT AND HE IS AWARE OF THIS PROCEDURE

## 2019-03-15 ENCOUNTER — Ambulatory Visit (HOSPITAL_BASED_OUTPATIENT_CLINIC_OR_DEPARTMENT_OTHER)
Admission: RE | Admit: 2019-03-15 | Discharge: 2019-03-15 | Disposition: A | Discharge status: Home / Self Care | Payer: BC Managed Care – PPO | Source: Other Acute Inpatient Hospital | Attending: Urology | Admitting: Urology

## 2019-03-15 ENCOUNTER — Ambulatory Visit (HOSPITAL_COMMUNITY): Payer: BC Managed Care – PPO

## 2019-03-15 ENCOUNTER — Encounter (HOSPITAL_BASED_OUTPATIENT_CLINIC_OR_DEPARTMENT_OTHER): Payer: Self-pay

## 2019-03-15 ENCOUNTER — Ambulatory Visit (HOSPITAL_BASED_OUTPATIENT_CLINIC_OR_DEPARTMENT_OTHER): Payer: BC Managed Care – PPO | Admitting: Certified Registered"

## 2019-03-15 ENCOUNTER — Encounter (HOSPITAL_BASED_OUTPATIENT_CLINIC_OR_DEPARTMENT_OTHER)
Admission: RE | Disposition: A | Discharge status: Home / Self Care | Payer: Self-pay | Source: Other Acute Inpatient Hospital | Attending: Urology

## 2019-03-15 ENCOUNTER — Ambulatory Visit (HOSPITAL_BASED_OUTPATIENT_CLINIC_OR_DEPARTMENT_OTHER): Payer: BC Managed Care – PPO | Admitting: Physician Assistant

## 2019-03-15 DIAGNOSIS — Z79899 Other long term (current) drug therapy: Secondary | ICD-10-CM | POA: Insufficient documentation

## 2019-03-15 DIAGNOSIS — F419 Anxiety disorder, unspecified: Secondary | ICD-10-CM | POA: Insufficient documentation

## 2019-03-15 DIAGNOSIS — H409 Unspecified glaucoma: Secondary | ICD-10-CM | POA: Insufficient documentation

## 2019-03-15 DIAGNOSIS — Z885 Allergy status to narcotic agent status: Secondary | ICD-10-CM | POA: Insufficient documentation

## 2019-03-15 DIAGNOSIS — I110 Hypertensive heart disease with heart failure: Secondary | ICD-10-CM | POA: Insufficient documentation

## 2019-03-15 DIAGNOSIS — Z87891 Personal history of nicotine dependence: Secondary | ICD-10-CM | POA: Insufficient documentation

## 2019-03-15 DIAGNOSIS — C61 Malignant neoplasm of prostate: Secondary | ICD-10-CM | POA: Insufficient documentation

## 2019-03-15 DIAGNOSIS — Z8673 Personal history of transient ischemic attack (TIA), and cerebral infarction without residual deficits: Secondary | ICD-10-CM | POA: Insufficient documentation

## 2019-03-15 DIAGNOSIS — Z882 Allergy status to sulfonamides status: Secondary | ICD-10-CM | POA: Insufficient documentation

## 2019-03-15 DIAGNOSIS — E7800 Pure hypercholesterolemia, unspecified: Secondary | ICD-10-CM | POA: Insufficient documentation

## 2019-03-15 DIAGNOSIS — I509 Heart failure, unspecified: Secondary | ICD-10-CM | POA: Insufficient documentation

## 2019-03-15 DIAGNOSIS — K219 Gastro-esophageal reflux disease without esophagitis: Secondary | ICD-10-CM | POA: Insufficient documentation

## 2019-03-15 HISTORY — DX: Paresthesia of skin: R20.2

## 2019-03-15 HISTORY — PX: CYSTOSCOPY: SHX5120

## 2019-03-15 HISTORY — DX: Anxiety disorder, unspecified: F41.9

## 2019-03-15 HISTORY — DX: Pneumonia, unspecified organism: J18.9

## 2019-03-15 HISTORY — PX: RADIOACTIVE SEED IMPLANT: SHX5150

## 2019-03-15 HISTORY — DX: Hyperlipidemia, unspecified: E78.5

## 2019-03-15 HISTORY — DX: Chronic combined systolic (congestive) and diastolic (congestive) heart failure: I50.42

## 2019-03-15 HISTORY — DX: Chronic obstructive pulmonary disease, unspecified: J44.9

## 2019-03-15 HISTORY — DX: Anesthesia of skin: R20.0

## 2019-03-15 HISTORY — DX: Essential (primary) hypertension: I10

## 2019-03-15 SURGERY — INSERTION, RADIATION SOURCE, PROSTATE
Anesthesia: General | Site: Prostate

## 2019-03-15 MED ORDER — FENTANYL CITRATE (PF) 100 MCG/2ML IJ SOLN
25.0000 ug | INTRAMUSCULAR | Status: DC | PRN
  Administered 2019-03-15 (×3): 25 ug via INTRAVENOUS
  Filled 2019-03-15: qty 1

## 2019-03-15 MED ORDER — LACTATED RINGERS IV SOLN
INTRAVENOUS | Status: DC
  Administered 2019-03-15 (×2): via INTRAVENOUS
  Filled 2019-03-15: qty 1000

## 2019-03-15 MED ORDER — ONDANSETRON HCL 4 MG/2ML IJ SOLN
INTRAMUSCULAR | Status: DC | PRN
  Administered 2019-03-15: 4 mg via INTRAVENOUS

## 2019-03-15 MED ORDER — IOHEXOL 300 MG/ML  SOLN
INTRAMUSCULAR | Status: DC | PRN
  Administered 2019-03-15: 7 mL

## 2019-03-15 MED ORDER — SUCCINYLCHOLINE CHLORIDE 200 MG/10ML IV SOSY
PREFILLED_SYRINGE | INTRAVENOUS | Status: AC
  Filled 2019-03-15: qty 10

## 2019-03-15 MED ORDER — ONDANSETRON HCL 4 MG/2ML IJ SOLN
INTRAMUSCULAR | Status: AC
  Filled 2019-03-15: qty 2

## 2019-03-15 MED ORDER — MIDAZOLAM HCL 5 MG/5ML IJ SOLN
INTRAMUSCULAR | Status: DC | PRN
  Administered 2019-03-15: 2 mg via INTRAVENOUS

## 2019-03-15 MED ORDER — FENTANYL CITRATE (PF) 100 MCG/2ML IJ SOLN
INTRAMUSCULAR | Status: AC
  Filled 2019-03-15: qty 2

## 2019-03-15 MED ORDER — CIPROFLOXACIN IN D5W 400 MG/200ML IV SOLN
INTRAVENOUS | Status: AC
  Filled 2019-03-15: qty 200

## 2019-03-15 MED ORDER — ONDANSETRON HCL 4 MG/2ML IJ SOLN
4.0000 mg | Freq: Four times a day (QID) | INTRAMUSCULAR | Status: DC | PRN
  Filled 2019-03-15: qty 2

## 2019-03-15 MED ORDER — SODIUM CHLORIDE 0.9 % IR SOLN
Status: DC | PRN
  Administered 2019-03-15: 3000 mL via INTRAVESICAL

## 2019-03-15 MED ORDER — DEXAMETHASONE SODIUM PHOSPHATE 10 MG/ML IJ SOLN
INTRAMUSCULAR | Status: AC
  Filled 2019-03-15: qty 1

## 2019-03-15 MED ORDER — TRAMADOL HCL 50 MG PO TABS
50.0000 mg | ORAL_TABLET | Freq: Once | ORAL | Status: AC
  Administered 2019-03-15: 50 mg via ORAL
  Filled 2019-03-15: qty 1

## 2019-03-15 MED ORDER — EPHEDRINE 5 MG/ML INJ
INTRAVENOUS | Status: AC
  Filled 2019-03-15: qty 10

## 2019-03-15 MED ORDER — LIDOCAINE 2% (20 MG/ML) 5 ML SYRINGE
INTRAMUSCULAR | Status: AC
  Filled 2019-03-15: qty 5

## 2019-03-15 MED ORDER — EPHEDRINE SULFATE-NACL 50-0.9 MG/10ML-% IV SOSY
PREFILLED_SYRINGE | INTRAVENOUS | Status: DC | PRN
  Administered 2019-03-15 (×3): 10 mg via INTRAVENOUS

## 2019-03-15 MED ORDER — LIDOCAINE 2% (20 MG/ML) 5 ML SYRINGE
INTRAMUSCULAR | Status: DC | PRN
  Administered 2019-03-15: 100 mg via INTRAVENOUS

## 2019-03-15 MED ORDER — PROPOFOL 10 MG/ML IV BOLUS
INTRAVENOUS | Status: AC
  Filled 2019-03-15: qty 20

## 2019-03-15 MED ORDER — PHENAZOPYRIDINE HCL 100 MG PO TABS
ORAL_TABLET | ORAL | Status: AC
  Filled 2019-03-15: qty 2

## 2019-03-15 MED ORDER — SODIUM CHLORIDE (PF) 0.9 % IJ SOLN
INTRAMUSCULAR | Status: DC | PRN
  Administered 2019-03-15: 3 mL

## 2019-03-15 MED ORDER — FENTANYL CITRATE (PF) 100 MCG/2ML IJ SOLN
INTRAMUSCULAR | Status: DC | PRN
  Administered 2019-03-15 (×2): 25 ug via INTRAVENOUS
  Administered 2019-03-15: 50 ug via INTRAVENOUS

## 2019-03-15 MED ORDER — CIPROFLOXACIN HCL 500 MG PO TABS: 500.0000 mg | ORAL_TABLET | Freq: Two times a day (BID) | ORAL | 0 refills | Status: DC

## 2019-03-15 MED ORDER — SODIUM CHLORIDE 0.9 % IV SOLN
INTRAVENOUS | Status: AC | PRN
  Administered 2019-03-15: 1000 mL

## 2019-03-15 MED ORDER — DEXAMETHASONE SODIUM PHOSPHATE 10 MG/ML IJ SOLN
INTRAMUSCULAR | Status: DC | PRN
  Administered 2019-03-15: 10 mg via INTRAVENOUS

## 2019-03-15 MED ORDER — PROPOFOL 10 MG/ML IV BOLUS
INTRAVENOUS | Status: DC | PRN
  Administered 2019-03-15: 140 mg via INTRAVENOUS

## 2019-03-15 MED ORDER — ROCURONIUM BROMIDE 10 MG/ML (PF) SYRINGE
PREFILLED_SYRINGE | INTRAVENOUS | Status: AC
  Filled 2019-03-15: qty 10

## 2019-03-15 MED ORDER — CIPROFLOXACIN IN D5W 400 MG/200ML IV SOLN
400.0000 mg | INTRAVENOUS | Status: AC
  Administered 2019-03-15: 08:00:00 400 mg via INTRAVENOUS
  Filled 2019-03-15: qty 200

## 2019-03-15 MED ORDER — TRAMADOL HCL 50 MG PO TABS
ORAL_TABLET | ORAL | Status: AC
  Filled 2019-03-15: qty 1

## 2019-03-15 MED ORDER — PHENAZOPYRIDINE HCL 200 MG PO TABS
200.0000 mg | ORAL_TABLET | Freq: Once | ORAL | Status: AC
  Administered 2019-03-15: 200 mg via ORAL
  Filled 2019-03-15: qty 1

## 2019-03-15 MED ORDER — MIDAZOLAM HCL 2 MG/2ML IJ SOLN
INTRAMUSCULAR | Status: AC
  Filled 2019-03-15: qty 2

## 2019-03-15 MED ORDER — TRAMADOL HCL 50 MG PO TABS: 50.0000 mg | ORAL_TABLET | Freq: Four times a day (QID) | ORAL | 0 refills | Status: DC | PRN

## 2019-03-15 MED ORDER — FLEET ENEMA 7-19 GM/118ML RE ENEM
1.0000 | ENEMA | Freq: Once | RECTAL | Status: DC
  Filled 2019-03-15: qty 1

## 2019-03-15 SURGICAL SUPPLY — 42 items
BAG URINE DRAINAGE (UROLOGICAL SUPPLIES) ×4 IMPLANT
BLADE CLIPPER SENSICLIP SURGIC (BLADE) ×3 IMPLANT
CATH FOLEY 2WAY SLVR  5CC 16FR (CATHETERS) ×2
CATH FOLEY 2WAY SLVR 5CC 16FR (CATHETERS) ×2 IMPLANT
CATH ROBINSON RED A/P 16FR (CATHETERS) IMPLANT
CATH ROBINSON RED A/P 20FR (CATHETERS) ×3 IMPLANT
CLOTH BEACON ORANGE TIMEOUT ST (SAFETY) ×3 IMPLANT
CONT SPECI 4OZ STER CLIK (MISCELLANEOUS) ×6 IMPLANT
COVER BACK TABLE 60X90IN (DRAPES) ×3 IMPLANT
COVER MAYO STAND STRL (DRAPES) ×3 IMPLANT
COVER WAND RF STERILE (DRAPES) ×2 IMPLANT
DRSG TEGADERM 4X4.75 (GAUZE/BANDAGES/DRESSINGS) ×3 IMPLANT
DRSG TEGADERM 8X12 (GAUZE/BANDAGES/DRESSINGS) ×3 IMPLANT
GAUZE SPONGE 4X4 12PLY STRL LF (GAUZE/BANDAGES/DRESSINGS) ×1 IMPLANT
GLOVE BIO SURGEON STRL SZ 6 (GLOVE) IMPLANT
GLOVE BIO SURGEON STRL SZ 6.5 (GLOVE) IMPLANT
GLOVE BIO SURGEON STRL SZ7 (GLOVE) IMPLANT
GLOVE BIO SURGEON STRL SZ8 (GLOVE) ×3 IMPLANT
GLOVE BIOGEL PI IND STRL 6 (GLOVE) IMPLANT
GLOVE BIOGEL PI IND STRL 6.5 (GLOVE) IMPLANT
GLOVE BIOGEL PI IND STRL 7.0 (GLOVE) IMPLANT
GLOVE BIOGEL PI IND STRL 8 (GLOVE) IMPLANT
GLOVE BIOGEL PI INDICATOR 6 (GLOVE)
GLOVE BIOGEL PI INDICATOR 6.5 (GLOVE)
GLOVE BIOGEL PI INDICATOR 7.0 (GLOVE)
GLOVE BIOGEL PI INDICATOR 8 (GLOVE)
GLOVE ECLIPSE 8.0 STRL XLNG CF (GLOVE) ×3 IMPLANT
GOWN STRL REUS W/TWL XL LVL3 (GOWN DISPOSABLE) ×3 IMPLANT
HOLDER FOLEY CATH W/STRAP (MISCELLANEOUS) ×1 IMPLANT
I-Seed AgX100 ×81 IMPLANT
IV NS 1000ML (IV SOLUTION) ×3
IV NS 1000ML BAXH (IV SOLUTION) ×2 IMPLANT
IV NS IRRIG 3000ML ARTHROMATIC (IV SOLUTION) ×1 IMPLANT
KIT TURNOVER CYSTO (KITS) ×3 IMPLANT
MARKER SKIN DUAL TIP RULER LAB (MISCELLANEOUS) ×3 IMPLANT
PACK CYSTO (CUSTOM PROCEDURE TRAY) ×3 IMPLANT
SURGILUBE 2OZ TUBE FLIPTOP (MISCELLANEOUS) ×1 IMPLANT
SUT BONE WAX W31G (SUTURE) IMPLANT
SYR 10ML LL (SYRINGE) ×2 IMPLANT
TOWEL OR 17X26 10 PK STRL BLUE (TOWEL DISPOSABLE) ×5 IMPLANT
UNDERPAD 30X30 (UNDERPADS AND DIAPERS) ×6 IMPLANT
WATER STERILE IRR 500ML POUR (IV SOLUTION) ×3 IMPLANT

## 2019-03-15 NOTE — Transfer of Care (Signed)
Immediate Anesthesia Transfer of Care Note  Patient: Joseph Foster  Procedure(s) Performed: RADIOACTIVE SEED IMPLANT/BRACHYTHERAPY IMPLANT (N/A Prostate) CYSTOSCOPY (N/A Bladder)  Patient Location: PACU  Anesthesia Type:General  Level of Consciousness: awake, alert  and oriented  Airway & Oxygen Therapy: Patient Spontanous Breathing and Patient connected to nasal cannula oxygen  Post-op Assessment: Report given to RN and Post -op Vital signs reviewed and stable  Post vital signs: Reviewed and stable  Last Vitals:  Vitals Value Taken Time  BP 171/109 03/15/19 0915  Temp    Pulse 87 03/15/19 0916  Resp 13 03/15/19 0916  SpO2 98 % 03/15/19 0916  Vitals shown include unvalidated device data.  Last Pain:  Vitals:   03/15/19 0600  TempSrc: Oral  PainSc: 0-No pain      Patients Stated Pain Goal: 1 (82/95/62 1308)  Complications: No apparent anesthesia complications

## 2019-03-15 NOTE — Anesthesia Preprocedure Evaluation (Signed)
Anesthesia Evaluation  Patient identified by MRN, date of birth, ID band Patient awake    Reviewed: Allergy & Precautions, H&P , NPO status , Patient's Chart, lab work & pertinent test results  Airway Mallampati: II   Neck ROM: full    Dental   Pulmonary COPD, Current Smoker,    breath sounds clear to auscultation       Cardiovascular hypertension, + CAD and +CHF  + dysrhythmias  Rhythm:regular Rate:Normal  Mild aortic dilation. LAFB   Neuro/Psych PSYCHIATRIC DISORDERS Anxiety Depression    GI/Hepatic GERD  ,  Endo/Other  obese  Renal/GU      Musculoskeletal  (+) Arthritis ,   Abdominal   Peds  Hematology   Anesthesia Other Findings   Reproductive/Obstetrics                             Anesthesia Physical Anesthesia Plan  ASA: III  Anesthesia Plan: General   Post-op Pain Management:    Induction: Intravenous  PONV Risk Score and Plan: 1 and Ondansetron, Dexamethasone and Treatment may vary due to age or medical condition  Airway Management Planned: LMA  Additional Equipment:   Intra-op Plan:   Post-operative Plan: Extubation in OR  Informed Consent: I have reviewed the patients History and Physical, chart, labs and discussed the procedure including the risks, benefits and alternatives for the proposed anesthesia with the patient or authorized representative who has indicated his/her understanding and acceptance.       Plan Discussed with: CRNA, Anesthesiologist and Surgeon  Anesthesia Plan Comments:         Anesthesia Quick Evaluation

## 2019-03-15 NOTE — Discharge Instructions (Signed)

## 2019-03-15 NOTE — Op Note (Signed)
PATIENT:  Joseph Foster  PRE-OPERATIVE DIAGNOSIS:  Adenocarcinoma of the prostate  POST-OPERATIVE DIAGNOSIS:  Same  PROCEDURE:  1. I-125 radioactive seed implantation 2. Cystoscopy  3. Placement of SpaceOAR 4. Fluoroscopy use with time less than 1 hour.  SURGEON:  Surgeon(s): Claybon Jabs  Radiation oncologist: Dr. 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  Description of procedure: After informed consent the patient was brought to the major OR, placed on the table and administered general anesthesia. He was then moved to the modified lithotomy position with his perineum perpendicular to the floor. His perineum and genitalia were then sterilely prepped. An official timeout was then performed. A 16 French Foley catheter was then placed in the bladder and filled with dilute contrast, a rectal tube was placed in the rectum and the transrectal ultrasound probe was placed in the rectum and affixed to the stand. He was then sterilely draped.  Real time ultrasonography was used along with the seed planning software [employer] Prostate vs. 4.2.2.4. This was used to develop the seed plan including the number of needles as well as number of seeds required for complete and adequate coverage.  The needles were then preloaded with seeds and spacers according to the previously developed plan.  Real-time ultrasonography was then used along with the previously developed plan to implant a total of 83 seeds using 26 needles. This proceeded without difficulty or complication.   I then proceeded with placement of SpaceOAR by introducing a needle with the bevel angled inferiorly approximately 2 cm superior to the anus. This was angled downward and under direct ultrasound was  placed within the space between the prostatic capsule and rectum. This was confirmed with a small amount of sterile saline injected and this was performed under direct ultrasound. I then attached the SpaceOAR to the needle and injected this in the space between the prostate and rectum with good placement noted.  A Foley catheter was then removed as well as the transrectal ultrasound probe and rectal probe. Flexible cystoscopy was then performed using the 17 French flexible scope which revealed a normal urethra throughout its length down to the sphincter which appeared intact. The prostatic urethra revealed bilobar hypertrophy but no evidence of obstruction or lesions.  I identified a seed in the floor of the prostatic urethra just to the right of midline.  The bladder was then entered and fully and systematically inspected. The ureteral orifices were noted to be of normal configuration and position. The mucosa revealed no evidence of tumors. There were also no stones identified within the bladder. I noted no seeds or spacers on the floor of the bladder and retroflexion of the scope revealed no seeds protruding from the base of the prostate. I used flexible grasping forceps passed through the cystoscope and was able to grasp the seed and it was extracted with the attached spacers and a second seed.  A total of 2 seeds were removed.  C-arm fluoroscopy was then used to evaluate the distribution of seeds placement and aid in determining confirmation of the number of seeds placed.  Real-time fluoroscopy was used with saved images revealing the location of the seeds placed both in AP and oblique views.  The cystoscope was then removed and the patient was awakened and taken to recovery  room in stable and satisfactory condition. He tolerated procedure well and there were no intraoperative complications.

## 2019-03-15 NOTE — Progress Notes (Signed)
  Radiation Oncology         (336) [phone] ________________________________  Name: [name] MRN: [id number]  Date: 03/15/2019  DOB: Feb 17, 1946       Prostate Seed Implant  JM:EQASTM, Lennette Bihari, MD  No ref. provider found  DIAGNOSIS: 73 y.o. gentleman with Stage T1c adenocarcinoma of the prostate with Gleason score of 3+4, and PSA of 2.93.    ICD-10-CM   1. Prostate cancer ([place])  C61 DG Chest 2 View    DG Chest 2 View    PROCEDURE: Insertion of radioactive I-125 seeds into the prostate gland.  RADIATION DOSE: 145 Gy, definitive therapy.  TECHNIQUE: JAROD BOZZO was brought to the operating room with the urologist. He was placed in the dorsolithotomy position. He was catheterized and a rectal tube was inserted. The perineum was shaved, prepped and draped. The ultrasound probe was then introduced into the rectum to see the prostate gland.  TREATMENT DEVICE: A needle grid was attached to the ultrasound probe stand and anchor needles were placed.  3D PLANNING: The prostate was imaged in 3D using a sagittal sweep of the prostate probe. These images were transferred to the planning computer. There, the prostate, urethra and rectum were defined on each axial reconstructed image. Then, the software created an optimized 3D plan and a few seed positions were adjusted. The quality of the plan was reviewed using [facility] information for the target and the following two organs at risk:  Urethra and Rectum.  Then the accepted plan was printed and handed off to the radiation therapist.  Under my supervision, the custom loading of the seeds and spacers was carried out and loaded into sealed vicryl sleeves.  These pre-loaded needles were then placed into the needle holder.[name]  PROSTATE VOLUME STUDY:  Using transrectal ultrasound the volume of the prostate was verified to be 53.66 cc.  SPECIAL TREATMENT PROCEDURE/SUPERVISION AND HANDLING: The pre-loaded needles were then delivered under sagittal guidance. A  total of 26 needles were used to deposit 83 seeds in the prostate gland. The individual seed activity was 0.594 mCi.  SpaceOAR:  Yes  COMPLEX SIMULATION: At the end of the procedure, an anterior radiograph of the pelvis was obtained to document seed positioning and count. Cystoscopy was performed to check the urethra and bladder.  MICRODOSIMETRY: At the end of the procedure, the patient was emitting less than 0.5 mR/hr at 1 meter. Accordingly, he was considered safe for hospital discharge.  PLAN: The patient will return to the radiation oncology clinic for post implant CT dosimetry in three weeks.   ________________________________  Sheral Apley Tammi Klippel, M.D.

## 2019-03-15 NOTE — Anesthesia Procedure Notes (Signed)
Procedure Name: LMA Insertion Date/Time: 03/15/2019 7:39 AM Performed by: Teven Mittman D, CRNA Pre-anesthesia Checklist: Patient identified, Emergency Drugs available, Suction available and Patient being monitored Patient Re-evaluated:Patient Re-evaluated prior to induction Oxygen Delivery Method: Circle system utilized Preoxygenation: Pre-oxygenation with 100% oxygen Induction Type: IV induction Ventilation: Mask ventilation without difficulty LMA Size: 4.0 Tube type: Oral Number of attempts: 1 Placement Confirmation: positive ETCO2 and breath sounds checked- equal and bilateral Tube secured with: Tape Dental Injury: Teeth and Oropharynx as per pre-operative assessment

## 2019-03-16 ENCOUNTER — Encounter (HOSPITAL_BASED_OUTPATIENT_CLINIC_OR_DEPARTMENT_OTHER): Payer: Self-pay | Admitting: Urology

## 2019-03-16 NOTE — Anesthesia Postprocedure Evaluation (Signed)
Anesthesia Post Note  Patient: Joseph Foster  Procedure(s) Performed: RADIOACTIVE SEED IMPLANT/BRACHYTHERAPY IMPLANT (N/A Prostate) CYSTOSCOPY (N/A Bladder)     Patient location during evaluation: PACU Anesthesia Type: General Level of consciousness: awake and alert Pain management: pain level controlled Vital Signs Assessment: post-procedure vital signs reviewed and stable Respiratory status: spontaneous breathing, nonlabored ventilation, respiratory function stable and patient connected to nasal cannula oxygen Cardiovascular status: blood pressure returned to baseline and stable Postop Assessment: no apparent nausea or vomiting Anesthetic complications: no    Last Vitals:  Vitals:   03/15/19 1000 03/15/19 1115  BP: (!) 128/98 121/82  Pulse: 74 75  Resp: 17 14  Temp:  36.7 C  SpO2: 97% 97%    Last Pain:  Vitals:   03/15/19 1030  TempSrc:   PainSc: [name]

## 2019-03-30 ENCOUNTER — Telehealth: Payer: Self-pay | Admitting: *Deleted

## 2019-03-30 NOTE — Telephone Encounter (Signed)
CALLED PATIENT TO REMIND OF POST SEED APPTS., SPOKE WITH PATIENT AND HE IS AWARE OF THESE APPTS.

## 2019-03-31 ENCOUNTER — Ambulatory Visit
Admission: RE | Admit: 2019-03-31 | Discharge: 2019-03-31 | Disposition: A | Discharge status: Home / Self Care | Payer: BC Managed Care – PPO | Source: Ambulatory Visit | Attending: Urology | Admitting: Urology

## 2019-03-31 ENCOUNTER — Ambulatory Visit
Admission: RE | Admit: 2019-03-31 | Discharge: 2019-03-31 | Disposition: A | Discharge status: Home / Self Care | Payer: BC Managed Care – PPO | Source: Ambulatory Visit | Attending: Radiation Oncology | Admitting: Radiation Oncology

## 2019-03-31 ENCOUNTER — Other Ambulatory Visit: Payer: Self-pay

## 2019-03-31 ENCOUNTER — Encounter: Payer: Self-pay | Admitting: Urology

## 2019-03-31 VITALS — BP 128/91 | HR 75 | Temp 98.3°F | Resp 20 | Wt 257.0 lb

## 2019-03-31 DIAGNOSIS — Z7982 Long term (current) use of aspirin: Secondary | ICD-10-CM | POA: Diagnosis not present

## 2019-03-31 DIAGNOSIS — R3911 Hesitancy of micturition: Secondary | ICD-10-CM | POA: Insufficient documentation

## 2019-03-31 DIAGNOSIS — C61 Malignant neoplasm of prostate: Secondary | ICD-10-CM | POA: Insufficient documentation

## 2019-03-31 DIAGNOSIS — Z79899 Other long term (current) drug therapy: Secondary | ICD-10-CM | POA: Insufficient documentation

## 2019-03-31 DIAGNOSIS — Z923 Personal history of irradiation: Secondary | ICD-10-CM | POA: Insufficient documentation

## 2019-03-31 DIAGNOSIS — R5383 Other fatigue: Secondary | ICD-10-CM | POA: Insufficient documentation

## 2019-03-31 NOTE — Progress Notes (Addendum)
Mr. Wessinger is here for a follow-up appointment today for post seed. Patient states that he has some dysuria. Patient reports mild fatigue. Patient denies nay hematuria.Patient states that he empties his bladder with urination. Patient states that his stream varies. Patient denies any urgency with urination.Patint states that he does not void once he goes to bed at night.Patient did not have an MRI due to having a spinal cord stimulator.Patient is unsure of the date that he has to go to his urologist. Vitals:   03/31/19 1416  BP: (!) 128/91  Pulse: 75  Resp: 20  Temp: 98.3 F (36.8 C)  TempSrc: Oral  SpO2: 99%  Weight: 257 lb (116.6 kg)

## 2019-03-31 NOTE — Addendum Note (Signed)
Encounter addended by: Sherrlyn Hock, LPN on: 0/12/7531 9:17 PM  Actions taken: Charge Capture section accepted

## 2019-03-31 NOTE — Progress Notes (Signed)
Radiation Oncology         [phone]) [phone] ________________________________  Name: Joseph Foster MRN: [id number]  Date: 03/31/2019  DOB: Jul 20, 1946  Post-Seed Follow-Up Visit Note  CC: Hulan Fess, MD  Hulan Fess, MD  Diagnosis:   73 y.o. gentleman with Stage T1c adenocarcinoma of the prostate with Gleason score of 3+4, and PSA of 2.93.    ICD-10-CM   1. Malignant neoplasm of prostate ([facility])  C61     Interval Since Last Radiation: 3 weeks  03/15/19:  Insertion of radioactive I-125 seeds into the prostate gland; 125 Gy, definitive with placement of SpaceOAR gel.  Narrative:  The patient returns today for routine follow-up.  He is complaining of increased urinary frequency, urgency and urinary hesitation symptoms. He filled out a questionnaire regarding urinary function today providing and overall IPSS score of 10 characterizing his symptoms as mild-moderate.  His pre-implant score was 2.  He specifically denies dysuria, gross hematuria, straining to void, incomplete bladder emptying or incontinence.  He denies any abdominal pain or bowel symptoms.  He reports a healthy appetite and is maintaining his weight.  He has noticed mild fatigue but this is not debilitating in any way and he has remained active.  Overall, he is quite pleased with his progress to date.  ALLERGIES:  is allergic to codeine; dilaudid [hydromorphone hcl]; penicillins; simvastatin; ceclor [cefaclor]; oxycontin [oxycodone hcl]; and sulfa antibiotics.  Meds: Current Outpatient Medications  Medication Sig Dispense Refill  . ALPRAZolam (XANAX) 0.5 MG tablet Take 0.5 mg by mouth 2 (two) times daily as needed for anxiety.     [name] aspirin EC 81 MG tablet Take 1 tablet (81 mg total) by mouth daily.    . carvedilol (COREG) 3.125 MG tablet TAKE 1 TABLET BY MOUTH TWICE A DAY 180 tablet 2  . ciprofloxacin (CIPRO) 500 MG tablet Take 1 tablet (500 mg total) by mouth 2 (two) times daily. 6 tablet 0  . ENTRESTO 24-26 MG TAKE 1 TABLET  BY MOUTH TWICE A DAY 60 tablet 6  . esomeprazole (NEXIUM) 20 MG capsule Take 20 mg by mouth as needed.    . gabapentin (NEURONTIN) 600 MG tablet Take 600 mg by mouth 2 (two) times daily.     [name] ipratropium (ATROVENT) 0.03 % nasal spray SPRAY 2 SPRAYS INTO EACH NOSTRIL TWICE A DAY    . Multiple Vitamin (MULTIVITAMIN) tablet Take 1 tablet by mouth daily.    [name] PARoxetine (PAXIL) 40 MG tablet Take 40 mg by mouth every morning.    . tadalafil (CIALIS) 20 MG tablet Take 10 mg by mouth daily as needed for erectile dysfunction.    . tamsulosin (FLOMAX) 0.4 MG CAPS capsule Take 0.4 mg by mouth daily.    . traMADol (ULTRAM) 50 MG tablet Take 1 tablet (50 mg total) by mouth every 6 (six) hours as needed. 10 tablet 0   No current facility-administered medications for this encounter.     Physical Findings: In general this is a well appearing Caucasian male in no acute distress.  He's alert and oriented x4 and appropriate throughout the examination. Cardiopulmonary assessment is negative for acute distress and he exhibits normal effort.   Lab Findings: Lab Results  Component Value Date   WBC 8.0 03/11/2019   HGB 14.9 03/11/2019   HCT 44.5 03/11/2019   MCV 95.5 03/11/2019   PLT 219 03/11/2019    Radiographic Findings:  Patient underwent CT imaging in our clinic for post implant dosimetry. The CT will  be reviewed by Dr. Tammi Klippel to confirm there is an adequate distribution of radioactive seeds throughout the prostate gland and ensure that there are no seeds in or near the rectum. His is not scheduled for prostate MRI due to history of spinal stimulator. We suspect the final radiation plan and dosimetry will show appropriate coverage of the prostate gland. He understands that we will call and inform him of any unexpected findings on further review of his imaging and dosimetry.  Impression/Plan:  73 y.o. gentleman with Stage T1c adenocarcinoma of the prostate with Gleason score of 3+4, and PSA of 2.93.  The patient is recovering from the effects of radiation. His urinary symptoms should gradually improve over the next 4-6 months. We talked about this today. He is encouraged by his improvement already and is otherwise pleased with his outcome. We also talked about long-term follow-up for prostate cancer following seed implant. He understands that ongoing PSA determinations and digital rectal exams will help perform surveillance to rule out disease recurrence. He will follow up with Dr. Karsten Ro around September 2020 for repeat PSA and understands what to expect with his PSA measures. Patient was also educated today about some of the long-term effects from radiation including a small risk for rectal bleeding and possibly erectile dysfunction. We talked about some of the general management approaches to these potential complications. However, I did encourage the patient to contact our office or return at any point if he has questions or concerns related to his previous radiation and prostate cancer.    Nicholos Johns, PA-C

## 2019-04-02 NOTE — Progress Notes (Signed)
  Radiation Oncology         [phone]) [phone] ________________________________  Name: Joseph Foster MRN: [id number]  Date: 03/31/2019  DOB: Mar 05, 1946  COMPLEX SIMULATION NOTE  NARRATIVE:  The patient was brought to the [facility] today following prostate seed implantation approximately one month ago.  Identity was confirmed.  All relevant records and images related to the planned course of therapy were reviewed.  Then, the patient was set-up supine.  CT images were obtained.  The CT images were loaded into the planning software.  Then the prostate and rectum were contoured.  Treatment planning then occurred.  The implanted iodine 125 seeds were identified by the physics staff for projection of radiation distribution  I have requested : 3D Simulation  I have requested a DVH of the following structures: Prostate and rectum.    ________________________________  Sheral Apley Tammi Klippel, M.D.

## 2019-05-14 ENCOUNTER — Telehealth: Payer: Self-pay | Admitting: *Deleted

## 2019-05-14 NOTE — Telephone Encounter (Signed)
    [facility] Pre-operative Risk Assessment    Request for surgical clearance:  1. What type of surgery is being performed? LEFT TOTAL KNEE ARTHROPLASTY   2. When is this surgery scheduled? 08/16/19   3. What type of clearance is required (medical clearance vs. Pharmacy clearance to hold med vs. Both)? MEDICAL  4. Are there any medications that need to be held prior to surgery and how long? ASA   5. Practice name and name of physician performing surgery? [facility] ; DR. Wynelle Link   6. What is your office phone number [phone]    7.   What is your office fax number [phone]  8.   Anesthesia type (None, local, MAC, general) ? CHOICE   Julaine Hua 05/14/2019, 10:33 AM  _________________________________________________________________   (provider comments below)

## 2019-05-14 NOTE — Telephone Encounter (Signed)
Pre op clearance requested for November 16th for knee replacement.  It is too far away to clear him now.  I expect this won't be an issue unless he develops new symptoms between now and then. I have asked him to contact us the first week of November and we can proceed with clearance then if he is doing well.  I will fax this to the operating surgeon via [facility].  Kerin Ransom PA-C 05/14/2019 11:26 AM

## 2019-06-21 ENCOUNTER — Ambulatory Visit
Admission: RE | Admit: 2019-06-21 | Discharge: 2019-06-21 | Disposition: A | Discharge status: Home / Self Care | Payer: BC Managed Care – PPO | Source: Ambulatory Visit | Attending: Radiation Oncology | Admitting: Radiation Oncology

## 2019-06-21 ENCOUNTER — Encounter: Payer: Self-pay | Admitting: Radiation Oncology

## 2019-06-21 DIAGNOSIS — C61 Malignant neoplasm of prostate: Secondary | ICD-10-CM | POA: Diagnosis not present

## 2019-06-27 NOTE — Progress Notes (Signed)
  Radiation Oncology         (336) [phone] ________________________________  Name: Joseph Foster MRN: [id number]  Date: 06/21/2019  DOB: Jul 09, 1946  3D Planning Note   Prostate Brachytherapy Post-Implant Dosimetry  Diagnosis: 73 y.o. gentleman with Stage T1c adenocarcinoma of the prostate with Gleason score of 3+4, and PSA of 2.93  Narrative: On a previous date, Joseph Foster returned following prostate seed implantation for post implant planning. He underwent CT scan complex simulation to delineate the three-dimensional structures of the pelvis and demonstrate the radiation distribution.  Since that time, the seed localization, and complex isodose planning with dose volume histograms have now been completed.  Results:   Prostate Coverage - The dose of radiation delivered to the 90% or more of the prostate gland (D90) was 98.07% of the prescription dose. This exceeds our goal of greater than 90%. Rectal Sparing - The volume of rectal tissue receiving the prescription dose or higher was 0.01 cc. This falls under our thresholds tolerance of 1.0 cc.  Impression: The prostate seed implant appears to show adequate target coverage and appropriate rectal sparing.  Plan:  The patient will continue to follow with urology for ongoing PSA determinations. I would anticipate a high likelihood for local tumor control with minimal risk for rectal morbidity.  ________________________________  Sheral Apley Tammi Klippel, M.D.

## 2019-07-08 ENCOUNTER — Other Ambulatory Visit: Payer: Self-pay | Admitting: Cardiovascular Disease

## 2019-08-16 ENCOUNTER — Inpatient Hospital Stay: Admit: 2019-08-16 | Payer: BC Managed Care – PPO | Admitting: Orthopedic Surgery

## 2019-08-16 SURGERY — ARTHROPLASTY, KNEE, TOTAL
Anesthesia: Choice | Laterality: Left

## 2019-09-15 ENCOUNTER — Other Ambulatory Visit: Payer: Self-pay | Admitting: Cardiovascular Disease

## 2019-10-20 ENCOUNTER — Ambulatory Visit: Payer: Medicare Other | Attending: Internal Medicine

## 2019-10-20 DIAGNOSIS — Z23 Encounter for immunization: Secondary | ICD-10-CM | POA: Insufficient documentation

## 2019-10-20 NOTE — Progress Notes (Signed)
   Covid-19 Vaccination Clinic  Name:  Joseph Foster    MRN: [id number] DOB: 1946/01/19  10/20/2019  Joseph Foster was observed post Covid-19 immunization for 15 minutes without incidence. He was provided with Vaccine Information Sheet and instruction to access the V-Safe system.   Joseph Foster was instructed to call 911 with any severe reactions post vaccine: [name] Difficulty breathing  . Swelling of your face and throat  . A fast heartbeat  . A bad rash all over your body  . Dizziness and weakness    Immunizations Administered    Name Date Dose VIS Date Route   Pfizer COVID-19 Vaccine 10/20/2019  6:50 PM 0.3 mL 09/10/2019 Intramuscular   Manufacturer: [place]   Lot: [id number]   [place]: [id number]

## 2019-10-25 ENCOUNTER — Ambulatory Visit: Payer: Self-pay | Admitting: Cardiovascular Disease

## 2019-10-26 ENCOUNTER — Encounter: Payer: Self-pay | Admitting: Cardiovascular Disease

## 2019-10-26 NOTE — Progress Notes (Signed)
Cardiology Office Note   Date:  10/26/2019   ID:  Joseph Foster, DOB January 04, 1946, MRN [id number]  PCP:  Hulan Fess, MD  Cardiologist:   Mertie Moores, MD   Chief Complaint  Patient presents with  . Congestive Heart Failure   Problem list 1. Essential hypertension 2. Combined systolic and diastolic congestive heart failure-  3. COPD 4. Anxiety/depression 5. Hyperlipidemia  6.  TIA - on ASA 325 for his TIA     Joseph Foster is a 74 y.o. male who presents for evaluation of CHF Has not had and dyspnea.   Walks 3-5 a day , Monday - Friday . No PND or orthopnea. No CP , no syncope.   Still eats some salt  - eats soup once a week, hot dogs once a month. Eats deli meats that have some salt   Non-smoker Drinks lots of beer -  4-5 a day.  More on the weekend.   April 17, 2016:;     Doing well. Walks every day - several miles No CP or dyspnea.  Some fatigue climbing a hill  Nov. 15, 2017:  Joseph Foster is seen Today for his combined systolic and diastolic congestive heart failure. Heart catheterization on 04/29/2016 reveals mild LAD stenosis. His LV systolic function was normal. He has grade 1 diastolic dysfunction by echo   Aug. 20, 2018:  Doing well.    Walks 5 days a week.  Rides his motorcycle on the weekends. EF is 45-50% Avoids salt.   Feb. 4, 2019:  Doing well.  Tolerating the Entresto BP is low.     Gets dizzy after drinking several beers.    Sept. 24, 2019: Joseph Foster is seen back today for follow-up of his chronic combined systolic and diastolic congestive heart failure: Echocardiogram in 2015 revealed an ejection fraction of 45%.  Most recent echocardiogram in August, 2019 reveals normal left ventricular systolic function with an EF of 55%.  He has grade 1 diastolic dysfunction.  Walks Foster ,  No CP or dyspnea.   Jan. 27, 2021;  Joseph Foster is seen back today for follow-up of his chronic combined systolic and diastolic congestive heart failure. He was last  seen 2 years ago. breathinig is ok Has some orthostatic hypotension .   Seems to have improved.   Past Medical History:  Diagnosis Date  . Acute medial meniscus tear of left knee   . Anxiety   . Aortic dilatation (HCC) 05/12/2018   Mild, noted on ECHO  . Arthritis KNEES  . Chronic back pain   . Chronic combined systolic and diastolic heart failure ([place])   . COPD (chronic obstructive pulmonary disease) (HCC)    asymptomatic, denies shortness of breath  . DDD (degenerative disc disease), lumbar L4  . Depression   . GERD (gastroesophageal reflux disease)   . Grade I diastolic dysfunction [id number]   Noted on ECHO  . Hyperlipidemia   . Hypertension   . Insomnia   . LAD stenosis 03/2016   Mild, noted on heart catheterization  . 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 Lung nodule 2010   Left lung base nodule  . Memory loss   . Numbness and tingling in both hands    while riding motorcycle  . Numbness and tingling of both feet    pinched nerve  . Pneumonia    age 68's  . Prostate cancer ([place])   . Ringing in ears    Resolved  .  TIA (transient ischemic attack) 2015    Past Surgical History:  Procedure Laterality Date  . CARDIAC CATHETERIZATION N/A 04/29/2016   Procedure: Left Heart Cath and Coronary Angiography;  Surgeon: Leonie Man, MD;  Location: [facility];  Service: Cardiovascular;  Laterality: N/A;  . COLONOSCOPY    . CYSTOSCOPY N/A 03/15/2019   Procedure: CYSTOSCOPY;  Surgeon: Kathie Rhodes, MD;  Location: [facility];  Service: Urology;  Laterality: N/A;  2 seeds detected and removed by Dr. Karsten Ro  . HC EEG 41-60MINS  08/23/2014      . KNEE ARTHROSCOPY  01/15/2012   Procedure: ARTHROSCOPY KNEE;  Surgeon: Gearlean Alf, MD;  Location: [facility];  Service: Orthopedics;  Laterality: Left;  meniscal debridement  . RADIOACTIVE SEED IMPLANT N/A 03/15/2019   Procedure: RADIOACTIVE SEED IMPLANT/BRACHYTHERAPY  IMPLANT;  Surgeon: Kathie Rhodes, MD;  Location: [facility];  Service: Urology;  Laterality: N/A;  81 seeds  . RIGHT KNEE SURG.  2005  . SPINAL CORD STIMULATOR IMPLANT  2009  . TONSILECTOMY/ADENOIDECTOMY WITH MYRINGOTOMY    . TOTAL KNEE ARTHROPLASTY Right 12/26/2014   Procedure: TOTAL RIGHT  KNEE ARTHROPLASTY;  Surgeon: Gaynelle Arabian, MD;  Location: [facility];  Service: Orthopedics;  Laterality: Right;  . UPPER GI ENDOSCOPY    . VASECTOMY       Current Outpatient Medications  Medication Sig Dispense Refill  . ALPRAZolam (XANAX) 0.5 MG tablet Take 0.5 mg by mouth 2 (two) times Foster as needed for anxiety.     [name] aspirin EC 81 MG tablet Take 1 tablet (81 mg total) by mouth Foster.    . carvedilol (COREG) 3.125 MG tablet TAKE 1 TABLET BY MOUTH TWICE A DAY 180 tablet 2  . ciprofloxacin (CIPRO) 500 MG tablet Take 1 tablet (500 mg total) by mouth 2 (two) times Foster. 6 tablet 0  . ENTRESTO 24-26 MG TAKE 1 TABLET BY MOUTH TWICE A DAY 60 tablet 1  . esomeprazole (NEXIUM) 20 MG capsule Take 20 mg by mouth as needed.    . gabapentin (NEURONTIN) 600 MG tablet Take 600 mg by mouth 2 (two) times Foster.     [name] ipratropium (ATROVENT) 0.03 % nasal spray SPRAY 2 SPRAYS INTO EACH NOSTRIL TWICE A DAY    . Multiple Vitamin (MULTIVITAMIN) tablet Take 1 tablet by mouth Foster.    [name] PARoxetine (PAXIL) 40 MG tablet Take 40 mg by mouth every morning.    . tadalafil (CIALIS) 20 MG tablet Take 10 mg by mouth Foster as needed for erectile dysfunction.    . tamsulosin (FLOMAX) 0.4 MG CAPS capsule Take 0.4 mg by mouth Foster.    . traMADol (ULTRAM) 50 MG tablet Take 1 tablet (50 mg total) by mouth every 6 (six) hours as needed. 10 tablet 0   No current facility-administered medications for this visit.    Allergies:   Codeine, Dilaudid [hydromorphone hcl], Penicillins, Simvastatin, Ceclor [cefaclor], Oxycontin [oxycodone hcl], and Sulfa antibiotics    Social History:  The patient  reports that he has been  smoking cigars. He has a 5.00 pack-year smoking history. He has quit using smokeless tobacco.  His smokeless tobacco use included snuff. He reports current alcohol use of about 21.0 standard drinks of alcohol per week. He reports current drug use. Drug: Marijuana.   Family History:  The patient's family history includes Arthritis in his mother; Heart disease in his father; Hyperlipidemia in his brother and mother; Leukemia in his mother; Lung  cancer in his father.    ROS:   See current history.  All other systems are negative  Physical Exam: There were no vitals taken for this visit.  GEN:  Well nourished, well developed in no acute distress HEENT: Normal NECK: No JVD; No carotid bruits LYMPHATICS: No lymphadenopathy CARDIAC: RRR , no murmurs, rubs, gallops RESPIRATORY:  Clear to auscultation without rales, wheezing or rhonchi  ABDOMEN: Soft, non-tender, non-distended MUSCULOSKELETAL:  No edema; No deformity  SKIN: Warm and dry NEUROLOGIC:  Alert and oriented x 3    EKG:   Recent Labs: 03/11/2019: ALT 28; BUN 19; Creatinine, Ser 1.09; Hemoglobin 14.9; Platelets 219; Potassium 4.2; Sodium 138    Lipid Panel    Component Value Date/Time   CHOL 165 08/23/2014 0618   TRIG 131 08/23/2014 0618   HDL 51 08/23/2014 0618   CHOLHDL 3.2 08/23/2014 0618   VLDL 26 08/23/2014 0618   LDLCALC 88 08/23/2014 0618      Wt Readings from Last 3 Encounters:  03/31/19 257 lb (116.6 kg)  03/15/19 259 lb 7 oz (117.7 kg)  02/24/19 257 lb (116.6 kg)      Other studies Reviewed: Additional studies/ records that were reviewed today include: . Review of the above records demonstrates:    ASSESSMENT AND PLAN:  1. Essential hypertension -BP is well controlled.   Cont current meds.   2. Combined systolic and diastolic congestive heart failure- He is not having any significant episodes of chest pain or shortness of breath.  Continue current medications..  3. COPD  4.  Anxiety/depression  5. Hyperlipidemia -we will check labs at his next visit.  6.  TIA -      Mertie Moores, MD  10/26/2019 4:24 PM    [address], [place], [place]  [address] Phone: [phone]; Fax: [phone]

## 2019-10-27 ENCOUNTER — Encounter: Payer: Self-pay | Admitting: Cardiovascular Disease

## 2019-10-27 ENCOUNTER — Other Ambulatory Visit: Payer: Self-pay

## 2019-10-27 ENCOUNTER — Ambulatory Visit (INDEPENDENT_AMBULATORY_CARE_PROVIDER_SITE_OTHER): Payer: Medicare Other | Admitting: Cardiovascular Disease

## 2019-10-27 VITALS — BP 130/80 | HR 87 | Ht 74.0 in | Wt 267.4 lb

## 2019-10-27 DIAGNOSIS — I5042 Chronic combined systolic (congestive) and diastolic (congestive) heart failure: Secondary | ICD-10-CM

## 2019-10-27 DIAGNOSIS — I10 Essential (primary) hypertension: Secondary | ICD-10-CM | POA: Diagnosis not present

## 2019-10-27 NOTE — Patient Instructions (Addendum)
Medication Instructions:  Your physician recommends that you continue on your current medications as directed. Please refer to the Current Medication list given to you today.  *If you need a refill on your cardiac medications before your next appointment, please call your pharmacy*  Lab Work: None If you have labs (blood work) drawn today and your tests are completely normal, you will receive your results only by: [name] MyChart Message (if you have MyChart) OR . A paper copy in the mail If you have any lab test that is abnormal or we need to change your treatment, we will call you to review the results.  Testing/Procedures: None  Follow-Up: At [facility], you and your health needs are our priority.  As part of our continuing mission to provide you with exceptional heart care, we have created designated Provider Care Teams.  These Care Teams include your primary Cardiologist (physician) and Advanced Practice Providers (APPs -  Physician Assistants and Nurse Practitioners) who all work together to provide you with the care you need, when you need it.  Your next appointment:   12 month(s)  The format for your next appointment:   In Person  Provider:   You may see Mertie Moores, MD or one of the following Advanced Practice Providers on your designated Care Team:    Richardson Dopp, PA-C  [place], [place]  Daune Perch, NP   Other Instructions  Have Dr. Rex Kras fax your lab results to our office at [phone].

## 2019-11-10 ENCOUNTER — Ambulatory Visit: Payer: Medicare Other | Attending: Internal Medicine

## 2019-11-10 DIAGNOSIS — Z23 Encounter for immunization: Secondary | ICD-10-CM | POA: Insufficient documentation

## 2019-11-10 NOTE — Progress Notes (Signed)
   Covid-19 Vaccination Clinic  Name:  Joseph Foster    MRN: [id number] DOB: April 28, 1946  11/10/2019  [name] was observed post Covid-19 immunization for 15 minutes without incidence. He was provided with Vaccine Information Sheet and instruction to access the V-Safe system.   [name] was instructed to call 911 with any severe reactions post vaccine: [name] Difficulty breathing  . Swelling of your face and throat  . A fast heartbeat  . A bad rash all over your body  . Dizziness and weakness    Immunizations Administered    Name Date Dose VIS Date Route   Pfizer COVID-19 Vaccine 11/10/2019 12:37 PM 0.3 mL 09/10/2019 Intramuscular   Manufacturer: [place]   Lot: [id number]   [place]: [id number]

## 2019-11-30 ENCOUNTER — Other Ambulatory Visit: Payer: Self-pay | Admitting: Cardiovascular Disease

## 2019-12-21 ENCOUNTER — Other Ambulatory Visit: Payer: Self-pay | Admitting: Family Medicine

## 2019-12-21 DIAGNOSIS — Z8673 Personal history of transient ischemic attack (TIA), and cerebral infarction without residual deficits: Secondary | ICD-10-CM

## 2019-12-24 ENCOUNTER — Other Ambulatory Visit: Payer: Medicare Other

## 2019-12-24 ENCOUNTER — Ambulatory Visit
Admission: RE | Admit: 2019-12-24 | Discharge: 2019-12-24 | Disposition: A | Discharge status: Home / Self Care | Payer: Medicare Other | Source: Ambulatory Visit | Attending: Family Medicine | Admitting: Family Medicine

## 2019-12-24 ENCOUNTER — Other Ambulatory Visit: Payer: Self-pay

## 2019-12-24 DIAGNOSIS — Z8673 Personal history of transient ischemic attack (TIA), and cerebral infarction without residual deficits: Secondary | ICD-10-CM

## 2020-03-31 ENCOUNTER — Other Ambulatory Visit: Payer: Self-pay | Admitting: Cardiovascular Disease

## 2020-04-28 ENCOUNTER — Encounter (HOSPITAL_COMMUNITY): Payer: Self-pay

## 2020-04-28 ENCOUNTER — Emergency Department (HOSPITAL_COMMUNITY)
Admission: EM | Admit: 2020-04-28 | Discharge: 2020-04-28 | Disposition: A | Discharge status: Home / Self Care | Payer: Medicare Other | Attending: Emergency Medicine | Admitting: Emergency Medicine

## 2020-04-28 ENCOUNTER — Other Ambulatory Visit: Payer: Self-pay

## 2020-04-28 ENCOUNTER — Other Ambulatory Visit: Payer: Self-pay | Admitting: Infectious Diseases

## 2020-04-28 ENCOUNTER — Emergency Department (HOSPITAL_COMMUNITY): Payer: Medicare Other

## 2020-04-28 DIAGNOSIS — U071 COVID-19: Secondary | ICD-10-CM | POA: Diagnosis not present

## 2020-04-28 DIAGNOSIS — I2510 Atherosclerotic heart disease of native coronary artery without angina pectoris: Secondary | ICD-10-CM | POA: Diagnosis not present

## 2020-04-28 DIAGNOSIS — I10 Essential (primary) hypertension: Secondary | ICD-10-CM | POA: Insufficient documentation

## 2020-04-28 DIAGNOSIS — F17290 Nicotine dependence, other tobacco product, uncomplicated: Secondary | ICD-10-CM | POA: Diagnosis not present

## 2020-04-28 DIAGNOSIS — Z79899 Other long term (current) drug therapy: Secondary | ICD-10-CM | POA: Insufficient documentation

## 2020-04-28 DIAGNOSIS — Z7982 Long term (current) use of aspirin: Secondary | ICD-10-CM | POA: Diagnosis not present

## 2020-04-28 DIAGNOSIS — Z6833 Body mass index (BMI) 33.0-33.9, adult: Secondary | ICD-10-CM

## 2020-04-28 DIAGNOSIS — J441 Chronic obstructive pulmonary disease with (acute) exacerbation: Secondary | ICD-10-CM | POA: Insufficient documentation

## 2020-04-28 MED ORDER — SODIUM CHLORIDE 0.9 % IV SOLN
Freq: Once | INTRAVENOUS | Status: AC
  Filled 2020-04-28: qty 600

## 2020-04-28 NOTE — Progress Notes (Signed)
I connected by phone with Joseph Foster on 04/28/2020 at 9:09 PM to discuss the potential use of a new treatment for mild to moderate COVID-19 viral infection in non-hospitalized patients.  This patient is a 74 y.o. male that meets the FDA criteria for Emergency Use Authorization of COVID monoclonal antibody casirivimab/imdevimab.  Has a (+) direct SARS-CoV-2 viral test result  Has mild or moderate COVID-19   Is NOT hospitalized due to COVID-19  Is within 10 days of symptom onset  Has at least one of the high risk factor(s) for progression to severe COVID-19 and/or hospitalization as defined in EUA.  Specific high risk criteria : BMI > 25   I have spoken and communicated the following to the patient or parent/caregiver regarding COVID monoclonal antibody treatment:  1. FDA has authorized the emergency use for the treatment of mild to moderate COVID-19 in adults and pediatric patients with positive results of direct SARS-CoV-2 viral testing who are 66 years of age and older weighing at least 40 kg, and who are at high risk for progressing to severe COVID-19 and/or hospitalization.  2. The significant known and potential risks and benefits of COVID monoclonal antibody, and the extent to which such potential risks and benefits are unknown.  3. Information on available alternative treatments and the risks and benefits of those alternatives, including clinical trials.  4. Patients treated with COVID monoclonal antibody should continue to self-isolate and use infection control measures (e.g., wear mask, isolate, social distance, avoid sharing personal items, clean and disinfect "high touch" surfaces, and frequent handwashing) according to CDC guidelines.   5. The patient or parent/caregiver has the option to accept or refuse COVID monoclonal antibody treatment.  After reviewing this information with the patient, The patient agreed to proceed with receiving casirivimab\imdevimab infusion and  will be provided a copy of the Fact sheet prior to receiving the infusion. Janene Madeira 04/28/2020 9:09 PM

## 2020-04-28 NOTE — Discharge Instructions (Addendum)
You are scheduled for outpatient casirivimab and imdevimab infusion at 8:30am on Saturday July 31st at [facility] Please park at [place], as staff will be escorting you through the [place] entrance of the hospital.    There is a wave flag banner located near the 2nd entrance on [address]. Turn into this entrance and immediately turn left and park in 1 of the 5 designated Covid Infusion Parking spots. There is a phone number on the sign, please call and let the staff know what spot you are in and we will come out and get you. For questions call [phone].  Thanks.

## 2020-04-28 NOTE — ED Provider Notes (Signed)
[facility] DEPT Provider Note   CSN: [id number] Arrival date & time: 04/28/20  1311     History Chief Complaint  Patient presents with  . COVID Positive    Joseph Foster is a 74 y.o. male past medical history of COPD, DDD, GERD, hypertension, hyperlipidemia presents for evaluation of Covid and monoclonal antibody infusion.  He reports that he started having some cold-like symptoms about 5 days ago.  He saw his doctor yesterday and was tested for Covid and was found to be positive.  He was sent to the ED for evaluation of monoclonal antibody infusions.  Patient does report that he has had some cough but states it is been productive of white phlegm.  No hemoptysis.  He does feel like his breathing was worse about 3 days ago but states it is overall since improved.  He has had some subjective fever/chills at home.  He does have history of COPD.  He does not smoke.  Denies any nausea/vomiting or abdominal pain.  The history is provided by the patient.       Past Medical History:  Diagnosis Date  . Acute medial meniscus tear of left knee   . Anxiety   . Aortic dilatation (HCC) 05/12/2018   Mild, noted on ECHO  . Arthritis KNEES  . Chronic back pain   . Chronic combined systolic and diastolic heart failure ([place])   . COPD (chronic obstructive pulmonary disease) (HCC)    asymptomatic, denies shortness of breath  . DDD (degenerative disc disease), lumbar L4  . Depression   . GERD (gastroesophageal reflux disease)   . Grade I diastolic dysfunction 67/34/1937   Noted on ECHO  . Hyperlipidemia   . Hypertension   . Insomnia   . LAD stenosis 03/2016   Mild, noted on heart catheterization  . 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 Lung nodule 2010   Left lung base nodule  . Memory loss   . Numbness and tingling in both hands    while riding motorcycle  . Numbness and tingling of both feet    pinched nerve  . Pneumonia    age  28's  . Prostate cancer ([place])   . Ringing in ears    Resolved  . TIA (transient ischemic attack) 2015    Patient Active Problem List   Diagnosis Date Noted  . Malignant neoplasm of prostate ([place]) 01/20/2019  . Laceration of finger of left hand 03/25/2018  . Pain in finger of left hand 03/25/2018  . Lumbar radiculopathy 12/05/2016  . Degenerative disc disease, lumbar 11/04/2016  . Facet arthropathy, lumbar 11/04/2016  . Essential hypertension 08/14/2016  . Abnormal nuclear stress test 04/29/2016  . Coronary artery disease involving native coronary artery of native heart without angina pectoris   . Chronic combined systolic and diastolic heart failure ([place]) 09/01/2015  . OA (osteoarthritis) of knee 12/26/2014  . Agitation   . TIA (transient ischemic attack) 08/22/2014  . Tobacco abuse 08/22/2014  . Alcohol abuse 08/22/2014  . Chronic pain 08/22/2014  . Depression 08/22/2014  . Confusion   . Chronic bilateral low back pain without sciatica 05/27/2013  . Pain in joint, lower leg 05/27/2013  . Pain syndrome, chronic 05/27/2013  . Numbness 04/19/2013  . Acute medial meniscal tear 01/15/2012    Past Surgical History:  Procedure Laterality Date  . CARDIAC CATHETERIZATION N/A 04/29/2016   Procedure: Left Heart Cath and Coronary Angiography;  Surgeon: Leonie Green  Ellyn Hack, MD;  Location: [facility];  Service: Cardiovascular;  Laterality: N/A;  . COLONOSCOPY    . CYSTOSCOPY N/A 03/15/2019   Procedure: CYSTOSCOPY;  Surgeon: Kathie Rhodes, MD;  [facility];  Service: Urology;  Laterality: N/A;  2 seeds detected and removed by Dr. Karsten Ro  . HC EEG 41-60MINS  08/23/2014      . KNEE ARTHROSCOPY  01/15/2012   Procedure: ARTHROSCOPY KNEE;  Surgeon: Gearlean Alf, MD;  Location: [facility];  Service: Orthopedics;  Laterality: Left;  meniscal debridement  . RADIOACTIVE SEED IMPLANT N/A 03/15/2019   Procedure: RADIOACTIVE SEED IMPLANT/BRACHYTHERAPY  IMPLANT;  Surgeon: Kathie Rhodes, MD;  Location: [facility];  Service: Urology;  Laterality: N/A;  81 seeds  . RIGHT KNEE SURG.  2005  . SPINAL CORD STIMULATOR IMPLANT  2009  . TONSILECTOMY/ADENOIDECTOMY WITH MYRINGOTOMY    . TOTAL KNEE ARTHROPLASTY Right 12/26/2014   Procedure: TOTAL RIGHT  KNEE ARTHROPLASTY;  Surgeon: Gaynelle Arabian, MD;  Location: [facility];  Service: Orthopedics;  Laterality: Right;  . UPPER GI ENDOSCOPY    . VASECTOMY         Family History  Problem Relation Age of Onset  . Heart disease Father   . Lung cancer Father        smoker  . Leukemia Mother   . Hyperlipidemia Mother   . Arthritis Mother   . Hyperlipidemia Brother     Social History   Tobacco Use  . Smoking status: Current Every Day Smoker    Packs/day: 0.25    Years: 20.00    Pack years: 5.00    Types: Cigars    Last attempt to quit: 01/09/2001    Years since quitting: 19.3  . Smokeless tobacco: Former Systems developer    Types: Snuff  . Tobacco comment: one cigar daily  Vaping Use  . Vaping Use: Former  Substance Use Topics  . Alcohol use: Yes    Alcohol/week: 21.0 standard drinks    Types: 7 Cans of beer, 14 Shots of liquor per week    Comment: 02/09/15 beer in summer, liquor in winter  . Drug use: Yes    Types: Marijuana    Comment: 3-4 times weekly    Home Medications Prior to Admission medications   Medication Sig Start Date End Date Taking? Authorizing Provider  ALPRAZolam Duanne Moron) 0.5 MG tablet Take 0.5 mg by mouth 2 (two) times daily as needed for anxiety.     [provider]  aspirin EC 81 MG tablet Take 1 tablet (81 mg total) by mouth daily. 05/19/17   Nahser, Wonda Cheng, MD  carvedilol (COREG) 3.125 MG tablet TAKE 1 TABLET BY MOUTH TWICE A DAY 03/31/20   Nahser, Wonda Cheng, MD  ciprofloxacin (CIPRO) 500 MG tablet Take 1 tablet (500 mg total) by mouth 2 (two) times daily. 03/15/19   Kathie Rhodes, MD  ENTRESTO 24-26 MG TAKE 1 TABLET BY MOUTH TWICE A DAY 11/30/19   Nahser,  Wonda Cheng, MD  esomeprazole (NEXIUM) 20 MG capsule Take 20 mg by mouth as needed.    [provider]  gabapentin (NEURONTIN) 600 MG tablet Take 600 mg by mouth 2 (two) times daily.  11/10/18   [provider]  ipratropium (ATROVENT) 0.03 % nasal spray SPRAY 2 SPRAYS INTO EACH NOSTRIL TWICE A DAY 12/09/18   [provider]  montelukast (SINGULAIR) 10 MG tablet Take 10 mg by mouth daily. 10/14/19   [provider]  Multiple Vitamin (MULTIVITAMIN) tablet Take 1 tablet by mouth daily.    [provider]  PARoxetine (PAXIL) 40 MG tablet Take 40 mg by mouth every morning.    [provider]  tadalafil (CIALIS) 20 MG tablet Take 10 mg by mouth daily as needed for erectile dysfunction.    [provider]  tamsulosin (FLOMAX) 0.4 MG CAPS capsule Take 0.4 mg by mouth daily.    [provider]  traMADol (ULTRAM) 50 MG tablet Take 1 tablet (50 mg total) by mouth every 6 (six) hours as needed. 03/15/19   Kathie Rhodes, MD    Allergies    Codeine, Dilaudid [hydromorphone hcl], Penicillins, Simvastatin, Ceclor [cefaclor], Oxycontin [oxycodone hcl], and Sulfa antibiotics  Review of Systems   Review of Systems  Constitutional: Positive for chills, fatigue and fever.  Respiratory: Positive for cough. Negative for shortness of breath.   Cardiovascular: Negative for chest pain.  Gastrointestinal: Negative for abdominal pain, nausea and vomiting.  All other systems reviewed and are negative.   Physical Exam Updated Vital Signs BP (!) 126/103 (BP Location: Right Arm)   Pulse 80   Temp 97.9 F (36.6 C) (Oral)   Resp 20   Ht 6\' 2"  (1.88 m)   Wt (!) 117.9 kg   SpO2 99%   BMI 33.38 kg/m   Physical Exam Vitals and nursing note reviewed.  Constitutional:      Appearance: Normal appearance. He is well-developed.     Comments: Sitting comfortably on examination table  HENT:     Head: Normocephalic and atraumatic.  Eyes:     General:  Lids are normal.     Conjunctiva/sclera: Conjunctivae normal.     Pupils: Pupils are equal, round, and reactive to light.  Cardiovascular:     Rate and Rhythm: Normal rate and regular rhythm.     Pulses: Normal pulses.     Heart sounds: Normal heart sounds. No murmur heard.  No friction rub. No gallop.   Pulmonary:     Effort: Pulmonary effort is normal.     Breath sounds: Normal breath sounds.     Comments: Lungs clear to auscultation bilaterally.  Symmetric chest rise.  No wheezing, rales, rhonchi. Musculoskeletal:        General: Normal range of motion.     Cervical back: Full passive range of motion without pain.  Skin:    General: Skin is warm and dry.     Capillary Refill: Capillary refill takes less than 2 seconds.  Neurological:     Mental Status: He is alert and oriented to person, place, and time.  Psychiatric:        Speech: Speech normal.     ED Results / Procedures / Treatments   Labs (all labs ordered are listed, but only abnormal results are displayed) Labs Reviewed  CBC  BASIC METABOLIC PANEL    EKG None  Radiology DG Chest 2 View  Result Date: 04/28/2020 CLINICAL DATA:  COVID.  Shortness of breath EXAM: CHEST - 2 VIEW COMPARISON:  02/19/2019 FINDINGS: Mild interstitial prominence that is stable. There is no edema, consolidation, effusion, or pneumothorax. Normal heart size and mediastinal contours. Degenerative endplate spurring and dorsal column stimulator. IMPRESSION: No visible pneumonia. Electronically Signed   By: Monte Fantasia M.D.   On: 04/28/2020 14:22    Procedures Procedures (including critical care time)  Medications Ordered in ED Medications - No data to display  ED Course  I have reviewed the triage vital signs and  the nursing notes.  Pertinent labs & imaging results that were available during my care of the patient were reviewed by me and considered in my medical decision making (see chart for details).    MDM  Rules/Calculators/A&P                          74 year old male who recently diagnosed with COVID-19 who presents for evaluation of monoclonal antibody infusion.  Sent by PCP for evaluation of confusion.  History of COPD.  Does feel like he has gotten better.  Initially arrival, he is afebrile, nontoxic-appearing.  Vital signs are stable.  On exam, lungs clear to auscultation bilaterally.  No evidence of respiratory distress.  Discussed with monoclonal antibody infusion center.  Patient will be set up for an appointment.  Patient instructed on appointment time and details. At this time, patient exhibits no emergent life-threatening condition that require further evaluation in ED or admission. Patient had ample opportunity for questions and discussion. All patient's questions were answered with full understanding. Strict return precautions discussed. Patient expresses understanding and agreement to plan.   EDMAN LIPSEY was evaluated in Emergency Department on 04/28/2020 for the symptoms described in the history of present illness. He was evaluated in the context of the global COVID-19 pandemic, which necessitated consideration that the patient might be at risk for infection with the SARS-CoV-2 virus that causes COVID-19. Institutional protocols and algorithms that pertain to the evaluation of patients at risk for COVID-19 are in a state of rapid change based on information released by regulatory bodies including the CDC and federal and state organizations. These policies and algorithms were followed during the patient's care in the ED.  Portions of this note were generated with [employer]. Dictation errors may occur despite best attempts at proofreading.   Final Clinical Impression(s) / ED Diagnoses Final diagnoses:  [id number]    Rx / DC Orders ED Discharge Orders    None       Volanda Napoleon, PA-C 04/28/20 Long, [place], DO 04/28/20 1646

## 2020-04-28 NOTE — ED Triage Notes (Signed)
Pt states that he had a + COVID test yesterday. Pt states that on Monday he had cough, SHOB. Pt states that his symptoms have improved since. Pt states his PCP told him to come to the ED for an infusion.  Pt states he feels like he has a head cold.

## 2020-04-29 ENCOUNTER — Ambulatory Visit (HOSPITAL_COMMUNITY): Payer: Medicare Other

## 2020-04-29 ENCOUNTER — Ambulatory Visit (HOSPITAL_COMMUNITY)
Admission: RE | Admit: 2020-04-29 | Discharge: 2020-04-29 | Disposition: A | Discharge status: Home / Self Care | Payer: Medicare Other | Source: Ambulatory Visit | Attending: Pulmonary Disease | Admitting: Pulmonary Disease

## 2020-04-29 DIAGNOSIS — Z23 Encounter for immunization: Secondary | ICD-10-CM | POA: Insufficient documentation

## 2020-04-29 DIAGNOSIS — U071 COVID-19: Secondary | ICD-10-CM | POA: Diagnosis not present

## 2020-04-29 DIAGNOSIS — Z6833 Body mass index (BMI) 33.0-33.9, adult: Secondary | ICD-10-CM

## 2020-04-29 MED ORDER — ALBUTEROL SULFATE HFA 108 (90 BASE) MCG/ACT IN AERS: 2.0000 | INHALATION_SPRAY | Freq: Once | RESPIRATORY_TRACT | Status: DC | PRN

## 2020-04-29 MED ORDER — METHYLPREDNISOLONE SODIUM SUCC 125 MG IJ SOLR: 125.0000 mg | Freq: Once | INTRAMUSCULAR | Status: DC | PRN

## 2020-04-29 MED ORDER — SODIUM CHLORIDE 0.9 % IV SOLN: INTRAVENOUS | Status: DC | PRN

## 2020-04-29 MED ORDER — FAMOTIDINE IN NACL 20-0.9 MG/50ML-% IV SOLN: 20.0000 mg | Freq: Once | INTRAVENOUS | Status: DC | PRN

## 2020-04-29 MED ORDER — DIPHENHYDRAMINE HCL 50 MG/ML IJ SOLN: 50.0000 mg | Freq: Once | INTRAMUSCULAR | Status: DC | PRN

## 2020-04-29 MED ORDER — EPINEPHRINE 0.3 MG/0.3ML IJ SOAJ: 0.3000 mg | Freq: Once | INTRAMUSCULAR | Status: DC | PRN

## 2020-04-29 NOTE — Discharge Instructions (Signed)

## 2020-04-29 NOTE — Progress Notes (Signed)
  Diagnosis: COVID-19  Physician: Dr. Joya Gaskins   Procedure: Covid Infusion Clinic Med: casirivimab\imdevimab infusion - Provided patient with casirivimab\imdevimab fact sheet for patients, parents and caregivers prior to infusion.  Complications: No immediate complications noted.  Discharge: Discharged home   Claudia Desanctis 04/29/2020

## 2020-08-16 ENCOUNTER — Encounter: Payer: Self-pay | Admitting: Cardiovascular Disease

## 2020-08-16 NOTE — Progress Notes (Signed)
Cardiology Office Note   Date:  08/18/2020   ID:  Joseph Foster, DOB May 09, 1946, MRN [id number]  PCP:  Hulan Fess, MD  Cardiologist:   Mertie Moores, MD   Chief Complaint  Patient presents with  . Hypertension  . Congestive Heart Failure   Problem list 1. Essential hypertension 2. Combined systolic and diastolic congestive heart failure-  3. COPD 4. Anxiety/depression 5. Hyperlipidemia  6.  TIA - on ASA 325 for his TIA     Joseph Foster is a 75 y.o. male who presents for evaluation of CHF Has not had and dyspnea.   Walks 3-5 a day , Monday - Friday . No PND or orthopnea. No CP , no syncope.   Still eats some salt  - eats soup once a week, hot dogs once a month. Eats deli meats that have some salt   Non-smoker Drinks lots of beer -  4-5 a day.  More on the weekend.   April 17, 2016:;     Doing well. Walks every day - several miles No CP or dyspnea.  Some fatigue climbing a hill  Nov. 15, 2017:  Joseph Foster is seen Today for his combined systolic and diastolic congestive heart failure. Heart catheterization on 04/29/2016 reveals mild LAD stenosis. His LV systolic function was normal. He has grade 1 diastolic dysfunction by echo   Aug. 20, 2018:  Doing well.    Walks 5 days a week.  Rides his motorcycle on the weekends. EF is 45-50% Avoids salt.   Feb. 4, 2019:  Doing well.  Tolerating the Entresto BP is low.     Gets dizzy after drinking several beers.    Sept. 24, 2019: Joseph Foster is seen back today for follow-up of his chronic combined systolic and diastolic congestive heart failure: Echocardiogram in 2015 revealed an ejection fraction of 45%.  Most recent echocardiogram in August, 2019 reveals normal left ventricular systolic function with an EF of 55%.  He has grade 1 diastolic dysfunction.  Walks daily ,  No CP or dyspnea.   Jan. 27, 2021;  Joseph Foster is seen back today for follow-up of his chronic combined systolic and diastolic congestive heart  failure. He was last seen 2 years ago. breathinig is ok Has some orthostatic hypotension .   Seems to have improved.  Nov. 18, 2021: Joseph Foster is seen back for follow up of his chronic combined CHF. Is having some orthostatic hypotension orthostasis resolved when he switched his tamulosin from daytime to night .  Breathing is good Work out in gym 3 times a week.  injurred his right foot  Labs from oct. Were reviewed.  Trigs are 225.  We discussed limiting his carbs   Past Medical History:  Diagnosis Date  . Acute medial meniscus tear of left knee   . Anxiety   . Aortic dilatation (HCC) 05/12/2018   Mild, noted on ECHO  . Arthritis KNEES  . Chronic back pain   . Chronic combined systolic and diastolic heart failure ([place])   . COPD (chronic obstructive pulmonary disease) (HCC)    asymptomatic, denies shortness of breath  . DDD (degenerative disc disease), lumbar L4  . Depression   . GERD (gastroesophageal reflux disease)   . Grade I diastolic dysfunction 01/75/1025   Noted on ECHO  . Hyperlipidemia   . Hypertension   . Insomnia   . LAD stenosis 03/2016   Mild, noted on heart catheterization  . 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   Noted  on EKG  . Lung nodule 2010   Left lung base nodule  . Memory loss   . Numbness and tingling in both hands    while riding motorcycle  . Numbness and tingling of both feet    pinched nerve  . Pneumonia    age 21's  . Prostate cancer ([place])   . Ringing in ears    Resolved  . TIA (transient ischemic attack) 2015    Past Surgical History:  Procedure Laterality Date  . CARDIAC CATHETERIZATION N/A 04/29/2016   Procedure: Left Heart Cath and Coronary Angiography;  Surgeon: Leonie Man, MD;  Location: [facility];  Service: Cardiovascular;  Laterality: N/A;  . COLONOSCOPY    . CYSTOSCOPY N/A 03/15/2019   Procedure: CYSTOSCOPY;  Surgeon: Kathie Rhodes, MD;  Location: [facility];  Service: Urology;  Laterality:  N/A;  2 seeds detected and removed by Dr. Karsten Ro  . HC EEG 41-60MINS  08/23/2014      . KNEE ARTHROSCOPY  01/15/2012   Procedure: ARTHROSCOPY KNEE;  Surgeon: Gearlean Alf, MD;  Location: [facility];  Service: Orthopedics;  Laterality: Left;  meniscal debridement  . RADIOACTIVE SEED IMPLANT N/A 03/15/2019   Procedure: RADIOACTIVE SEED IMPLANT/BRACHYTHERAPY IMPLANT;  Surgeon: Kathie Rhodes, MD;  Location: [facility];  Service: Urology;  Laterality: N/A;  81 seeds  . RIGHT KNEE SURG.  2005  . SPINAL CORD STIMULATOR IMPLANT  2009  . TONSILECTOMY/ADENOIDECTOMY WITH MYRINGOTOMY    . TOTAL KNEE ARTHROPLASTY Right 12/26/2014   Procedure: TOTAL RIGHT  KNEE ARTHROPLASTY;  Surgeon: Gaynelle Arabian, MD;  Location: [facility];  Service: Orthopedics;  Laterality: Right;  . UPPER GI ENDOSCOPY    . VASECTOMY       Current Outpatient Medications  Medication Sig Dispense Refill  . ALPRAZolam (XANAX) 0.5 MG tablet Take 0.5 mg by mouth 2 (two) times daily as needed for anxiety.     [name] aspirin EC 81 MG tablet Take 1 tablet (81 mg total) by mouth daily.    . carvedilol (COREG) 3.125 MG tablet TAKE 1 TABLET BY MOUTH TWICE A DAY 180 tablet 1  . ENTRESTO 24-26 MG TAKE 1 TABLET BY MOUTH TWICE A DAY 180 tablet 3  . esomeprazole (NEXIUM) 20 MG capsule Take 20 mg by mouth as needed.    . Multiple Vitamin (MULTIVITAMIN) tablet Take 1 tablet by mouth daily.    [name] PARoxetine (PAXIL) 40 MG tablet Take 40 mg by mouth every morning.    . pravastatin (PRAVACHOL) 20 MG tablet Take 20 mg by mouth daily.    . tamsulosin (FLOMAX) 0.4 MG CAPS capsule Take 0.4 mg by mouth daily.     No current facility-administered medications for this visit.    Allergies:   Codeine, Dilaudid [hydromorphone hcl], Penicillins, Simvastatin, Ceclor [cefaclor], Oxycontin [oxycodone hcl], and Sulfa antibiotics    Social History:  The patient  reports that he has been smoking cigars. He has a 5.00 pack-year smoking  history. He has quit using smokeless tobacco.  His smokeless tobacco use included snuff. He reports current alcohol use of about 21.0 standard drinks of alcohol per week. He reports current drug use. Drug: Marijuana.   Family History:  The patient's family history includes Arthritis in his mother; Heart disease in his father; Hyperlipidemia in his brother and mother; Leukemia in his mother; Lung cancer in his father.    ROS:   See current history.  All other systems are negative  Physical Exam: Blood pressure 122/90, pulse 81, height 6\' 2"  (1.88 m), weight 258 lb 9.6 oz (117.3 kg), SpO2 96 %.  GEN:  Well nourished, well developed in no acute distress HEENT: Normal NECK: No JVD; No carotid bruits LYMPHATICS: No lymphadenopathy CARDIAC: RRR , no murmurs, rubs, gallops RESPIRATORY:  Clear to auscultation without rales, wheezing or rhonchi  ABDOMEN: Soft, non-tender, non-distended MUSCULOSKELETAL:  No edema; No deformity  SKIN: Warm and dry NEUROLOGIC:  Alert and oriented x 3   EKG: August 18, 2020: Normal sinus rhythm at 81.  No ST or T wave changes.  Recent Labs: No results found for requested labs within last 8760 hours.    Lipid Panel    Component Value Date/Time   CHOL 165 08/23/2014 0618   TRIG 131 08/23/2014 0618   HDL 51 08/23/2014 0618   CHOLHDL 3.2 08/23/2014 0618   VLDL 26 08/23/2014 0618   LDLCALC 88 08/23/2014 0618      Wt Readings from Last 3 Encounters:  08/18/20 258 lb 9.6 oz (117.3 kg)  04/28/20 (!) 260 lb (117.9 kg)  10/27/19 267 lb 6.4 oz (121.3 kg)      Other studies Reviewed: Additional studies/ records that were reviewed today include: . Review of the above records demonstrates:    ASSESSMENT AND PLAN:  1. Essential hypertension -  BP is well controlled.    2. Chronic  Combined systolic and diastolic congestive heart failure-  No symptoms of dyspnea His EF has improved from 45 % to 50 %.  Cont current meds.    3. COPD  4.  Anxiety/depression  5. Hyperlipidemia - trigs are elevated.   Advised him to avoid foods that are white, wheat, and sweat   6.  TIA -      Mertie Moores, MD  08/18/2020 9:52 AM    [facility] [place], [place], [place]  [address] Phone: [phone]; Fax: [phone]

## 2020-08-18 ENCOUNTER — Encounter: Payer: Self-pay | Admitting: Cardiovascular Disease

## 2020-08-18 ENCOUNTER — Other Ambulatory Visit: Payer: Self-pay

## 2020-08-18 ENCOUNTER — Ambulatory Visit (INDEPENDENT_AMBULATORY_CARE_PROVIDER_SITE_OTHER): Payer: Medicare Other | Admitting: Cardiovascular Disease

## 2020-08-18 VITALS — BP 122/90 | HR 81 | Ht 74.0 in | Wt 258.6 lb

## 2020-08-18 DIAGNOSIS — I5042 Chronic combined systolic (congestive) and diastolic (congestive) heart failure: Secondary | ICD-10-CM | POA: Diagnosis not present

## 2020-08-18 DIAGNOSIS — I10 Essential (primary) hypertension: Secondary | ICD-10-CM

## 2020-08-18 NOTE — Patient Instructions (Signed)

## 2020-09-20 ENCOUNTER — Other Ambulatory Visit: Payer: Self-pay | Admitting: Orthopedic Surgery

## 2020-09-20 ENCOUNTER — Telehealth: Payer: Self-pay

## 2020-09-20 DIAGNOSIS — M545 Low back pain, unspecified: Secondary | ICD-10-CM

## 2020-09-20 NOTE — Telephone Encounter (Signed)
Phone call to patient to verify medication list and allergies for myelogram procedure. Pt instructed to hold Paxil for 48hrs prior to myelogram appointment time and 24 hours after appointment. Pt also instructed to have a driver the day of the procedure, the procedure would take around 2 hours, and discharge instructions discussed. Pt verbalized understanding.   

## 2020-10-03 ENCOUNTER — Other Ambulatory Visit: Payer: Medicare Other

## 2020-10-03 ENCOUNTER — Inpatient Hospital Stay
Admission: RE | Admit: 2020-10-03 | Discharge: 2020-10-03 | Disposition: A | Discharge status: Home / Self Care | Payer: Medicare Other | Source: Ambulatory Visit | Attending: Orthopedic Surgery | Admitting: Orthopedic Surgery

## 2020-10-03 NOTE — Discharge Instructions (Signed)
Myelogram Discharge Instructions  1. Go home and rest quietly for the next 24 hours.  It is important to lie flat for the next 24 hours.  Get up only to go to the restroom.  You may lie in the bed or on a couch on your back, your stomach, your left side or your right side.  You may have one pillow under your head.  You may have pillows between your knees while you are on your side or under your knees while you are on your back.  2. DO NOT drive today.  Recline the seat as far back as it will go, while still wearing your seat belt, on the way home.  3. You may get up to go to the bathroom as needed.  You may sit up for 10 minutes to eat.  You may resume your normal diet and medications unless otherwise indicated.  Drink lots of extra fluids today and tomorrow.  4. The incidence of headache, nausea, or vomiting is about 5% (one in 20 patients).  If you develop a headache, lie flat and drink plenty of fluids until the headache goes away.  Caffeinated beverages may be helpful.  If you develop severe nausea and vomiting or a headache that does not go away with flat bed rest, call [phone].  5. You may resume normal activities after your 24 hours of bed rest is over; however, do not exert yourself strongly or do any heavy lifting tomorrow. If when you get up you have a headache when standing, go back to bed and force fluids for another 24 hours.  6. Call your physician for a follow-up appointment.  The results of your myelogram will be sent directly to your physician by the following day.  7. If you have any questions or if complications develop after you arrive home, please call [phone].  Discharge instructions have been explained to the patient.  The patient, or the person responsible for the patient, fully understands these instructions  YOU MAY TAKE YOUR PAXIL TOMORROW ON 10/04/20 @ 9:30AM.

## 2020-10-06 ENCOUNTER — Other Ambulatory Visit: Payer: Self-pay

## 2020-10-06 ENCOUNTER — Ambulatory Visit
Admission: RE | Admit: 2020-10-06 | Discharge: 2020-10-06 | Disposition: A | Discharge status: Home / Self Care | Payer: Medicare Other | Source: Ambulatory Visit | Attending: Orthopedic Surgery | Admitting: Orthopedic Surgery

## 2020-10-06 DIAGNOSIS — G8929 Other chronic pain: Secondary | ICD-10-CM

## 2020-10-06 DIAGNOSIS — M545 Low back pain, unspecified: Secondary | ICD-10-CM

## 2020-10-06 MED ORDER — IOPAMIDOL (ISOVUE-M 200) INJECTION 41%
15.0000 mL | Freq: Once | INTRAMUSCULAR | Status: AC
  Administered 2020-10-06: 15 mL via INTRATHECAL

## 2020-10-06 MED ORDER — DIAZEPAM 5 MG PO TABS
5.0000 mg | ORAL_TABLET | Freq: Once | ORAL | Status: AC
  Administered 2020-10-06: 5 mg via ORAL

## 2020-10-06 NOTE — Discharge Instructions (Signed)
Myelogram Discharge Instructions  1. Go home and rest quietly for the next 24 hours.  It is important to lie flat for the next 24 hours.  Get up only to go to the restroom.  You may lie in the bed or on a couch on your back, your stomach, your left side or your right side.  You may have one pillow under your head.  You may have pillows between your knees while you are on your side or under your knees while you are on your back.  2. DO NOT drive today.  Recline the seat as far back as it will go, while still wearing your seat belt, on the way home.  3. You may get up to go to the bathroom as needed.  You may sit up for 10 minutes to eat.  You may resume your normal diet and medications unless otherwise indicated.  Drink lots of extra fluids today and tomorrow.  4. The incidence of headache, nausea, or vomiting is about 5% (one in 20 patients).  If you develop a headache, lie flat and drink plenty of fluids until the headache goes away.  Caffeinated beverages may be helpful.  If you develop severe nausea and vomiting or a headache that does not go away with flat bed rest, call [phone].  5. You may resume normal activities after your 24 hours of bed rest is over; however, do not exert yourself strongly or do any heavy lifting tomorrow. If when you get up you have a headache when standing, go back to bed and force fluids for another 24 hours.  6. Call your physician for a follow-up appointment.  The results of your myelogram will be sent directly to your physician by the following day.  7. If you have any questions or if complications develop after you arrive home, please call [phone].  Discharge instructions have been explained to the patient.  The patient, or the person responsible for the patient, fully understands these instructions  YOU MAY TAKE YOUR PAXIL TOMORROW ON 10/07/20 @ 9:30AM OR THEREAFTER.

## 2020-10-06 NOTE — Progress Notes (Signed)
Pt reports he has been off of his Paxil at least 48 hours.

## 2020-10-11 ENCOUNTER — Other Ambulatory Visit: Payer: Self-pay | Admitting: Cardiovascular Disease

## 2020-11-29 ENCOUNTER — Other Ambulatory Visit: Payer: Self-pay | Admitting: Physician Assistant

## 2020-11-29 ENCOUNTER — Inpatient Hospital Stay: Admission: RE | Admit: 2020-11-29 | Payer: Medicare Other | Source: Ambulatory Visit

## 2020-11-29 DIAGNOSIS — M79672 Pain in left foot: Secondary | ICD-10-CM

## 2020-12-01 ENCOUNTER — Other Ambulatory Visit (HOSPITAL_COMMUNITY): Payer: Self-pay | Admitting: Orthopedic Surgery

## 2020-12-01 ENCOUNTER — Telehealth: Payer: Self-pay

## 2020-12-01 ENCOUNTER — Other Ambulatory Visit: Payer: Self-pay

## 2020-12-01 ENCOUNTER — Encounter (HOSPITAL_BASED_OUTPATIENT_CLINIC_OR_DEPARTMENT_OTHER): Payer: Self-pay | Admitting: Orthopedic Surgery

## 2020-12-01 NOTE — Telephone Encounter (Signed)
   [facility] Pre-operative Risk Assessment    HEARTCARE STAFF: - Please ensure there is not already an duplicate clearance open for this procedure. - Under Visit Info/Reason for Call, type in Other and utilize the format Clearance MM/DD/YY or Clearance TBD. Do not use dashes or single digits. - If request is for dental extraction, please clarify the # of teeth to be extracted.  Request for surgical clearance:  1. What type of surgery is being performed? ORIF LF ANKLE/FOOT FRACTURE   2. When is this surgery scheduled? 12/07/20   3. What type of clearance is required (medical clearance vs. Pharmacy clearance to hold med vs. Both)? BOTH  4. Are there any medications that need to be held prior to surgery and how long? ASA NEEDS INSTRUCTIONS    5. Practice name and name of physician performing surgery? [facility], DR. Jenny Reichmann [facility]   6. What is the office phone number? [phone]   7.   What is the office fax number? [place]   Anesthesia type (None, local, MAC, general) ? GENERAL   Jacinta Shoe 12/01/2020, 11:32 AM  _________________________________________________________________   (provider comments below)

## 2020-12-01 NOTE — Telephone Encounter (Signed)
   Primary Cardiologist: Mertie Moores, MD  Chart reviewed as part of pre-operative protocol coverage. Patient was contacted 12/01/2020 in reference to pre-operative risk assessment for pending surgery as outlined below.  Joseph Foster was last seen on 08/18/20 by Dr. Acie Fredrickson.  Since that day, BAKARI NIKOLAI has done well.  Therefore, based on ACC/AHA guidelines, the patient would be at acceptable risk for the planned procedure without further cardiovascular testing.   He was previously on Aspirin due to history of TIA. Tells me he is no longer taking. Encouraged to discuss with his PCP at next visit.   The patient was advised that if he develops new symptoms prior to surgery to contact our office to arrange for a follow-up visit, and he verbalized understanding.  I will route this recommendation to the requesting party via Epic fax function and remove from pre-op pool. Please call with questions.  Loel Dubonnet, NP 12/01/2020, 12:45 PM

## 2020-12-02 ENCOUNTER — Other Ambulatory Visit: Payer: Self-pay | Admitting: Cardiovascular Disease

## 2020-12-04 ENCOUNTER — Other Ambulatory Visit (HOSPITAL_COMMUNITY)
Admission: RE | Admit: 2020-12-04 | Discharge: 2020-12-04 | Disposition: A | Discharge status: Home / Self Care | Payer: Medicare Other | Source: Ambulatory Visit | Attending: Orthopedic Surgery | Admitting: Orthopedic Surgery

## 2020-12-04 DIAGNOSIS — Z20822 Contact with and (suspected) exposure to covid-19: Secondary | ICD-10-CM | POA: Diagnosis not present

## 2020-12-04 DIAGNOSIS — Z01812 Encounter for preprocedural laboratory examination: Secondary | ICD-10-CM | POA: Insufficient documentation

## 2020-12-04 LAB — SARS CORONAVIRUS 2 (TAT 6-24 HRS): SARS Coronavirus 2: NEGATIVE

## 2020-12-07 ENCOUNTER — Ambulatory Visit (HOSPITAL_BASED_OUTPATIENT_CLINIC_OR_DEPARTMENT_OTHER): Payer: Medicare Other | Admitting: Anesthesiology

## 2020-12-07 ENCOUNTER — Encounter (HOSPITAL_BASED_OUTPATIENT_CLINIC_OR_DEPARTMENT_OTHER)
Admission: RE | Disposition: A | Discharge status: Home / Self Care | Payer: Self-pay | Source: Home / Self Care | Attending: Orthopedic Surgery

## 2020-12-07 ENCOUNTER — Encounter (HOSPITAL_BASED_OUTPATIENT_CLINIC_OR_DEPARTMENT_OTHER): Payer: Self-pay | Admitting: Orthopedic Surgery

## 2020-12-07 ENCOUNTER — Other Ambulatory Visit: Payer: Self-pay

## 2020-12-07 ENCOUNTER — Ambulatory Visit (HOSPITAL_BASED_OUTPATIENT_CLINIC_OR_DEPARTMENT_OTHER)
Admission: RE | Admit: 2020-12-07 | Discharge: 2020-12-07 | Disposition: A | Discharge status: Home / Self Care | Payer: Medicare Other | Attending: Orthopedic Surgery | Admitting: Orthopedic Surgery

## 2020-12-07 DIAGNOSIS — Z87891 Personal history of nicotine dependence: Secondary | ICD-10-CM | POA: Diagnosis not present

## 2020-12-07 DIAGNOSIS — Z885 Allergy status to narcotic agent status: Secondary | ICD-10-CM | POA: Diagnosis not present

## 2020-12-07 DIAGNOSIS — S93432A Sprain of tibiofibular ligament of left ankle, initial encounter: Secondary | ICD-10-CM | POA: Diagnosis not present

## 2020-12-07 DIAGNOSIS — Z880 Allergy status to penicillin: Secondary | ICD-10-CM | POA: Insufficient documentation

## 2020-12-07 DIAGNOSIS — S92322A Displaced fracture of second metatarsal bone, left foot, initial encounter for closed fracture: Secondary | ICD-10-CM | POA: Insufficient documentation

## 2020-12-07 DIAGNOSIS — S8262XA Displaced fracture of lateral malleolus of left fibula, initial encounter for closed fracture: Secondary | ICD-10-CM | POA: Diagnosis not present

## 2020-12-07 DIAGNOSIS — W19XXXA Unspecified fall, initial encounter: Secondary | ICD-10-CM | POA: Insufficient documentation

## 2020-12-07 DIAGNOSIS — Z881 Allergy status to other antibiotic agents status: Secondary | ICD-10-CM | POA: Insufficient documentation

## 2020-12-07 DIAGNOSIS — Z79899 Other long term (current) drug therapy: Secondary | ICD-10-CM | POA: Diagnosis not present

## 2020-12-07 DIAGNOSIS — Z882 Allergy status to sulfonamides status: Secondary | ICD-10-CM | POA: Insufficient documentation

## 2020-12-07 DIAGNOSIS — Z8546 Personal history of malignant neoplasm of prostate: Secondary | ICD-10-CM | POA: Insufficient documentation

## 2020-12-07 HISTORY — PX: ORIF ANKLE FRACTURE: SHX5408

## 2020-12-07 HISTORY — DX: Other fracture of left lower leg, initial encounter for closed fracture: S82.892A

## 2020-12-07 SURGERY — OPEN REDUCTION INTERNAL FIXATION (ORIF) ANKLE FRACTURE
Anesthesia: General | Site: Ankle | Laterality: Left

## 2020-12-07 MED ORDER — CEFAZOLIN SODIUM-DEXTROSE 2-4 GM/100ML-% IV SOLN
INTRAVENOUS | Status: AC
  Filled 2020-12-07: qty 100

## 2020-12-07 MED ORDER — MIDAZOLAM HCL 2 MG/2ML IJ SOLN
INTRAMUSCULAR | Status: AC
  Filled 2020-12-07: qty 2

## 2020-12-07 MED ORDER — SODIUM CHLORIDE 0.9 % IV SOLN: INTRAVENOUS | Status: DC

## 2020-12-07 MED ORDER — VANCOMYCIN HCL 500 MG IV SOLR
INTRAVENOUS | Status: DC | PRN
  Administered 2020-12-07: 500 mg

## 2020-12-07 MED ORDER — FENTANYL CITRATE (PF) 100 MCG/2ML IJ SOLN
INTRAMUSCULAR | Status: AC
  Filled 2020-12-07: qty 2

## 2020-12-07 MED ORDER — MEPERIDINE HCL 25 MG/ML IJ SOLN: 6.2500 mg | INTRAMUSCULAR | Status: DC | PRN

## 2020-12-07 MED ORDER — ACETAMINOPHEN 325 MG PO TABS
325.0000 mg | ORAL_TABLET | ORAL | Status: DC | PRN
Start: 2020-12-07 — End: 2020-12-07

## 2020-12-07 MED ORDER — FENTANYL CITRATE (PF) 100 MCG/2ML IJ SOLN
25.0000 ug | INTRAMUSCULAR | Status: DC | PRN
  Administered 2020-12-07: 50 ug via INTRAVENOUS
  Administered 2020-12-07: 25 ug via INTRAVENOUS

## 2020-12-07 MED ORDER — LIDOCAINE HCL (CARDIAC) PF 100 MG/5ML IV SOSY
PREFILLED_SYRINGE | INTRAVENOUS | Status: DC | PRN
  Administered 2020-12-07: 50 mg via INTRAVENOUS

## 2020-12-07 MED ORDER — BUPIVACAINE LIPOSOME 1.3 % IJ SUSP
INTRAMUSCULAR | Status: DC | PRN
Start: 2020-12-07 — End: 2020-12-07
  Administered 2020-12-07 (×2): 10 mL via PERINEURAL

## 2020-12-07 MED ORDER — ONDANSETRON HCL 4 MG/2ML IJ SOLN
INTRAMUSCULAR | Status: DC | PRN
  Administered 2020-12-07: 4 mg via INTRAVENOUS

## 2020-12-07 MED ORDER — LACTATED RINGERS IV SOLN: INTRAVENOUS | Status: DC

## 2020-12-07 MED ORDER — OXYCODONE HCL 5 MG PO TABS: 5.0000 mg | ORAL_TABLET | Freq: Four times a day (QID) | ORAL | 0 refills | Status: AC | PRN

## 2020-12-07 MED ORDER — FENTANYL CITRATE (PF) 100 MCG/2ML IJ SOLN
100.0000 ug | Freq: Once | INTRAMUSCULAR | Status: AC
  Administered 2020-12-07: 100 ug via INTRAVENOUS

## 2020-12-07 MED ORDER — BUPIVACAINE-EPINEPHRINE (PF) 0.25% -1:200000 IJ SOLN
INTRAMUSCULAR | Status: DC | PRN
  Administered 2020-12-07 (×2): 10 mL via PERINEURAL

## 2020-12-07 MED ORDER — OXYCODONE HCL 5 MG/5ML PO SOLN: 5.0000 mg | Freq: Once | ORAL | Status: DC | PRN

## 2020-12-07 MED ORDER — ACETAMINOPHEN 160 MG/5ML PO SOLN: 325.0000 mg | ORAL | Status: DC | PRN

## 2020-12-07 MED ORDER — CEFAZOLIN SODIUM-DEXTROSE 2-4 GM/100ML-% IV SOLN
2.0000 g | INTRAVENOUS | Status: AC
  Administered 2020-12-07: 2 g via INTRAVENOUS

## 2020-12-07 MED ORDER — OXYCODONE HCL 5 MG PO TABS: 5.0000 mg | ORAL_TABLET | Freq: Once | ORAL | Status: DC | PRN

## 2020-12-07 MED ORDER — MIDAZOLAM HCL 2 MG/2ML IJ SOLN
2.0000 mg | Freq: Once | INTRAMUSCULAR | Status: AC
  Administered 2020-12-07: 2 mg via INTRAVENOUS

## 2020-12-07 MED ORDER — ONDANSETRON HCL 4 MG/2ML IJ SOLN: 4.0000 mg | Freq: Once | INTRAMUSCULAR | Status: DC | PRN

## 2020-12-07 MED ORDER — PROPOFOL 10 MG/ML IV BOLUS
INTRAVENOUS | Status: DC | PRN
  Administered 2020-12-07: 200 mg via INTRAVENOUS

## 2020-12-07 SURGICAL SUPPLY — 75 items
APL PRP STRL LF DISP 70% ISPRP (MISCELLANEOUS) ×1
BANDAGE ESMARK 6X9 LF (GAUZE/BANDAGES/DRESSINGS) IMPLANT
BIT DRILL 2.5X2.75 QC CALB (BIT) ×1 IMPLANT
BLADE SURG 15 STRL LF DISP TIS (BLADE) ×2 IMPLANT
BLADE SURG 15 STRL SS (BLADE) ×4
BNDG CMPR 9X4 STRL LF SNTH (GAUZE/BANDAGES/DRESSINGS)
BNDG CMPR 9X6 STRL LF SNTH (GAUZE/BANDAGES/DRESSINGS)
BNDG COHESIVE 4X5 TAN STRL (GAUZE/BANDAGES/DRESSINGS) ×2 IMPLANT
BNDG COHESIVE 6X5 TAN STRL LF (GAUZE/BANDAGES/DRESSINGS) ×2 IMPLANT
BNDG ESMARK 4X9 LF (GAUZE/BANDAGES/DRESSINGS) IMPLANT
BNDG ESMARK 6X9 LF (GAUZE/BANDAGES/DRESSINGS)
CANISTER SUCT 1200ML W/VALVE (MISCELLANEOUS) ×2 IMPLANT
CHLORAPREP W/TINT 26 (MISCELLANEOUS) ×2 IMPLANT
COVER BACK TABLE 60X90IN (DRAPES) ×2 IMPLANT
COVER WAND RF STERILE (DRAPES) IMPLANT
CUFF TOURN SGL QUICK 34 (TOURNIQUET CUFF) ×2
CUFF TRNQT CYL 34X4.125X (TOURNIQUET CUFF) IMPLANT
DECANTER SPIKE VIAL GLASS SM (MISCELLANEOUS) IMPLANT
DRAPE EXTREMITY T 121X128X90 (DISPOSABLE) ×2 IMPLANT
DRAPE OEC MINIVIEW 54X84 (DRAPES) ×2 IMPLANT
DRAPE U-SHAPE 47X51 STRL (DRAPES) ×2 IMPLANT
DRSG MEPITEL 4X7.2 (GAUZE/BANDAGES/DRESSINGS) ×2 IMPLANT
DRSG PAD ABDOMINAL 8X10 ST (GAUZE/BANDAGES/DRESSINGS) ×4 IMPLANT
ELECT REM PT RETURN 9FT ADLT (ELECTROSURGICAL) ×2
ELECTRODE REM PT RTRN 9FT ADLT (ELECTROSURGICAL) ×1 IMPLANT
FIXATION ZIPTIGHT ANKLE SNDSMS (Ankle) IMPLANT
GAUZE SPONGE 4X4 12PLY STRL (GAUZE/BANDAGES/DRESSINGS) ×2 IMPLANT
GLOVE SRG 8 PF TXTR STRL LF DI (GLOVE) ×2 IMPLANT
GLOVE SURG ENC MOIS LTX SZ8 (GLOVE) ×2 IMPLANT
GLOVE SURG LTX SZ8 (GLOVE) ×2 IMPLANT
GLOVE SURG POLYISO LF SZ7 (GLOVE) ×1 IMPLANT
GLOVE SURG UNDER POLY LF SZ7 (GLOVE) ×1 IMPLANT
GLOVE SURG UNDER POLY LF SZ8 (GLOVE) ×2
GOWN STRL REUS W/ TWL LRG LVL3 (GOWN DISPOSABLE) ×1 IMPLANT
GOWN STRL REUS W/ TWL XL LVL3 (GOWN DISPOSABLE) ×2 IMPLANT
GOWN STRL REUS W/TWL LRG LVL3 (GOWN DISPOSABLE) ×2
GOWN STRL REUS W/TWL XL LVL3 (GOWN DISPOSABLE) ×2
NEEDLE HYPO 22GX1.5 SAFETY (NEEDLE) IMPLANT
NS IRRIG 1000ML POUR BTL (IV SOLUTION) ×2 IMPLANT
PACK BASIN DAY SURGERY FS (CUSTOM PROCEDURE TRAY) ×2 IMPLANT
PAD CAST 4YDX4 CTTN HI CHSV (CAST SUPPLIES) ×1 IMPLANT
PADDING CAST ABS 4INX4YD NS (CAST SUPPLIES)
PADDING CAST ABS COTTON 4X4 ST (CAST SUPPLIES) IMPLANT
PADDING CAST COTTON 4X4 STRL (CAST SUPPLIES) ×2
PADDING CAST COTTON 6X4 STRL (CAST SUPPLIES) ×2 IMPLANT
PENCIL SMOKE EVACUATOR (MISCELLANEOUS) ×2 IMPLANT
PLATE ACE 100DEG 8HOLE (Plate) ×1 IMPLANT
SANITIZER HAND PURELL 535ML FO (MISCELLANEOUS) ×2 IMPLANT
SCREW CORTICAL 3.5MM  12MM (Screw) ×2 IMPLANT
SCREW CORTICAL 3.5MM  16MM (Screw) ×6 IMPLANT
SCREW CORTICAL 3.5MM 12MM (Screw) IMPLANT
SCREW CORTICAL 3.5MM 14MM (Screw) ×3 IMPLANT
SCREW CORTICAL 3.5MM 16MM (Screw) IMPLANT
SHEET MEDIUM DRAPE 40X70 STRL (DRAPES) ×2 IMPLANT
SLEEVE SCD COMPRESS KNEE MED (STOCKING) ×2 IMPLANT
SPLINT FAST PLASTER 5X30 (CAST SUPPLIES) ×20
SPLINT PLASTER CAST FAST 5X30 (CAST SUPPLIES) ×20 IMPLANT
SPONGE LAP 18X18 RF (DISPOSABLE) ×2 IMPLANT
STOCKINETTE 6  STRL (DRAPES) ×2
STOCKINETTE 6 STRL (DRAPES) ×1 IMPLANT
SUCTION FRAZIER HANDLE 10FR (MISCELLANEOUS) ×2
SUCTION TUBE FRAZIER 10FR DISP (MISCELLANEOUS) ×1 IMPLANT
SUT ETHILON 3 0 PS 1 (SUTURE) ×2 IMPLANT
SUT FIBERWIRE #2 38 T-5 BLUE (SUTURE)
SUT MNCRL AB 3-0 PS2 18 (SUTURE) ×1 IMPLANT
SUT VIC AB 0 SH 27 (SUTURE) IMPLANT
SUT VIC AB 2-0 SH 27 (SUTURE) ×2
SUT VIC AB 2-0 SH 27XBRD (SUTURE) ×1 IMPLANT
SUTURE FIBERWR #2 38 T-5 BLUE (SUTURE) IMPLANT
SYR BULB EAR ULCER 3OZ GRN STR (SYRINGE) ×2 IMPLANT
SYR CONTROL 10ML LL (SYRINGE) IMPLANT
TOWEL GREEN STERILE FF (TOWEL DISPOSABLE) ×4 IMPLANT
TUBE CONNECTING 20X1/4 (TUBING) ×2 IMPLANT
UNDERPAD 30X36 HEAVY ABSORB (UNDERPADS AND DIAPERS) ×2 IMPLANT
ZIPTIGHT ANKLE SYNODESMOSS FIX (Ankle) ×2 IMPLANT

## 2020-12-07 NOTE — Anesthesia Procedure Notes (Signed)
Anesthesia Regional Block: Adductor canal block   Pre-Anesthetic Checklist: ,, timeout performed, Correct Patient, Correct Site, Correct Laterality, Correct Procedure, Correct Position, site marked, Risks and benefits discussed,  Surgical consent,  Pre-op evaluation,  At surgeon's request and post-op pain management  Laterality: Left  Prep: chloraprep       Needles:  Injection technique: Single-shot  Needle Type: Echogenic Stimulator Needle     Needle Length: 5cm  Needle Gauge: 22     Additional Needles:   Procedures:, nerve stimulator,,, ultrasound used (permanent image in chart),,,,   Nerve Stimulator or Paresthesia:  Response: quadraceps contraction, 0.45 mA,   Additional Responses:   Narrative:  Start time: 12/07/2020 11:55 AM End time: 12/07/2020 12:00 PM Injection made incrementally with aspirations every 5 mL.  Performed by: Personally  Anesthesiologist: Janeece Riggers, MD  Additional Notes: Functioning IV was confirmed and monitors were applied.  A 21mm 22ga Arrow echogenic stimulator needle was used. Sterile prep and drape,hand hygiene and sterile gloves were used. Ultrasound guidance: relevant anatomy identified, needle position confirmed, local anesthetic spread visualized around nerve(s)., vascular puncture avoided.  Image printed for medical record. Negative aspiration and negative test dose prior to incremental administration of local anesthetic. The patient tolerated the procedure well.
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
## 2020-12-07 NOTE — Anesthesia Procedure Notes (Signed)
Procedure Name: LMA Insertion Performed by: Shanya Ferriss, Ernesta Amble, CRNA Pre-anesthesia Checklist: Patient identified, Emergency Drugs available, Suction available and Patient being monitored Patient Re-evaluated:Patient Re-evaluated prior to induction Oxygen Delivery Method: Circle System Utilized Preoxygenation: Pre-oxygenation with 100% oxygen Induction Type: IV induction Ventilation: Mask ventilation without difficulty LMA: LMA inserted LMA Size: 4.0 Number of attempts: 1 Airway Equipment and Method: bite block Placement Confirmation: positive ETCO2 Tube secured with: Tape Dental Injury: Teeth and Oropharynx as per pre-operative assessment

## 2020-12-07 NOTE — Discharge Instructions (Signed)
Joseph Andrell Bergeson, MD [facility]  Please read the following information regarding your care after surgery.  Medications  You only need a prescription for the narcotic pain medicine (ex. oxycodone, Percocet, Norco).  All of the other medicines listed below are available over the counter. X Aleve 2 pills twice a day for the first 3 days after surgery. X acetominophen (Tylenol) 650 mg every 4-6 hours as you need for minor to moderate pain X oxycodone as prescribed for severe pain  Narcotic pain medicine (ex. oxycodone, Percocet, Vicodin) will cause constipation.  To prevent this problem, take the following medicines while you are taking any pain medicine. X docusate sodium (Colace) 100 mg twice a day X senna (Senokot) 2 tablets twice a day  X To help prevent blood clots, take a baby aspirin (81 mg) twice a day for two weeks after surgery.  You should also get up every hour while you are awake to move around.    Weight Bearing ? Bear weight when you are able on your operated leg or foot. ? Bear weight only on your operated foot in the post-op shoe. X Do not bear any weight on the operated leg or foot.  Cast / Splint / Dressing X Keep your splint, cast or dressing clean and dry.  Don't put anything (coat hanger, pencil, etc) down inside of it.  If it gets damp, use a hair dryer on the cool setting to dry it.  If it gets soaked, call the office to schedule an appointment for a cast change. ? Remove your dressing 3 days after surgery and cover the incisions with dry dressings.    After your dressing, cast or splint is removed; you may shower, but do not soak or scrub the wound.  Allow the water to run over it, and then gently pat it dry.  Swelling It is normal for you to have swelling where you had surgery.  To reduce swelling and pain, keep your toes above your nose for at least 3 days after surgery.  It may be necessary to keep your foot or leg elevated for several weeks.  If it hurts, it should be  elevated.  Follow Up Call my office at [phone] when you are discharged from the hospital or surgery center to schedule an appointment to be seen two weeks after surgery.  Call my office at [phone] if you develop a fever >101.5 F, nausea, vomiting, bleeding from the surgical site or severe pain.     Post Anesthesia Home Care Instructions  Activity: Get plenty of rest for the remainder of the day. A responsible individual must stay with you for 24 hours following the procedure.  For the next 24 hours, DO NOT: -Drive a car -Operate machinery -Drink alcoholic beverages -Take any medication unless instructed by your physician -Make any legal decisions or sign important papers.  Meals: Start with liquid foods such as gelatin or soup. Progress to regular foods as tolerated. Avoid greasy, spicy, heavy foods. If nausea and/or vomiting occur, drink only clear liquids until the nausea and/or vomiting subsides. Call your physician if vomiting continues.  Special Instructions/Symptoms: Your throat may feel dry or sore from the anesthesia or the breathing tube placed in your throat during surgery. If this causes discomfort, gargle with warm salt water. The discomfort should disappear within 24 hours.  If you had a scopolamine patch placed behind your ear for the management of post- operative nausea and/or vomiting:  1. The medication in the patch is   effective for 72 hours, after which it should be removed.  Wrap patch in a tissue and discard in the trash. Wash hands thoroughly with soap and water. 2. You may remove the patch earlier than 72 hours if you experience unpleasant side effects which may include dry mouth, dizziness or visual disturbances. 3. Avoid touching the patch. Wash your hands with soap and water after contact with the patch.    Information for Discharge Teaching: EXPAREL (bupivacaine liposome injectable suspension)   Your surgeon or anesthesiologist gave you  EXPAREL(bupivacaine) to help control your pain after surgery.   EXPAREL is a local anesthetic that provides pain relief by numbing the tissue around the surgical site.  EXPAREL is designed to release pain medication over time and can control pain for up to 72 hours.  Depending on how you respond to EXPAREL, you may require less pain medication during your recovery.  Possible side effects:  Temporary loss of sensation or ability to move in the area where bupivacaine was injected.  Nausea, vomiting, constipation  Rarely, numbness and tingling in your mouth or lips, lightheadedness, or anxiety may occur.  Call your doctor right away if you think you may be experiencing any of these sensations, or if you have other questions regarding possible side effects.  Follow all other discharge instructions given to you by your surgeon or nurse. Eat a healthy diet and drink plenty of water or other fluids.  If you return to the hospital for any reason within 96 hours following the administration of EXPAREL, it is important for health care providers to know that you have received this anesthetic. A teal colored band has been placed on your arm with the date, time and amount of EXPAREL you have received in order to alert and inform your health care providers. Please leave this armband in place for the full 96 hours following administration, and then you may remove the band.Regional Anesthesia Blocks  1. Numbness or the inability to move the "blocked" extremity may last from 3-48 hours after placement. The length of time depends on the medication injected and your individual response to the medication. If the numbness is not going away after 48 hours, call your surgeon.  2. The extremity that is blocked will need to be protected until the numbness is gone and the  Strength has returned. Because you cannot feel it, you will need to take extra care to avoid injury. Because it may be weak, you may have  difficulty moving it or using it. You may not know what position it is in without looking at it while the block is in effect.  3. For blocks in the legs and feet, returning to weight bearing and walking needs to be done carefully. You will need to wait until the numbness is entirely gone and the strength has returned. You should be able to move your leg and foot normally before you try and bear weight or walk. You will need someone to be with you when you first try to ensure you do not fall and possibly risk injury.  4. Bruising and tenderness at the needle site are common side effects and will resolve in a few days.  5. Persistent numbness or new problems with movement should be communicated to the surgeon or the [facility] [phone] [place] [phone]).

## 2020-12-07 NOTE — Progress Notes (Signed)
Assisted Dr. Ambrose Pancoast with left, ultrasound guided, popliteal, and adductor canal block. Side rails up, monitors on throughout procedure. See vital signs in flow sheet. Tolerated Procedure well.

## 2020-12-07 NOTE — Op Note (Signed)
12/07/2020  3:03 PM  PATIENT:  Joseph Foster  75 y.o. male  PRE-OPERATIVE DIAGNOSIS: 1.  Left ankle displaced lateral malleolus fracture 2.  Left second metatarsal base fracture 3.  Possible left foot Lisfranc injury   POST-OPERATIVE DIAGNOSIS: Same  Procedure(s): 1.  Open treatment of left ankle lateral malleolus fracture with internal fixation 2.  Stress examination of the left ankle under fluoroscopy 3.  Open treatment of left ankle syndesmosis disruption with internal fixation 4.  AP, mortise and lateral radiographs of the left ankle 5.  Stress examination of the left foot under fluoroscopy 6.  Closed treatment of left second metatarsal base fracture without manipulation 7.  AP and lateral radiographs of the left foot  SURGEON:  Wylene Simmer, MD  ASSISTANT: None ANESTHESIA:   General, regional  EBL:  minimal   TOURNIQUET:   Total Tourniquet Time Documented: Thigh (Left) - 35 minutes Total: Thigh (Left) - 35 minutes  COMPLICATIONS:  None apparent  DISPOSITION:  Extubated, awake and stable to recovery.  INDICATION FOR PROCEDURE: The patient is a 75 year old male with a past medical history significant for coronary artery disease and heart failure.  He fell over a week ago injuring his left ankle and foot.  Radiographs reveal a displaced Weber C fracture of the lateral malleolus as well as a second metatarsal base fracture that is not significantly displaced but is immediately adjacent to the Lisfranc ligament.  He presents now for operative treatment of this displaced and unstable left ankle injury.  He will also need stress examination of the left foot and possible fixation of his Lisfranc joint complex.  The risks and benefits of the alternative treatment options have been discussed in detail.  The patient wishes to proceed with surgery and specifically understands risks of bleeding, infection, nerve damage, blood clots, need for additional surgery, amputation and  death.  PROCEDURE IN DETAIL:  After pre operative consent was obtained, and the correct operative site was identified, the patient was brought to the operating room and placed supine on the OR table.  Anesthesia was administered.  Pre-operative antibiotics were administered.  A surgical timeout was taken.  The left lower extremity was prepped and draped in standard sterile fashion with a tourniquet around the thigh.  The extremity was elevated and the tourniquet was inflated to 250 mmHg.  A longitudinal incision was made over the lateral malleolus.  Dissection was carried down through the subcutaneous tissues.  The fracture site was identified.  Care was taken to protect the superficial peroneal nerve anteriorly.  The fracture site was cleaned of all hematoma and irrigated.  The fracture was reduced and held with 2 lobster claw clamps.  An 8 hole one third tubular plate was placed across the fracture site.  It was secured proximally with 3 bicortical screws and centrally with 2 lag screws within the fracture site.  Distally 2 bicortical screws were inserted leaving a hole open.  AP, lateral and mortise radiographs confirmed appropriate reduction of the fibula fracture in appropriate position and length of the screws and plate.  Stress examination was then performed.  Dorsiflexion and external rotation stress was applied to the supinated forefoot.  The medial clear space and syndesmosis were noted to widen significantly.  The decision was made to proceed with syndesmosis fixation.  The syndesmosis was reduced.  All 4 cortices of the distal fibula and tibia were drilled.  A zip tight suture button device was passed through to the medial cortex of the  tibia and toggled appropriately.  The suture button was then tightened while holding the ankle dorsiflexed to neutral.  AP, mortise and lateral radiographs confirmed appropriate reduction of the syndesmosis and appropriate position and length of all hardware.  The  lateral wound was irrigated copiously and sprinkled with vancomycin powder.  Subcutaneous tissues were approximated with Vicryl.  Skin incision was closed with nylon.  Attention was turned to the midfoot.  An AP radiograph was obtained.  Second metatarsal base fracture was identified.  Abduction and abduction stress was applied across the Lisfranc joints under live fluoroscopy.  There was no instability noted.  A lateral radiograph was obtained.  Plantarflexion and dorsiflexion stress was applied with no evidence of instability.  The decision was made to treat the second metatarsal base fracture and closed fashion.  Sterile dressings were applied to the lateral incision.  A well-padded short leg splint was applied.  The tourniquet was released after application of the dressings.  The patient was awakened from anesthesia and transported to the recovery room in stable condition.   FOLLOW UP PLAN: Nonweightbearing on the left lower extremity.  Follow-up in the office in 2 weeks for suture removal and conversion to a short leg cast.  Plan 6 weeks nonweightbearing immobilization.  Aspirin for DVT prophylaxis.   RADIOGRAPHS: AP, mortise and lateral radiographs of the left ankle are obtained intraoperatively.  These show interval reduction fixation of the lateral malleolus fracture and the syndesmosis.  Hardware is appropriately positioned and of the appropriate lengths.  AP and lateral radiographs of the left foot are obtained intraoperatively.  These show stable syndesmosis as well as a second metatarsal base transverse fracture that is extra-articular and acceptably aligned.

## 2020-12-07 NOTE — Anesthesia Procedure Notes (Signed)
Anesthesia Regional Block: Popliteal block   Pre-Anesthetic Checklist: ,, timeout performed, Correct Patient, Correct Site, Correct Laterality, Correct Procedure, Correct Position, site marked, Risks and benefits discussed,  Surgical consent,  Pre-op evaluation,  At surgeon's request and post-op pain management  Laterality: Left  Prep: chloraprep       Needles:  Injection technique: Single-shot  Needle Type: Echogenic Stimulator Needle     Needle Length: 5cm  Needle Gauge: 22     Additional Needles:   Procedures:, nerve stimulator,,, ultrasound used (permanent image in chart),,,,   Nerve Stimulator or Paresthesia:  Response: foot, 0.45 mA,   Additional Responses:   Narrative:  Start time: 12/07/2020 11:50 AM End time: 12/07/2020 11:55 AM Injection made incrementally with aspirations every 5 mL.  Performed by: Personally  Anesthesiologist: Janeece Riggers, MD  Additional Notes: Functioning IV was confirmed and monitors were applied.  A 30mm 22ga Arrow echogenic stimulator needle was used. Sterile prep and drape,hand hygiene and sterile gloves were used. Ultrasound guidance: relevant anatomy identified, needle position confirmed, local anesthetic spread visualized around nerve(s)., vascular puncture avoided.  Image printed for medical record. Negative aspiration and negative test dose prior to incremental administration of local anesthetic. The patient tolerated the procedure well.

## 2020-12-07 NOTE — Anesthesia Preprocedure Evaluation (Addendum)
Anesthesia Evaluation  Patient identified by MRN, date of birth, ID band Patient awake    Reviewed: Allergy & Precautions, H&P , NPO status , Patient's Chart, lab work & pertinent test results  Airway Mallampati: II   Neck ROM: full    Dental   Pulmonary COPD, Current Smoker, former smoker,    breath sounds clear to auscultation       Cardiovascular hypertension, + CAD and +CHF  + dysrhythmias  Rhythm:regular Rate:Normal  Mild aortic dilation. LAFB  ECHO 19 Impressions:  Normal LV systolic function; mild LVE; mildly dilated aortic  root.    Neuro/Psych PSYCHIATRIC DISORDERS Anxiety Depression TIA Neuromuscular disease    GI/Hepatic GERD  ,  Endo/Other  obese  Renal/GU      Musculoskeletal  (+) Arthritis , Osteoarthritis,    Abdominal   Peds  Hematology   Anesthesia Other Findings   Reproductive/Obstetrics                            Anesthesia Physical  Anesthesia Plan  ASA: III  Anesthesia Plan: General   Post-op Pain Management: GA combined w/ Regional for post-op pain   Induction: Intravenous  PONV Risk Score and Plan: 1 and Ondansetron, Dexamethasone, Treatment may vary due to age or medical condition and Midazolam  Airway Management Planned: LMA  Additional Equipment:   Intra-op Plan:   Post-operative Plan: Extubation in OR  Informed Consent: I have reviewed the patients History and Physical, chart, labs and discussed the procedure including the risks, benefits and alternatives for the proposed anesthesia with the patient or authorized representative who has indicated his/her understanding and acceptance.     Dental advisory given  Plan Discussed with: CRNA, Anesthesiologist and Surgeon  Anesthesia Plan Comments: (Discussed both nerve block for pain relief post-op and GA; including NV, sore throat, dental injury, and pulmonary complications)         Anesthesia Quick Evaluation

## 2020-12-07 NOTE — Transfer of Care (Signed)
Immediate Anesthesia Transfer of Care Note  Patient: Joseph Foster  Procedure(s) Performed: OPEN REDUCTION INTERNAL FIXATION (ORIF) left lateral malleolus fracture; stress exam left foot (Left Ankle)  Patient Location: PACU  Anesthesia Type:GA combined with regional for post-op pain  Level of Consciousness: awake, alert , oriented and patient cooperative  Airway & Oxygen Therapy: Patient Spontanous Breathing and Patient connected to face mask oxygen  Post-op Assessment: Report given to RN and Post -op Vital signs reviewed and stable  Post vital signs: Reviewed and stable  Last Vitals:  Vitals Value Taken Time  BP    Temp    Pulse 87 12/07/20 1454  Resp 13 12/07/20 1454  SpO2 100 % 12/07/20 1454  Vitals shown include unvalidated device data.  Last Pain:  Vitals:   12/07/20 1115  TempSrc: Oral  PainSc: 0-No pain      Patients Stated Pain Goal: 4 (54/27/06 2376)  Complications: No complications documented.

## 2020-12-08 NOTE — Anesthesia Postprocedure Evaluation (Signed)
Anesthesia Post Note  Patient: LEYLAND KENNA  Procedure(s) Performed: OPEN REDUCTION INTERNAL FIXATION (ORIF) left lateral malleolus fracture; stress exam left foot (Left Ankle)     Patient location during evaluation: PACU Anesthesia Type: General Level of consciousness: awake and alert Pain management: pain level controlled Vital Signs Assessment: post-procedure vital signs reviewed and stable Respiratory status: spontaneous breathing, nonlabored ventilation, respiratory function stable and patient connected to nasal cannula oxygen Cardiovascular status: blood pressure returned to baseline and stable Postop Assessment: no apparent nausea or vomiting Anesthetic complications: no   No complications documented.  Last Vitals:  Vitals:   12/07/20 1515 12/07/20 1548  BP: 120/84 (!) 147/89  Pulse: 70 90  Resp: 12 15  Temp:  36.5 C  SpO2: 97% 96%    Last Pain:  Vitals:   12/07/20 1548  TempSrc:   PainSc: 2                  Jesiah Yerby

## 2020-12-11 ENCOUNTER — Encounter (HOSPITAL_BASED_OUTPATIENT_CLINIC_OR_DEPARTMENT_OTHER): Payer: Self-pay | Admitting: Orthopedic Surgery

## 2021-10-11 ENCOUNTER — Other Ambulatory Visit: Payer: Self-pay | Admitting: Cardiovascular Disease

## 2021-11-01 ENCOUNTER — Ambulatory Visit (INDEPENDENT_AMBULATORY_CARE_PROVIDER_SITE_OTHER): Payer: Medicare Other | Admitting: Cardiovascular Disease

## 2021-11-01 ENCOUNTER — Other Ambulatory Visit: Payer: Self-pay

## 2021-11-01 ENCOUNTER — Encounter: Payer: Self-pay | Admitting: Cardiovascular Disease

## 2021-11-01 VITALS — BP 132/72 | HR 89 | Ht 74.0 in | Wt 271.0 lb

## 2021-11-01 DIAGNOSIS — I10 Essential (primary) hypertension: Secondary | ICD-10-CM | POA: Diagnosis not present

## 2021-11-01 DIAGNOSIS — I5042 Chronic combined systolic (congestive) and diastolic (congestive) heart failure: Secondary | ICD-10-CM

## 2021-11-01 LAB — BASIC METABOLIC PANEL
BUN/Creatinine Ratio: 17 (ref 10–24)
BUN: 20 mg/dL (ref 8–27)
CO2: 25 mmol/L (ref 20–29)
Calcium: 9.7 mg/dL (ref 8.6–10.2)
Chloride: 102 mmol/L (ref 96–106)
Creatinine, Ser: 1.2 mg/dL (ref 0.76–1.27)
Glucose: 100 mg/dL — ABNORMAL HIGH (ref 70–99)
Potassium: 4.7 mmol/L (ref 3.5–5.2)
Sodium: 141 mmol/L (ref 134–144)
eGFR: 64 mL/min/{1.73_m2} (ref >59)

## 2021-11-01 LAB — LIPID PANEL
Chol/HDL Ratio: 3.8 ratio (ref 0.0–5.0)
Cholesterol, Total: 193 mg/dL (ref 100–199)
HDL: 51 mg/dL (ref >39)
LDL Chol Calc (NIH): 96 mg/dL (ref 0–99)
Triglycerides: 274 mg/dL — ABNORMAL HIGH (ref 0–149)
VLDL Cholesterol Cal: 46 mg/dL — ABNORMAL HIGH (ref 5–40)

## 2021-11-01 LAB — ALT: ALT: 27 IU/L (ref 0–44)

## 2021-11-01 MED ORDER — CARVEDILOL 6.25 MG PO TABS: 6.2500 mg | ORAL_TABLET | Freq: Two times a day (BID) | ORAL | 3 refills | Status: DC

## 2021-11-01 NOTE — Progress Notes (Signed)
Cardiology Office Note   Date:  11/01/2021   ID:  Joseph Foster, DOB 1945/11/29, MRN [id number]  PCP:  Hulan Fess, MD  Cardiologist:   Mertie Moores, MD   Chief Complaint  Patient presents with   Hypertension        Congestive Heart Failure   Problem list 1. Essential hypertension 2. Combined systolic and diastolic congestive heart failure-  3. COPD 4. Anxiety/depression 5. Hyperlipidemia  6.  TIA - on ASA 325 for his TIA     Joseph Foster is a 76 y.o. male who presents for evaluation of CHF Has not had and dyspnea.   Walks 3-5 a day , Monday - Friday . No PND or orthopnea. No CP , no syncope.   Still eats some salt  - eats soup once a week, hot dogs once a month. Eats deli meats that have some salt   Non-smoker Drinks lots of beer -  4-5 a day.  More on the weekend.   April 17, 2016:;     Doing well. Walks every day - several miles No CP or dyspnea.  Some fatigue climbing a hill  Nov. 15, 2017:  Joseph Foster is seen Today for his combined systolic and diastolic congestive heart failure. Heart catheterization on 04/29/2016 reveals mild LAD stenosis. His LV systolic function was normal. He has grade 1 diastolic dysfunction by echo   Aug. 20, 2018:  Doing well.    Walks 5 days a week.  Rides his motorcycle on the weekends. EF is 45-50% Avoids salt.   Feb. 4, 2019:  Doing well.  Tolerating the Entresto BP is low.     Gets dizzy after drinking several beers.    Sept. 24, 2019: Joseph Foster is seen back today for follow-up of his chronic combined systolic and diastolic congestive heart failure: Echocardiogram in 2015 revealed an ejection fraction of 45%.  Most recent echocardiogram in August, 2019 reveals normal left ventricular systolic function with an EF of 55%.  He has grade 1 diastolic dysfunction.  Walks daily ,  No CP or dyspnea.   Jan. 27, 2021;  Joseph Foster is seen back today for follow-up of his chronic combined systolic and diastolic congestive heart  failure. He was last seen 2 years ago. breathinig is ok Has some orthostatic hypotension .   Seems to have improved.  Nov. 18, 2021: Joseph Foster is seen back for follow up of his chronic combined CHF. Is having some orthostatic hypotension orthostasis resolved when he switched his tamulosin from daytime to night .  Breathing is good Work out in gym 3 times a week.  injurred his right foot  Labs from oct. Were reviewed.  Trigs are 225.  We discussed limiting his carbs   Feb. 2, 2023 Joseph Foster is seen for follow up of his CHF, is on Entreso  Is gaining some weight  Wt is 271 lbs  today  Works out regularly   Past Medical History:  Diagnosis Date   Acute medial meniscus tear of left knee    Anxiety    Aortic dilatation ([place]) 05/12/2018   Mild, noted on ECHO   Arthritis KNEES   Chronic back pain    Chronic combined systolic and diastolic heart failure (HCC)    Closed left ankle fracture    COPD (chronic obstructive pulmonary disease) ([place])    asymptomatic, denies shortness of breath   DDD (degenerative disc disease), lumbar L4   Depression    GERD (gastroesophageal reflux disease)  Grade I diastolic dysfunction 35/45/6256   Noted on ECHO   Hyperlipidemia    Hypertension    Insomnia    LAD stenosis 03/2016   Mild, noted on heart catheterization   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   Noted on EKG   Lung nodule 2010   Left lung base nodule   Memory loss    Numbness and tingling in both hands    while riding motorcycle   Numbness and tingling of both feet    pinched nerve   Pneumonia    age 16's   Prostate cancer ([place])    Ringing in ears    Resolved   TIA (transient ischemic attack) 2015    Past Surgical History:  Procedure Laterality Date   CARDIAC CATHETERIZATION N/A 04/29/2016   Procedure: Left Heart Cath and Coronary Angiography;  Surgeon: Leonie Man, MD;  Location: [facility];  Service: Cardiovascular;  Laterality: N/A;   COLONOSCOPY      CYSTOSCOPY N/A 03/15/2019   Procedure: CYSTOSCOPY;  Surgeon: Kathie Rhodes, MD;  Location: [facility];  Service: Urology;  Laterality: N/A;  2 seeds detected and removed by Dr. Karsten Ro   [facility] EEG 41-60MINS  08/23/2014       KNEE ARTHROSCOPY  01/15/2012   Procedure: ARTHROSCOPY KNEE;  Surgeon: Gearlean Alf, MD;  Location: [facility];  Service: Orthopedics;  Laterality: Left;  meniscal debridement   ORIF ANKLE FRACTURE Left 12/07/2020   Procedure: OPEN REDUCTION INTERNAL FIXATION (ORIF) left lateral malleolus fracture; stress exam left foot;  Surgeon: Wylene Simmer, MD;  Location: [place];  Service: Orthopedics;  Laterality: Left;  78min   RADIOACTIVE SEED IMPLANT N/A 03/15/2019   Procedure: RADIOACTIVE SEED IMPLANT/BRACHYTHERAPY IMPLANT;  Surgeon: Kathie Rhodes, MD;  Location: [place] [facility];  Service: Urology;  Laterality: N/A;  81 seeds   RIGHT KNEE SURG.  2005   SPINAL CORD STIMULATOR IMPLANT  2009   TONSILECTOMY/ADENOIDECTOMY WITH MYRINGOTOMY     TOTAL KNEE ARTHROPLASTY Right 12/26/2014   Procedure: TOTAL RIGHT  KNEE ARTHROPLASTY;  Surgeon: Gaynelle Arabian, MD;  Location: [facility];  Service: Orthopedics;  Laterality: Right;   UPPER GI ENDOSCOPY     VASECTOMY       Current Outpatient Medications  Medication Sig Dispense Refill   ALPRAZolam (XANAX) 0.5 MG tablet Take 0.5 mg by mouth 2 (two) times daily as needed for anxiety.      carvedilol (COREG) 3.125 MG tablet TAKE 1 TABLET BY MOUTH TWICE A DAY 60 tablet 0   ENTRESTO 24-26 MG TAKE 1 TABLET BY MOUTH TWICE A DAY 60 tablet 11   esomeprazole (NEXIUM) 20 MG capsule Take 20 mg by mouth as needed.     Multiple Vitamin (MULTIVITAMIN) tablet Take 1 tablet by mouth daily.     PARoxetine (PAXIL) 40 MG tablet Take 40 mg by mouth every morning.     pravastatin (PRAVACHOL) 20 MG tablet Take 40 mg by mouth daily.     tamsulosin (FLOMAX) 0.4 MG CAPS capsule Take 0.4 mg by mouth daily.     No  current facility-administered medications for this visit.    Allergies:   Codeine, Dilaudid [hydromorphone hcl], Penicillins, Simvastatin, Ceclor [cefaclor], Oxycontin [oxycodone hcl], and Sulfa antibiotics    Social History:  The patient  reports that he quit smoking about 20 years ago. His smoking use included cigars and cigarettes. He has a 5.00 pack-year smoking history. He has quit using smokeless tobacco.  His smokeless tobacco use included snuff. He reports current alcohol use of about 21.0 standard drinks per week. He reports current drug use. Drug: Marijuana.   Family History:  The patient's family history includes Arthritis in his mother; Heart disease in his father; Hyperlipidemia in his brother and mother; Leukemia in his mother; Lung cancer in his father.    ROS:   See current history.  All other systems are negative  Physical Exam: Blood pressure 132/72, pulse 89, height 6\' 2"  (1.88 m), weight 271 lb (122.9 kg), SpO2 97 %.  GEN:  Well nourished, well developed in no acute distress,  moderately obese  HEENT: Normal NECK: No JVD; No carotid bruits LYMPHATICS: No lymphadenopathy CARDIAC: RRR , no murmurs, rubs, gallops RESPIRATORY:  Clear to auscultation without rales, wheezing or rhonchi  ABDOMEN: Soft, non-tender, non-distended MUSCULOSKELETAL:  No edema; No deformity  SKIN: Warm and dry NEUROLOGIC:  Alert and oriented x 3    EKG: November 01, 2021: Normal sinus rhythm at 89.  No ST or T wave changes.   Recent Labs: No results found for requested labs within last 8760 hours.    Lipid Panel    Component Value Date/Time   CHOL 165 08/23/2014 0618   TRIG 131 08/23/2014 0618   HDL 51 08/23/2014 0618   CHOLHDL 3.2 08/23/2014 0618   VLDL 26 08/23/2014 0618   LDLCALC 88 08/23/2014 0618      Wt Readings from Last 3 Encounters:  11/01/21 271 lb (122.9 kg)  12/07/20 264 lb 12.4 oz (120.1 kg)  08/18/20 258 lb 9.6 oz (117.3 kg)      Other studies  Reviewed: Additional studies/ records that were reviewed today include: . Review of the above records demonstrates:    ASSESSMENT AND PLAN:  1. Essential hypertension -  BP is well controlled.    Will increase coreg to 6.25 BID to reduce HR    2. Chronic  Combined systolic and diastolic congestive heart failure-   On entresto 24-26 BID,  increase coreg to 6.25 BID     3. COPD  4. Anxiety/depression:  seems to be well controlled.   5. Hyperlipidemia - still eating too many carbs.  Gaining weight. Check labs today  Refer to PREP program .     6.  TIA -   Follow up with APP or me I 1 year    Mertie Moores, MD  11/01/2021 9:39 AM    [facility] [place], [place], [place]  [address] Phone: [phone]; Fax: [phone]

## 2021-11-01 NOTE — Patient Instructions (Addendum)
Medication Instructions:  Your physician has recommended you make the following change in your medication:   INCREASE the Carvedilol to 6.125 taking 1 twice a day   *If you need a refill on your cardiac medications before your next appointment, please call your pharmacy*   Lab Work: TODAY:  LIPID, BMP, & ALT  If you have labs (blood work) drawn today and your tests are completely normal, you will receive your results only by: [place] (if you have MyChart) OR A paper copy in the mail If you have any lab test that is abnormal or we need to change your treatment, we will call you to review the results.   Testing/Procedures: None ordered   You have been referred to [facility] [place] Agency: At [facility], you and your health needs are our priority.  As part of our continuing mission to provide you with exceptional heart care, we have created designated Provider Care Teams.  These Care Teams include your primary Cardiologist (physician) and Advanced Practice Providers (APPs -  Physician Assistants and Nurse Practitioners) who all work together to provide you with the care you need, when you need it.  We recommend signing up for the patient portal called "MyChart".  Sign up information is provided on this After Visit Summary.  MyChart is used to connect with patients for Virtual Visits (Telemedicine).  Patients are able to view lab/test results, encounter notes, upcoming appointments, etc.  Non-urgent messages can be sent to your provider as well.   To learn more about what you can do with MyChart, go to [email or web address].    Your next appointment:   12 month(s)  The format for your next appointment:   In Person  Provider:   Mertie Moores, MD  or Robbie Lis, PA-C, Christen Bame, NP, or Richardson Dopp, PA-C         Other Instructions

## 2021-11-02 ENCOUNTER — Telehealth: Payer: Self-pay

## 2021-11-02 NOTE — Telephone Encounter (Signed)
Call from pt. Explained PREP to pt, interested in participating Can do next Joseph Foster class 11/19/21 at 1030am Intake will be 2/15 at 915am at [facility]

## 2021-11-02 NOTE — Telephone Encounter (Signed)
VMT to ref PREP referral. Request call back to discuss

## 2021-11-04 ENCOUNTER — Other Ambulatory Visit: Payer: Self-pay | Admitting: Cardiovascular Disease

## 2021-11-08 ENCOUNTER — Telehealth: Payer: Self-pay | Admitting: Cardiovascular Disease

## 2021-11-08 NOTE — Telephone Encounter (Signed)
° °  Pt is returning call to get blood results

## 2021-11-08 NOTE — Telephone Encounter (Signed)
-----   Message from Thayer Headings, MD sent at 11/02/2021  6:24 PM EST ----- Joseph Foster are very elevated. Have him reduce his intake of carbs and increase his exercise .

## 2021-11-08 NOTE — Telephone Encounter (Signed)
The patient has been notified of the result and verbalized understanding.  All questions (if any) were answered. Nuala Alpha, LPN 02/05/9233 14:43 PM

## 2021-11-16 ENCOUNTER — Telehealth: Payer: Self-pay

## 2021-11-16 NOTE — Telephone Encounter (Signed)
Call to pt to verify he still wanted to participate in PREP starting on Monday 11/19/21 He is still interested.  He will come in at 10am to complete intake prior to class

## 2021-11-29 NOTE — Progress Notes (Signed)
[employer] PREP Weekly Session  Patient Details  Name: Joseph Foster MRN: [id number] Date of Birth: 06/10/1946 Age: 76 y.o. PCP: Hulan Fess, MD  Vitals:   11/26/21 1030  Weight: 271 lb 3.2 oz (123 kg)     [employer] Weekly seesion - 11/29/21 1700       [employer] "PREP" Location   [facility] "PREP" Location [facility]      Weekly Session   Topic Discussed Importance of resistance training;Other ways to be active    Minutes exercised this week 45 minutes    Classes attended to date [place] 11/29/2021, 5:25 PM

## 2021-12-06 ENCOUNTER — Other Ambulatory Visit: Payer: Self-pay | Admitting: Cardiovascular Disease

## 2021-12-17 NOTE — Progress Notes (Signed)
[employer] PREP Weekly Session ? ?Patient Details  ?Name: Joseph Foster ?MRN: [id number] ?Date of Birth: 08/14/1946 ?Age: 76 y.o. ?PCP: Hulan Fess, MD ? ?Vitals:  ? 12/17/21 1459  ?Weight: 272 lb (123.4 kg)  ? ? ? [employer] Weekly seesion - 12/17/21 1400   ? ?  ? [employer] "PREP" Location  ? [facility] "PREP" Location [place]   ?  ? Weekly Session  ? Topic Discussed Health habits   ? Minutes exercised this week 210 minutes   ? Classes attended to date 6   ? ?  ?  ? ?  ? ? ?[name] ?12/17/2021, 3:00 PM ? ? ?

## 2021-12-24 NOTE — Progress Notes (Signed)
[employer] PREP Weekly Session ? ?Patient Details  ?Name: Joseph Foster ?MRN: [id number] ?Date of Birth: 16-Dec-1945 ?Age: 76 y.o. ?PCP: Hulan Fess, MD ? ?Vitals:  ? 12/24/21 1219  ?Weight: 274 lb (124.3 kg)  ? ? ? [employer] Weekly seesion - 12/24/21 1200   ? ?  ? [facility] "PREP" Location  ? [facility] "PREP[facility] Location [place]   ?  ? Weekly Session  ? Topic Discussed Restaurant Eating   Salt and sugar demo  ? Minutes exercised this week 125 minutes   ? Classes attended to date 67   ? ?  ?  ? ?  ? ? ?[name] ?12/24/2021, 12:21 PM ? ? ?

## 2022-01-01 NOTE — Progress Notes (Signed)
[facility] PREP Weekly Session ? ?Patient Details  ?Name: Joseph Foster ?MRN: [id number] ?Date of Birth: May 20, 1946 ?Age: 76 y.o. ?PCP: Hulan Fess, MD ? ?Vitals:  ? 12/31/21 1030  ?Weight: 270 lb 6.4 oz (122.7 kg)  ? ? ? [facility] Weekly seesion - 01/01/22 1700   ? ?  ? [facility] Location  ? [facility]PREP[facility] Location [place]   ?  ? Weekly Session  ? Topic Discussed Stress management and problem solving   meditation, breathwork  ? Minutes exercised this week 640 minutes   ? Classes attended to date 49   ? ?  ?  ? ?  ? ? ?[name] ?01/01/2022, 5:04 PM ? ? ?

## 2022-01-07 NOTE — Progress Notes (Signed)
[facility] PREP Weekly Session ? ?Patient Details  ?Name: Joseph Foster ?MRN: [id number] ?Date of Birth: [birth year] ?Age: 76 y.o. ?PCP: Hulan Fess, MD ? ?Vitals:  ? 01/07/22 1250  ?Weight: 271 lb (122.9 kg)  ? ? ? [facility] Weekly seesion - 01/07/22 1200   ? ?  ? [facility]  ? [facility]PREP[facility] [place]   ?  ? Weekly Session  ? Topic Discussed Expectations and non-scale victories   ? Minutes exercised this week 115 minutes   ? Classes attended to date 13   ? ?  ?  ? ?  ? ? ?[name] ?01/07/2022, 12:51 PM ? ? ?

## 2022-01-14 NOTE — Progress Notes (Signed)
[facility] PREP Weekly Session ? ?Patient Details  ?Name: Joseph Foster ?MRN: [id number] ?Date of Birth: August 11, 1946 ?Age: 76 y.o. ?PCP: Hulan Fess, MD ? ?Vitals:  ? 01/14/22 1354  ?Weight: 273 lb (123.8 kg)  ? ? ? [facility] Weekly seesion - 01/14/22 1300   ? ?  ? [facility]  ? [facility]PREP[facility] [place]   ?  ? Weekly Session  ? Topic Discussed Other   portion control, label reading  ? Minutes exercised this week 210 minutes   ? Classes attended to date 37   ? ?  ?  ? ?  ? ? ?[name] ?01/14/2022, 1:55 PM ? ? ?

## 2022-01-24 NOTE — Progress Notes (Signed)
[facility] PREP Weekly Session ? ?Patient Details  ?Name: ZAIR BORAWSKI ?MRN: [id number] ?Date of Birth: 08-02-1946 ?Age: 76 y.o. ?PCP: Hulan Fess, MD ? ?Vitals:  ? 01/21/22 1030  ?Weight: 272 lb (123.4 kg)  ? ? ? [facility] Weekly seesion - 01/24/22 1700   ? ?  ? [facility]  ? [facility]PREP[facility] Location [place]   ?  ? Weekly Session  ? Topic Discussed Finding support   ? Minutes exercised this week 150 minutes   ? Classes attended to date 46   ? ?  ?  ? ?  ? ? ?[name] ?01/24/2022, 5:10 PM ? ? ?

## 2022-02-07 NOTE — Progress Notes (Signed)
[facility] PREP Weekly Session ? ?Patient Details  ?Name: Joseph Foster ?MRN: [id number] ?Date of Birth: [birth year] ?Age: 76 y.o. ?PCP: Hulan Fess, MD ? ?Vitals:  ? 02/04/22 1030  ?Weight: 269 lb (122 kg)  ? ? ? [facility] Weekly seesion - 02/07/22 1000   ? ?  ? [facility]  ? [facility] "PREP[facility] Location [place]   ?  ? Weekly Session  ? Topic Discussed Hitting roadblocks   ? Minutes exercised this week 135 minutes   ? Classes attended to date 13   ? ?  ?  ? ?  ? ? ?[name] ?02/07/2022, 10:20 AM ? ? ?

## 2022-02-18 NOTE — Progress Notes (Signed)
[facility] PREP Evaluation  Patient Details  Name: Joseph Foster MRN: [id number] Date of Birth: [birth year] Age: 76 y.o. PCP: Hulan Fess, MD  Vitals:   02/13/22 1030  BP: 110/62  Pulse: 78  SpO2: 97%  Weight: 271 lb 12.8 oz (123.3 kg)     [facility] Eval - 02/18/22 1000       [facility] "PREP" Location   [facility] Location [facility]      Referral    Referring Provider Nahser    Program Start Date --   final date of PREP 02/11/22     Measurement   Waist Circumference 50.5 inches    Hip Circumference 48 inches    Body fat 35.9 percent      Information for Trainer   Goals Reviewed and plans to cont exercising      Mobility and Daily Activities   I find it easy to walk up or down two or more flights of stairs. 3    I have no trouble taking out the trash. 4    I do housework such as vacuuming and dusting on my own without difficulty. 4    I can easily lift a gallon of milk (8lbs). 4    I can easily walk a mile. 3    I have no trouble reaching into high cupboards or reaching down to pick up something from the floor. 2    I do not have trouble doing out-door work such as Armed forces logistics/support/administrative officer, raking leaves, or gardening. 1      Mobility and Daily Activities   I feel younger than my age. 3    I feel independent. 4    I feel energetic. 2    I live an active life.  2    I feel strong. 3    I feel healthy. 3    I feel active as other people my age. 1      How fit and strong are you.   Fit and Strong Total Score 39            Past Medical History:  Diagnosis Date   Acute medial meniscus tear of left knee    Anxiety    Aortic dilatation (HCC) 05/12/2018   Mild, noted on ECHO   Arthritis KNEES   Chronic back pain    Chronic combined systolic and diastolic heart failure (HCC)    Closed left ankle fracture    COPD (chronic obstructive pulmonary disease) ([place])    asymptomatic, denies shortness of breath   DDD (degenerative disc disease), lumbar L4   Depression    GERD  (gastroesophageal reflux disease)    Grade I diastolic dysfunction 46/65/9935   Noted on ECHO   Hyperlipidemia    Hypertension    Insomnia    LAD stenosis 03/2016   Mild, noted on heart catheterization   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   Noted on EKG   Lung nodule 2010   Left lung base nodule   Memory loss    Numbness and tingling in both hands    while riding motorcycle   Numbness and tingling of both feet    pinched nerve   Pneumonia    age 62's   Prostate cancer ([employer])    Ringing in ears    Resolved   TIA (transient ischemic attack) 2015   Past Surgical History:  Procedure Laterality Date   CARDIAC CATHETERIZATION N/A 04/29/2016   Procedure: Left Heart Cath and  Coronary Angiography;  Surgeon: Leonie Man, MD;  Location: [place] [facility];  Service: Cardiovascular;  Laterality: N/A;   COLONOSCOPY     CYSTOSCOPY N/A 03/15/2019   Procedure: CYSTOSCOPY;  Surgeon: Kathie [place], MD;  Location: [facility];  Service: Urology;  Laterality: N/A;  2 seeds detected and removed by Dr. Karsten Ro   [facility] EEG 41-60MINS  08/23/2014       KNEE ARTHROSCOPY  01/15/2012   Procedure: ARTHROSCOPY KNEE;  Surgeon: Gearlean Alf, MD;  Location: [facility];  Service: Orthopedics;  Laterality: Left;  meniscal debridement   ORIF ANKLE FRACTURE Left 12/07/2020   Procedure: OPEN REDUCTION INTERNAL FIXATION (ORIF) left lateral malleolus fracture; stress exam left foot;  Surgeon: Wylene Simmer, MD;  Location: [place];  Service: Orthopedics;  Laterality: Left;  43mn   RADIOACTIVE SEED IMPLANT N/A 03/15/2019   Procedure: RADIOACTIVE SEED IMPLANT/BRACHYTHERAPY IMPLANT;  Surgeon: OKathie [place] MD;  Location[place]  Service: Urology;  Laterality: N/A;  81 seeds   RIGHT KNEE SURG.  2005   SPINAL CORD STIMULATOR IMPLANT  2009   TONSILECTOMY/ADENOIDECTOMY WITH MYRINGOTOMY     TOTAL KNEE ARTHROPLASTY Right 12/26/2014   Procedure:  TOTAL RIGHT  KNEE ARTHROPLASTY;  Surgeon: FGaynelle Arabian MD;  [facility];  Service: Orthopedics;  Laterality: Right;   UPPER GI ENDOSCOPY     VASECTOMY     Social History   Tobacco Use  Smoking Status Former   Packs/day: 0.25   Years: 20.00   Pack years: 5.00   Types: Cigars, Cigarettes   Quit date: 01/09/2001   Years since quitting: 21.1  Smokeless Tobacco Former   Types: Snuff  Tobacco Comments   one cigar daily  Cardio march: 193 to 202 Sit to stand: 9 to 12 Bicep curl: 20-25 Still needs work to improve single leg balance  Attended >21 workouts, 11 of 12 educational sessions   PBarnett Hatter5/22/2023, 10:44 AM

## 2022-04-01 ENCOUNTER — Other Ambulatory Visit: Payer: Self-pay | Admitting: Cardiovascular Disease

## 2022-11-10 ENCOUNTER — Encounter: Payer: Self-pay | Admitting: Cardiovascular Disease

## 2022-11-10 NOTE — Progress Notes (Unsigned)
Cardiology Office Note   Date:  11/13/2022   ID:  EMRIC NICOLO, DOB [birth year], MRN [id number]  PCP:  Hulan Fess, MD  Cardiologist:   Mertie Moores, MD   Chief Complaint  Patient presents with   Congestive Heart Failure        Hypertension        Problem list 1. Essential hypertension 2. Combined systolic and diastolic congestive heart failure-  3. COPD 4. Anxiety/depression 5. Hyperlipidemia  6.  TIA - on ASA 325 for his TIA     Joseph Foster is a 77 y.o. male who presents for evaluation of CHF Has not had and dyspnea.   Walks 3-5 a day , Monday - Friday . No PND or orthopnea. No CP , no syncope.   Still eats some salt  - eats soup once a week, hot dogs once a month. Eats deli meats that have some salt   Non-smoker Drinks lots of beer -  4-5 a day.  More on the weekend.   April 17, 2016:;     Doing well. Walks every day - several miles No CP or dyspnea.  Some fatigue climbing a hill  Nov. 15, 2017:  Joseph Foster is seen Today for his combined systolic and diastolic congestive heart failure. Heart catheterization on 04/29/2016 reveals mild LAD stenosis. His LV systolic function was normal. He has grade 1 diastolic dysfunction by echo   Aug. 20, 2018:  Doing well.    Walks 5 days a week.  Rides his motorcycle on the weekends. EF is 45-50% Avoids salt.   Feb. 4, 2019:  Doing well.  Tolerating the Entresto BP is low.     Gets dizzy after drinking several beers.    Sept. 24, 2019: Joseph Foster is seen back today for follow-up of his chronic combined systolic and diastolic congestive heart failure: Echocardiogram in 2015 revealed an ejection fraction of 45%.  Most recent echocardiogram in August, 2019 reveals normal left ventricular systolic function with an EF of 55%.  He has grade 1 diastolic dysfunction.  Walks daily ,  No CP or dyspnea.   Jan. 27, 2021;  Joseph Foster is seen back today for follow-up of his chronic combined systolic and diastolic congestive  heart failure. He was last seen 2 years ago. breathinig is ok Has some orthostatic hypotension .   Seems to have improved.  Nov. 18, 2021: Joseph Foster is seen back for follow up of his chronic combined CHF. Is having some orthostatic hypotension orthostasis resolved when he switched his tamulosin from daytime to night .  Breathing is good Work out in gym 3 times a week.  injurred his right foot  Labs from oct. Were reviewed.  Trigs are 225.  We discussed limiting his carbs   Feb. 2, 2023 Joseph Foster is seen for follow up of his CHF, is on Entreso  Is gaining some weight  Wt is 271 lbs  today  Works out regularly    Feb. 14, 2024 Joseph Foster is seen for follow up of his CHF , Wt is  269 lbs  No CP , no dyspnea  Goes to the gym 3 days a week   Advised calorie reduction, weight loss  Lipids were elevated this past year Will recheck today    Past Medical History:  Diagnosis Date   Acute medial meniscus tear of left knee    Anxiety    Aortic dilatation ([place]) 05/12/2018   Mild, noted on ECHO   Arthritis KNEES  Chronic back pain    Chronic combined systolic and diastolic heart failure (HCC)    Closed left ankle fracture    COPD (chronic obstructive pulmonary disease) (HCC)    asymptomatic, denies shortness of breath   DDD (degenerative disc disease), lumbar L4   Depression    GERD (gastroesophageal reflux disease)    Grade I diastolic dysfunction 123456   Noted on ECHO   Hyperlipidemia    Hypertension    Insomnia    LAD stenosis 03/2016   Mild, noted on heart catheterization   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   Noted on EKG   Lung nodule 2010   Left lung base nodule   Memory loss    Numbness and tingling in both hands    while riding motorcycle   Numbness and tingling of both feet    pinched nerve   Pneumonia    age 86's   Prostate cancer ([place])    Ringing in ears    Resolved   TIA (transient ischemic attack) 2015    Past Surgical History:  Procedure  Laterality Date   CARDIAC CATHETERIZATION N/A 04/29/2016   Procedure: Left Heart Cath and Coronary Angiography;  Surgeon: Leonie Man, MD;  Location: [place] [facility];  Service: Cardiovascular;  Laterality: N/A;   COLONOSCOPY     CYSTOSCOPY N/A 03/15/2019   Procedure: CYSTOSCOPY;  Surgeon: Kathie [place], MD;  Location: [facility];  Service: Urology;  Laterality: N/A;  2 seeds detected and removed by Dr. Karsten Ro   [facility] EEG 41-60MINS  08/23/2014       KNEE ARTHROSCOPY  01/15/2012   Procedure: ARTHROSCOPY KNEE;  Surgeon: Gearlean Alf, MD;  [facility]: [facility];  Service: Orthopedics;  Laterality: Left;  meniscal debridement   ORIF ANKLE FRACTURE Left 12/07/2020   Procedure: OPEN REDUCTION INTERNAL FIXATION (ORIF) left lateral malleolus fracture; stress exam left foot;  Surgeon: Wylene Simmer, MD;  Location: [place];  Service: Orthopedics;  Laterality: Left;  78mn   RADIOACTIVE SEED IMPLANT N/A 03/15/2019   Procedure: RADIOACTIVE SEED IMPLANT/BRACHYTHERAPY IMPLANT;  Surgeon: OKathie [place] MD;  [place]  Service: Urology;  Laterality: N/A;  81 seeds   RIGHT KNEE SURG.  2005   SPINAL CORD STIMULATOR IMPLANT  2009   TONSILECTOMY/ADENOIDECTOMY WITH MYRINGOTOMY     TOTAL KNEE ARTHROPLASTY Right 12/26/2014   Procedure: TOTAL RIGHT  KNEE ARTHROPLASTY;  Surgeon: FGaynelle Arabian MD;  [facility];  Service: Orthopedics;  Laterality: Right;   UPPER GI ENDOSCOPY     VASECTOMY       Current Outpatient Medications  Medication Sig Dispense Refill   ALPRAZolam (XANAX) 0.5 MG tablet Take 0.5 mg by mouth 2 (two) times daily as needed for anxiety.      carvedilol (COREG) 6.25 MG tablet Take 1 tablet (6.25 mg total) by mouth 2 (two) times daily. 180 tablet 3   carvedilol (COREG) 6.25 MG tablet Take 0.5 tablets (3.125 mg total) by mouth 2 (two) times daily. 180 tablet 1   co-enzyme Q-10 30 MG capsule 1 capsule Orally daily      ENTRESTO 24-26 MG TAKE 1 TABLET BY MOUTH TWICE A DAY 180 tablet 3   esomeprazole (NEXIUM) 20 MG capsule Take 20 mg by mouth as needed.     Multiple Vitamin (MULTIVITAMIN) tablet Take 1 tablet by mouth daily.     PARoxetine (PAXIL) 40 MG tablet Take 40 mg by mouth every morning.  tamsulosin (FLOMAX) 0.4 MG CAPS capsule Take 0.4 mg by mouth daily.     pravastatin (PRAVACHOL) 20 MG tablet Take 40 mg by mouth daily. (Patient not taking: Reported on 11/13/2022)     No current facility-administered medications for this visit.    Allergies:   Codeine, Dilaudid [hydromorphone hcl], Penicillins, Simvastatin, Ceclor [cefaclor], Oxycontin [oxycodone hcl], and Sulfa antibiotics    Social History:  The patient  reports that he quit smoking about 21 years ago. His smoking use included cigars and cigarettes. He has a 5.00 pack-year smoking history. He has quit using smokeless tobacco.  His smokeless tobacco use included snuff. He reports current alcohol use of about 21.0 standard drinks of alcohol per week. He reports current drug use. Drug: Marijuana.   Family History:  The patient's family history includes Arthritis in his mother; Heart disease in his father; Hyperlipidemia in his brother and mother; Leukemia in his mother; Lung cancer in his father.    ROS:   See current history.  All other systems are negative  Physical Exam: Blood pressure 132/72, pulse 86, height 6' 2"$  (1.88 m), weight 269 lb 12.8 oz (122.4 kg), SpO2 98 %.      GEN:  moderately obese male,  NAD  HEENT: Normal NECK: No JVD; No carotid bruits LYMPHATICS: No lymphadenopathy CARDIAC: RRR , no murmurs, rubs, gallops RESPIRATORY:  Clear to auscultation without rales, wheezing or rhonchi  ABDOMEN: Soft, non-tender, non-distended MUSCULOSKELETAL:  No edema; No deformity  SKIN: Warm and dry NEUROLOGIC:  Alert and oriented x 3     EKG: November 13, 2022: Normal sinus rhythm at 86.  Significant artifact.  Pulmonary disease  pattern.   Recent Labs: No results found for requested labs within last 365 days.    Lipid Panel    Component Value Date/Time   CHOL 193 11/01/2021 1003   TRIG 274 (H) 11/01/2021 1003   HDL 51 11/01/2021 1003   CHOLHDL 3.8 11/01/2021 1003   CHOLHDL 3.2 08/23/2014 0618   VLDL 26 08/23/2014 0618   LDLCALC 96 11/01/2021 1003      Wt Readings from Last 3 Encounters:  11/13/22 269 lb 12.8 oz (122.4 kg)  02/13/22 271 lb 12.8 oz (123.3 kg)  02/04/22 269 lb (122 kg)      Other studies Reviewed: Additional studies/ records that were reviewed today include: . Review of the above records demonstrates:    ASSESSMENT AND PLAN:  1. Essential hypertension -   blood pressure is well-controlled.  Continue current medications.  I advised him to work on weight loss.   2. Chronic  Combined systolic and diastolic congestive heart failure-continue carvedilol, Entresto.  I advised him to work on weight loss.      3. COPD  4. Anxiety/depression: Plans per his primary medical doctor.  5. Hyperlipidemia -  .  He is on Pravachol.  His lipids were elevated at his last check.  Will recheck labs today.    Mertie Moores, MD  11/13/2022 9:33 AM    [address], [place], [place]  [address] Phone: [phone]; Fax: [phone]

## 2022-11-13 ENCOUNTER — Ambulatory Visit: Payer: Medicare Other | Attending: Cardiovascular Disease | Admitting: Cardiovascular Disease

## 2022-11-13 ENCOUNTER — Encounter: Payer: Self-pay | Admitting: Cardiovascular Disease

## 2022-11-13 VITALS — BP 132/72 | HR 86 | Ht 74.0 in | Wt 269.8 lb

## 2022-11-13 DIAGNOSIS — Z6834 Body mass index (BMI) 34.0-34.9, adult: Secondary | ICD-10-CM | POA: Diagnosis present

## 2022-11-13 DIAGNOSIS — E782 Mixed hyperlipidemia: Secondary | ICD-10-CM | POA: Diagnosis present

## 2022-11-13 DIAGNOSIS — I5042 Chronic combined systolic (congestive) and diastolic (congestive) heart failure: Secondary | ICD-10-CM | POA: Diagnosis present

## 2022-11-13 DIAGNOSIS — E6609 Other obesity due to excess calories: Secondary | ICD-10-CM

## 2022-11-13 DIAGNOSIS — E66811 Obesity, class 1: Secondary | ICD-10-CM

## 2022-11-13 LAB — BASIC METABOLIC PANEL
BUN/Creatinine Ratio: 17 (ref 10–24)
BUN: 19 mg/dL (ref 8–27)
CO2: 25 mmol/L (ref 20–29)
Calcium: 9.7 mg/dL (ref 8.6–10.2)
Chloride: 97 mmol/L (ref 96–106)
Creatinine, Ser: 1.12 mg/dL (ref 0.76–1.27)
Glucose: 97 mg/dL (ref 70–99)
Potassium: 4.7 mmol/L (ref 3.5–5.2)
Sodium: 136 mmol/L (ref 134–144)
eGFR: 69 mL/min/{1.73_m2} (ref >59)

## 2022-11-13 LAB — LIPID PANEL
Chol/HDL Ratio: 4.1 ratio (ref 0.0–5.0)
Cholesterol, Total: 225 mg/dL — ABNORMAL HIGH (ref 100–199)
HDL: 55 mg/dL (ref >39)
LDL Chol Calc (NIH): 138 mg/dL — ABNORMAL HIGH (ref 0–99)
Triglycerides: 178 mg/dL — ABNORMAL HIGH (ref 0–149)
VLDL Cholesterol Cal: 32 mg/dL (ref 5–40)

## 2022-11-13 LAB — ALT: ALT: 32 IU/L (ref 0–44)

## 2022-11-13 NOTE — Patient Instructions (Signed)
Medication Instructions:  Your physician recommends that you continue on your current medications as directed. Please refer to the Current Medication list given to you today.  *If you need a refill on your cardiac medications before your next appointment, please call your pharmacy*   Lab Work: Lipids, ALT, BMET today If you have labs (blood work) drawn today and your tests are completely normal, you will receive your results only by: [place] (if you have MyChart) OR A paper copy in the mail If you have any lab test that is abnormal or we need to change your treatment, we will call you to review the results.   Testing/Procedures: NONE   Follow-Up: At [facility], you and your health needs are our priority.  As part of our continuing mission to provide you with exceptional heart care, we have created designated Provider Care Teams.  These Care Teams include your primary Cardiologist (physician) and Advanced Practice Providers (APPs -  Physician Assistants and Nurse Practitioners) who all work together to provide you with the care you need, when you need it.  We recommend signing up for the patient portal called "MyChart".  Sign up information is provided on this After Visit Summary.  MyChart is used to connect with patients for Virtual Visits (Telemedicine).  Patients are able to view lab/test results, encounter notes, upcoming appointments, etc.  Non-urgent messages can be sent to your provider as well.   To learn more about what you can do with MyChart, go to [email or web address].    Your next appointment:   6 month(s)  Provider:   Christen Bame, NP

## 2022-11-15 ENCOUNTER — Telehealth: Payer: Self-pay | Admitting: Cardiovascular Disease

## 2022-11-15 DIAGNOSIS — E782 Mixed hyperlipidemia: Secondary | ICD-10-CM

## 2022-11-15 DIAGNOSIS — Z789 Other specified health status: Secondary | ICD-10-CM

## 2022-11-15 NOTE — Telephone Encounter (Signed)
Called and spoke with patient who states he is NOT taking pravastatin. States he couldn't tolerate it because it made his joints hurt all over. Says he's tried multiple statin medications in the past and couldn't tolerate any of them. He is willing to see lipid clinic to discuss other options, including potential PCSK9i. Referral placed at this time.

## 2022-11-15 NOTE — Telephone Encounter (Signed)
-----   Message from Thayer Headings, MD sent at 11/14/2022  9:22 AM EST ----- Joseph Foster are elevated but still elevated LDL is 138 ( on Pravachol)   BMP is stable   DC pravachol  Start Rosuvastatin 20 mg a day  Lipids and ALT in 3 months

## 2022-12-09 ENCOUNTER — Other Ambulatory Visit: Payer: Self-pay | Admitting: Cardiovascular Disease

## 2022-12-26 ENCOUNTER — Ambulatory Visit: Payer: Medicare Other | Attending: Internal Medicine | Admitting: Pharmacist

## 2022-12-26 DIAGNOSIS — E785 Hyperlipidemia, unspecified: Secondary | ICD-10-CM | POA: Insufficient documentation

## 2022-12-26 NOTE — Assessment & Plan Note (Addendum)
Assessment:  Patient reported intolerance to all statins (muscle aches/pains in legs) Patient reported drinking at least 3 shots of bourbon per night  Patient reported going to the gym and lifting weights 3x/week. He is limited by his left knee, so does not partake in much cardio.  Discussed the addition of a PCSK-9 inhibitor or inclisiran for LDL lowering.  Patient has a supplemental health care plan that should cover remainder of what Medicare does not for inclisiran.   Plan:  We will submit inclisiran start form to [employer].  Encouraged patient to continue exercise and diet for decreasing LDL and CV risk reduction - specifically reducing alcohol to 2 shots/night and limiting saturated fats (bacon, pork chops, rib eye steak). Pt not willing to decrease alcohol completely. Will plan to follow-up via telephone with results of inclisiran submission
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
## 2022-12-26 NOTE — Patient Instructions (Signed)
We will follow up with approval for Leqvio (Inclisiran). Try to decrease bourbon intake to 2 shots/day

## 2023-01-03 ENCOUNTER — Telehealth: Payer: Self-pay | Admitting: Pharmacist

## 2023-01-03 ENCOUNTER — Telehealth: Payer: Self-pay | Admitting: Pharmacy Technician

## 2023-01-03 ENCOUNTER — Other Ambulatory Visit: Payer: Self-pay | Admitting: Pharmacist

## 2023-01-03 NOTE — Telephone Encounter (Signed)
Leqvio covered 80% by medicare and the remainder 20% by his plan F supplement. Referral to [facility]. Infusion center placed. Patient made aware. Will plan to do labs after 2nd injection.

## 2023-01-03 NOTE — Telephone Encounter (Signed)
Auth Submission: NO AUTH NEEDED Site of care: Site of care: [facility] Payer: MEDICARE [place] & [employer] [place] Medication & CPT/J Code(s) submitted: Leqvio (Inclisiran) [phone] Route of submission (phone, fax, portal):  Phone # Fax # Auth type: Buy/Bill Units/visits requested: 2 Reference number: leqvio service center Approval from: 01/03/23 to 09/30/23  Medicare will cover 80% and Supp will pick-up remaining 20%. Patient has met medicare deductible. Wilber Bihari will be covered 100%  Patient will be scheduled as soon as possible.

## 2023-01-15 ENCOUNTER — Other Ambulatory Visit: Payer: Self-pay

## 2023-01-15 MED ORDER — CARVEDILOL 6.25 MG PO TABS: 6.2500 mg | ORAL_TABLET | Freq: Two times a day (BID) | ORAL | 3 refills | Status: DC

## 2023-01-15 NOTE — Telephone Encounter (Signed)
Pt's medication was sent to pt's pharmacy as requested. Confirmation received.  °

## 2023-01-20 ENCOUNTER — Ambulatory Visit (INDEPENDENT_AMBULATORY_CARE_PROVIDER_SITE_OTHER): Payer: Medicare Other

## 2023-01-20 VITALS — BP 126/73 | HR 78 | Temp 98.2°F | Resp 18 | Ht 74.0 in | Wt 273.4 lb

## 2023-01-20 DIAGNOSIS — E785 Hyperlipidemia, unspecified: Secondary | ICD-10-CM

## 2023-01-20 DIAGNOSIS — G459 Transient cerebral ischemic attack, unspecified: Secondary | ICD-10-CM | POA: Diagnosis not present

## 2023-01-20 DIAGNOSIS — I2510 Atherosclerotic heart disease of native coronary artery without angina pectoris: Secondary | ICD-10-CM | POA: Diagnosis not present

## 2023-01-20 MED ORDER — INCLISIRAN SODIUM 284 MG/1.5ML ~~LOC~~ SOSY
284.0000 mg | PREFILLED_SYRINGE | Freq: Once | SUBCUTANEOUS | Status: AC
  Administered 2023-01-20: 284 mg via SUBCUTANEOUS
  Filled 2023-01-20: qty 1.5

## 2023-01-20 NOTE — Progress Notes (Signed)
Diagnosis: Hyperlipidemia  Provider:  Chilton Greathouse MD  Procedure: Injection  Leqvio (inclisiran), Dose: 284 mg, Site: subcutaneous, Number of injections: 1  Post Care: Observation period completed  Discharge: Condition: Good, Destination: Home . AVS Declined  Performed by:  Loney Hering, LPN

## 2023-03-13 ENCOUNTER — Other Ambulatory Visit: Payer: Self-pay | Admitting: Family Medicine

## 2023-03-13 DIAGNOSIS — R911 Solitary pulmonary nodule: Secondary | ICD-10-CM

## 2023-04-18 ENCOUNTER — Telehealth: Payer: Self-pay | Admitting: Pharmacist

## 2023-04-18 DIAGNOSIS — E782 Mixed hyperlipidemia: Secondary | ICD-10-CM

## 2023-04-18 NOTE — Telephone Encounter (Signed)
Patient will get labs done at apt in Aug. 05/27/23 Lipid labs. He will get his second leqvio shot next week.

## 2023-04-22 ENCOUNTER — Ambulatory Visit (INDEPENDENT_AMBULATORY_CARE_PROVIDER_SITE_OTHER): Payer: Medicare Other

## 2023-04-22 VITALS — BP 149/100 | HR 81 | Temp 98.0°F | Resp 20 | Ht 74.0 in | Wt 278.0 lb

## 2023-04-22 DIAGNOSIS — G459 Transient cerebral ischemic attack, unspecified: Secondary | ICD-10-CM

## 2023-04-22 DIAGNOSIS — I2510 Atherosclerotic heart disease of native coronary artery without angina pectoris: Secondary | ICD-10-CM | POA: Diagnosis not present

## 2023-04-22 DIAGNOSIS — E785 Hyperlipidemia, unspecified: Secondary | ICD-10-CM

## 2023-04-22 MED ORDER — INCLISIRAN SODIUM 284 MG/1.5ML ~~LOC~~ SOSY
284.0000 mg | PREFILLED_SYRINGE | Freq: Once | SUBCUTANEOUS | Status: AC
  Administered 2023-04-22: 284 mg via SUBCUTANEOUS
  Filled 2023-04-22: qty 1.5

## 2023-04-22 NOTE — Progress Notes (Signed)
Diagnosis: Hyperlipidemia  Provider:  Chilton Greathouse MD  Procedure: Injection  Leqvio (inclisiran), Dose: 284 mg, Site: subcutaneous, Number of injections: 1   Discharge: Condition: Good, Destination: Home . AVS Declined  Performed by:  Nat Math, RN

## 2023-04-23 ENCOUNTER — Other Ambulatory Visit: Payer: Self-pay | Admitting: Cardiovascular Disease

## 2023-04-24 ENCOUNTER — Ambulatory Visit
Admission: RE | Admit: 2023-04-24 | Discharge: 2023-04-24 | Disposition: A | Discharge status: Home / Self Care | Payer: Medicare Other | Source: Ambulatory Visit | Attending: Family Medicine | Admitting: Family Medicine

## 2023-04-24 DIAGNOSIS — R911 Solitary pulmonary nodule: Secondary | ICD-10-CM

## 2023-05-22 NOTE — Progress Notes (Signed)
Cardiology Office Note:  .   Date:  05/27/2023  ID:  Joseph Foster, DOB 18-Nov-1945, MRN [id number] PCP: [facility] Providers Cardiologist:  Kristeen Miss, MD    Patient Profile: .      PMH Hypertension Chronic combined CHF Echo in 2015 with mildly reduced LVEF 45-50%, G1DD Mildly elevated LVEDP on cath 2017 Echo 04/2018 EF 50-55%, G1DD Hyperlipidemia CAD Heart catheterization 04/29/2016 Mild LAD stenosis COPD Anxiety Former tobacco abuse Depression TIA in 2015 Morbid obesity Daily etoh use Statin myalgia Muscle aches/pains in legs to multiple statins  He has been followed by Dr. Elease Hashimoto for many years for CHF and hypertension.  Heart catheterization 04/29/2016 revealed mild LAD stenosis, normal LV systolic function and elevated LVEDP.  He was started on Entresto for CHF.  Last seen by Dr. Elease Hashimoto on 11/13/2022 at which time he reported he continued to go to the gym 3 days/week.  LDL was 138 and he reported intolerance to all statins with the exception of pravastatin.  He was referred to lipid clinic.  Referred to lipid clinic for management of hyperlipidemia and seen by Malena Peer, RPH on 12/26/22.  He was agreeable to try PCSK9 inhibitor.  Wilber Bihari was covered by his insurance plan and he started injections.  He is scheduled to get recheck of lipid panel 05/27/23.       History of Present Illness: .   Joseph Foster is a pleasant 77 y.o. male who is here today for follow-up. He reports bilateral discomfort in feet secondary to neuropathy which makes walking difficult. Reports he cannot walk in a straight line. He does not monitor BP at home but reports it is rarely > 130s mmHg at other appointments. Continues to go to the gym 3 days per week but only does upper body exercise. Does not do any cardio exercise because of foot pain. He denies chest pain, shortness of breath, lower extremity edema, fatigue, palpitations, melena,  presyncope, syncope, orthopnea, and PND. Reports he continues to struggle with weight although he does not eat much during the day. Avoids sugar. Does drink bourbon nightly.   ROS: See HPI       Studies Reviewed: .         Risk Assessment/Calculations:             Physical Exam:   VS:  BP 110/68 (BP Location: Left Arm, Patient Position: Sitting, Cuff Size: Normal)   Pulse 80   Ht 6\' 2"  (1.88 m)   Wt 276 lb 6.4 oz (125.4 kg)   SpO2 96%   BMI 35.49 kg/m    Wt Readings from Last 3 Encounters:  05/27/23 276 lb 6.4 oz (125.4 kg)  04/22/23 278 lb (126.1 kg)  01/20/23 273 lb 6.4 oz (124 kg)    GEN: Well nourished, well developed in no acute distress NECK: No JVD; No carotid bruits CARDIAC: Distant, RRR, no murmurs, rubs, gallops RESPIRATORY:  Clear to auscultation without rales, wheezing or rhonchi  ABDOMEN: Soft, non-tender, non-distended EXTREMITIES:  No edema; No deformity     ASSESSMENT AND PLAN: .    Chronic combined CHF: Most recent echo 2019 with mildly reduced LVEF 50 to 55%, G1 DD.  He denies shortness of breath, dyspnea, orthopnea, PND, edema.  Body habitus makes it difficult to assess volume status but no obvious indication. Stable renal function on labs completed 03/12/23.  We will continue GDMT including Entresto, carvedilol.   Hypertension: BP is  well controlled. No medication changes today.  CAD: Nonobstructive CAD on cardiac cath 03/2016 (20% ost LAD). He denies chest pain, dyspnea, or other symptoms concerning for angina.  No indication for further ischemic evaluation at this time.  Encouraged secondary prevention including 150 minutes of moderate intensity exercise each week in addition to his current strength training. Rechecking cholesterol today.   Hyperlipidemia LDL goal < 70: History of statin intolerance. On Leqvio, has had 2 injections with no adverse side effects. We will get repeat fasting lipid panel today.        Dispo: 1 year with Dr.  Elease Hashimoto  Signed, Eligha Bridegroom, NP-C

## 2023-05-27 ENCOUNTER — Ambulatory Visit: Payer: Medicare Other | Admitting: Nurse Practitioner

## 2023-05-27 ENCOUNTER — Encounter: Payer: Self-pay | Admitting: Nurse Practitioner

## 2023-05-27 ENCOUNTER — Ambulatory Visit: Payer: Medicare Other

## 2023-05-27 VITALS — BP 110/68 | HR 80 | Ht 74.0 in | Wt 276.4 lb

## 2023-05-27 DIAGNOSIS — I5042 Chronic combined systolic (congestive) and diastolic (congestive) heart failure: Secondary | ICD-10-CM | POA: Diagnosis present

## 2023-05-27 DIAGNOSIS — I2510 Atherosclerotic heart disease of native coronary artery without angina pectoris: Secondary | ICD-10-CM | POA: Insufficient documentation

## 2023-05-27 DIAGNOSIS — I10 Essential (primary) hypertension: Secondary | ICD-10-CM | POA: Insufficient documentation

## 2023-05-27 DIAGNOSIS — E785 Hyperlipidemia, unspecified: Secondary | ICD-10-CM | POA: Diagnosis present

## 2023-05-27 DIAGNOSIS — Z789 Other specified health status: Secondary | ICD-10-CM | POA: Diagnosis present

## 2023-05-27 DIAGNOSIS — E782 Mixed hyperlipidemia: Secondary | ICD-10-CM | POA: Diagnosis present

## 2023-05-27 NOTE — Patient Instructions (Signed)
Medication Instructions:   Your physician recommends that you continue on your current medications as directed. Please refer to the Current Medication list given to you today.   *If you need a refill on your cardiac medications before your next appointment, please call your pharmacy*   Lab Work:  None ordered.  If you have labs (blood work) drawn today and your tests are completely normal, you will receive your results only by: MyChart Message (if you have MyChart) OR A paper copy in the mail If you have any lab test that is abnormal or we need to change your treatment, we will call you to review the results.   Testing/Procedures:  None ordered.   Follow-Up: At [facility], you and your health needs are our priority.  As part of our continuing mission to provide you with exceptional heart care, we have created designated Provider Care Teams.  These Care Teams include your primary Cardiologist (physician) and Advanced Practice Providers (APPs -  Physician Assistants and Nurse Practitioners) who all work together to provide you with the care you need, when you need it.  We recommend signing up for the patient portal called "MyChart".  Sign up information is provided on this After Visit Summary.  MyChart is used to connect with patients for Virtual Visits (Telemedicine).  Patients are able to view lab/test results, encounter notes, upcoming appointments, etc.  Non-urgent messages can be sent to your provider as well.   To learn more about what you can do with MyChart, go to [email or web address].    Your next appointment:   1 year(s)  Provider:   Kristeen Miss, MD     Other Instructions  Your physician wants you to follow-up in: 1 year with Dr. Elease Hashimoto.  You will receive a reminder letter in the mail two months in advance. If you don't receive a letter, please call our office to schedule the follow-up appointment.   Adopting a Healthy Lifestyle.   Weight: Know what a  healthy weight is for you (roughly BMI <25) and aim to maintain this. You can calculate your body mass index on your smart phone. Unfortunately, this is not the most accurate measure of healthy weight, but it is the simplest measurement to use. A more accurate measurement involves body scanning which measures lean muscle, fat tissue and bony density. We do not have this equipment at [facility].    Diet: Aim for 7+ servings of fruits and vegetables daily Limit animal fats in diet for cholesterol and heart health - choose grass fed whenever available Avoid highly processed foods (fast food burgers, tacos, fried chicken, pizza, hot dogs, french fries)  Saturated fat comes in the form of butter, lard, coconut oil, margarine, partially hydrogenated oils, and fat in meat. These increase your risk of cardiovascular disease.  Use healthy plant oils, such as olive, canola, soy, corn, sunflower and peanut.  Whole foods such as fruits, vegetables and whole grains have fiber  Men need > 38 grams of fiber per day Women need > 25 grams of fiber per day  Load up on vegetables and fruits - one-half of your plate: Aim for color and variety, and remember that potatoes dont count. Go for whole grains - one-quarter of your plate: Whole wheat, barley, wheat berries, quinoa, oats, brown rice, and foods made with them. If you want pasta, go with whole wheat pasta. Protein power - one-quarter of your plate: Fish, chicken, beans, and nuts are all healthy, versatile protein sources. Limit red  meat. You need carbohydrates for energy! The type of carbohydrate is more important than the amount. Choose carbohydrates such as vegetables, fruits, whole grains, beans, and nuts in the place of white rice, white pasta, potatoes (baked or fried), macaroni and cheese, cakes, cookies, and donuts.  If youre thirsty, drink water. Coffee and tea are good in moderation, but skip sugary drinks and limit milk and dairy products to one or two  daily servings. Keep sugar intake at 6 teaspoons or 24 grams or LESS       Exercise: Aim for 150 min of moderate intensity exercise weekly for heart health, and weights twice weekly for bone health Stay active - any steps are better than no steps! Aim for 7-9 hours of sleep daily

## 2023-05-28 LAB — LIPID PANEL
Chol/HDL Ratio: 2.9 ratio (ref 0.0–5.0)
Cholesterol, Total: 153 mg/dL (ref 100–199)
HDL: 53 mg/dL (ref >39)
LDL Chol Calc (NIH): 65 mg/dL (ref 0–99)
Triglycerides: 216 mg/dL — ABNORMAL HIGH (ref 0–149)
VLDL Cholesterol Cal: 35 mg/dL (ref 5–40)

## 2023-10-17 ENCOUNTER — Telehealth: Payer: Self-pay

## 2023-10-17 NOTE — Telephone Encounter (Signed)
Auth Submission: NO AUTH NEEDED Site of care: Site of care: [facility] Payer: MEDICARE [employer]B & [employer] [place] Medication & CPT/J Code(s) submitted: Leqvio (Inclisiran) [phone] Route of submission (phone, fax, portal):  Phone # Fax # Auth type: Buy/Bill Units/visits requested: 284mg  x 2 doses Reference number: leqvio service center Approval from: 01/03/23 to 10/30/24   Medicare will cover 80% and Supp will pick-up remaining 20%.

## 2023-10-23 ENCOUNTER — Ambulatory Visit: Payer: Medicare Other

## 2023-10-23 VITALS — BP 156/94 | HR 77 | Temp 98.1°F | Resp 14 | Ht 74.0 in | Wt 267.0 lb

## 2023-10-23 DIAGNOSIS — I2510 Atherosclerotic heart disease of native coronary artery without angina pectoris: Secondary | ICD-10-CM | POA: Diagnosis not present

## 2023-10-23 DIAGNOSIS — E785 Hyperlipidemia, unspecified: Secondary | ICD-10-CM

## 2023-10-23 DIAGNOSIS — G459 Transient cerebral ischemic attack, unspecified: Secondary | ICD-10-CM | POA: Diagnosis not present

## 2023-10-23 MED ORDER — INCLISIRAN SODIUM 284 MG/1.5ML ~~LOC~~ SOSY
284.0000 mg | PREFILLED_SYRINGE | Freq: Once | SUBCUTANEOUS | Status: AC
  Administered 2023-10-23: 284 mg via SUBCUTANEOUS
  Filled 2023-10-23: qty 1.5

## 2023-10-23 NOTE — Progress Notes (Signed)
Diagnosis: Hyperlipidemia  Provider:  Chilton Greathouse MD  Procedure: Injection  Leqvio (inclisiran), Dose: 284 mg, Site: subcutaneous, Number of injections: 1  Injection Site(s): Left arm  Post Care: Patient declined observation  Discharge: Condition: Good, Destination: Home . AVS Provided  Performed by:  Marlow Baars [place], RN

## 2023-12-02 ENCOUNTER — Other Ambulatory Visit: Payer: Self-pay | Admitting: Cardiovascular Disease

## 2023-12-04 ENCOUNTER — Telehealth: Payer: Self-pay | Admitting: Cardiovascular Disease

## 2023-12-04 MED ORDER — SACUBITRIL-VALSARTAN 24-26 MG PO TABS: 1.0000 | ORAL_TABLET | Freq: Two times a day (BID) | ORAL | 1 refills | Status: DC

## 2023-12-04 MED ORDER — CARVEDILOL 6.25 MG PO TABS: 6.2500 mg | ORAL_TABLET | Freq: Two times a day (BID) | ORAL | 1 refills | Status: DC

## 2023-12-04 NOTE — Telephone Encounter (Signed)
 Pt's medications were sent to pt's pharmacy as requested. Confirmation received.

## 2023-12-04 NOTE — Telephone Encounter (Signed)
*  STAT* If patient is at the pharmacy, call can be transferred to refill team.   1. Which medications need to be refilled? (please list name of each medication and dose if known) carvedilol (COREG) 6.25 MG tablet    sacubitril-valsartan (ENTRESTO) 24-26 MG    2. Which pharmacy/location (including street and city if local pharmacy) is medication to be sent to? [id number] - [place], [place] - [address]   3. Do they need a 30 day or 90 day supply? 90

## 2023-12-19 ENCOUNTER — Inpatient Hospital Stay (HOSPITAL_COMMUNITY)
Admission: EM | Admit: 2023-12-19 | Discharge: 2023-12-21 | DRG: 193 | Disposition: A | Discharge status: Home / Self Care | Attending: Internal Medicine | Admitting: Internal Medicine

## 2023-12-19 ENCOUNTER — Observation Stay (HOSPITAL_COMMUNITY)

## 2023-12-19 ENCOUNTER — Emergency Department (HOSPITAL_COMMUNITY)

## 2023-12-19 ENCOUNTER — Encounter (HOSPITAL_COMMUNITY): Payer: Self-pay | Admitting: Internal Medicine

## 2023-12-19 ENCOUNTER — Other Ambulatory Visit: Payer: Self-pay

## 2023-12-19 DIAGNOSIS — J9621 Acute and chronic respiratory failure with hypoxia: Secondary | ICD-10-CM

## 2023-12-19 DIAGNOSIS — Z880 Allergy status to penicillin: Secondary | ICD-10-CM

## 2023-12-19 DIAGNOSIS — Z79899 Other long term (current) drug therapy: Secondary | ICD-10-CM

## 2023-12-19 DIAGNOSIS — G4700 Insomnia, unspecified: Secondary | ICD-10-CM | POA: Diagnosis present

## 2023-12-19 DIAGNOSIS — Z801 Family history of malignant neoplasm of trachea, bronchus and lung: Secondary | ICD-10-CM

## 2023-12-19 DIAGNOSIS — Z882 Allergy status to sulfonamides status: Secondary | ICD-10-CM

## 2023-12-19 DIAGNOSIS — Z8546 Personal history of malignant neoplasm of prostate: Secondary | ICD-10-CM

## 2023-12-19 DIAGNOSIS — J101 Influenza due to other identified influenza virus with other respiratory manifestations: Secondary | ICD-10-CM | POA: Diagnosis not present

## 2023-12-19 DIAGNOSIS — Z8249 Family history of ischemic heart disease and other diseases of the circulatory system: Secondary | ICD-10-CM

## 2023-12-19 DIAGNOSIS — Z83438 Family history of other disorder of lipoprotein metabolism and other lipidemia: Secondary | ICD-10-CM

## 2023-12-19 DIAGNOSIS — Z9682 Presence of neurostimulator: Secondary | ICD-10-CM

## 2023-12-19 DIAGNOSIS — Z806 Family history of leukemia: Secondary | ICD-10-CM

## 2023-12-19 DIAGNOSIS — Z888 Allergy status to other drugs, medicaments and biological substances status: Secondary | ICD-10-CM

## 2023-12-19 DIAGNOSIS — Z885 Allergy status to narcotic agent status: Secondary | ICD-10-CM

## 2023-12-19 DIAGNOSIS — E785 Hyperlipidemia, unspecified: Secondary | ICD-10-CM | POA: Diagnosis present

## 2023-12-19 DIAGNOSIS — Z8673 Personal history of transient ischemic attack (TIA), and cerebral infarction without residual deficits: Secondary | ICD-10-CM

## 2023-12-19 DIAGNOSIS — F419 Anxiety disorder, unspecified: Secondary | ICD-10-CM | POA: Diagnosis present

## 2023-12-19 DIAGNOSIS — Z720 Tobacco use: Secondary | ICD-10-CM | POA: Diagnosis present

## 2023-12-19 DIAGNOSIS — I10 Essential (primary) hypertension: Secondary | ICD-10-CM | POA: Diagnosis present

## 2023-12-19 DIAGNOSIS — N179 Acute kidney failure, unspecified: Secondary | ICD-10-CM | POA: Diagnosis present

## 2023-12-19 DIAGNOSIS — Z96651 Presence of right artificial knee joint: Secondary | ICD-10-CM | POA: Diagnosis present

## 2023-12-19 DIAGNOSIS — J440 Chronic obstructive pulmonary disease with acute lower respiratory infection: Secondary | ICD-10-CM | POA: Diagnosis present

## 2023-12-19 DIAGNOSIS — R0602 Shortness of breath: Secondary | ICD-10-CM | POA: Diagnosis present

## 2023-12-19 DIAGNOSIS — I5042 Chronic combined systolic (congestive) and diastolic (congestive) heart failure: Secondary | ICD-10-CM | POA: Diagnosis present

## 2023-12-19 DIAGNOSIS — I2510 Atherosclerotic heart disease of native coronary artery without angina pectoris: Secondary | ICD-10-CM | POA: Diagnosis present

## 2023-12-19 DIAGNOSIS — Z6833 Body mass index (BMI) 33.0-33.9, adult: Secondary | ICD-10-CM

## 2023-12-19 DIAGNOSIS — I110 Hypertensive heart disease with heart failure: Secondary | ICD-10-CM | POA: Diagnosis present

## 2023-12-19 DIAGNOSIS — Y900 Blood alcohol level of less than 20 mg/100 ml: Secondary | ICD-10-CM | POA: Diagnosis present

## 2023-12-19 DIAGNOSIS — F1010 Alcohol abuse, uncomplicated: Secondary | ICD-10-CM | POA: Diagnosis present

## 2023-12-19 DIAGNOSIS — J111 Influenza due to unidentified influenza virus with other respiratory manifestations: Secondary | ICD-10-CM | POA: Diagnosis present

## 2023-12-19 DIAGNOSIS — E66811 Obesity, class 1: Secondary | ICD-10-CM | POA: Diagnosis present

## 2023-12-19 DIAGNOSIS — N400 Enlarged prostate without lower urinary tract symptoms: Secondary | ICD-10-CM | POA: Diagnosis present

## 2023-12-19 DIAGNOSIS — F32A Depression, unspecified: Secondary | ICD-10-CM | POA: Diagnosis present

## 2023-12-19 DIAGNOSIS — K219 Gastro-esophageal reflux disease without esophagitis: Secondary | ICD-10-CM | POA: Diagnosis present

## 2023-12-19 DIAGNOSIS — J441 Chronic obstructive pulmonary disease with (acute) exacerbation: Secondary | ICD-10-CM | POA: Diagnosis present

## 2023-12-19 LAB — CBC WITH DIFFERENTIAL/PLATELET
Abs Immature Granulocytes: 0.04 10*3/uL (ref 0.00–0.07)
Basophils Absolute: 0 10*3/uL (ref 0.0–0.1)
Basophils Relative: 0 %
Eosinophils Absolute: 0 10*3/uL (ref 0.0–0.5)
Eosinophils Relative: 1 %
HCT: 45.2 % (ref 39.0–52.0)
Hemoglobin: 15 g/dL (ref 13.0–17.0)
Immature Granulocytes: 1 %
Lymphocytes Relative: 9 %
Lymphs Abs: 0.7 10*3/uL (ref 0.7–4.0)
MCH: 31.9 pg (ref 26.0–34.0)
MCHC: 33.2 g/dL (ref 30.0–36.0)
MCV: 96.2 fL (ref 80.0–100.0)
Monocytes Absolute: 1.4 10*3/uL — ABNORMAL HIGH (ref 0.1–1.0)
Monocytes Relative: 18 %
Neutro Abs: 5.8 10*3/uL (ref 1.7–7.7)
Neutrophils Relative %: 71 %
Platelets: 192 10*3/uL (ref 150–400)
RBC: 4.7 MIL/uL (ref 4.22–5.81)
RDW: 13.1 % (ref 11.5–15.5)
WBC: 8.1 10*3/uL (ref 4.0–10.5)
nRBC: 0 % (ref 0.0–0.2)

## 2023-12-19 LAB — COMPREHENSIVE METABOLIC PANEL
ALT: 37 U/L (ref 0–44)
AST: 44 U/L — ABNORMAL HIGH (ref 15–41)
Albumin: 3.9 g/dL (ref 3.5–5.0)
Alkaline Phosphatase: 17 U/L — ABNORMAL LOW (ref 38–126)
Anion gap: 14 (ref 5–15)
BUN: 17 mg/dL (ref 8–23)
CO2: 25 mmol/L (ref 22–32)
Calcium: 9.3 mg/dL (ref 8.9–10.3)
Chloride: 98 mmol/L (ref 98–111)
Creatinine, Ser: 1.45 mg/dL — ABNORMAL HIGH (ref 0.61–1.24)
GFR, Estimated: 50 mL/min — ABNORMAL LOW (ref >60)
Glucose, Bld: 121 mg/dL — ABNORMAL HIGH (ref 70–99)
Potassium: 4.1 mmol/L (ref 3.5–5.1)
Sodium: 137 mmol/L (ref 135–145)
Total Bilirubin: 0.7 mg/dL (ref 0.0–1.2)
Total Protein: 7.3 g/dL (ref 6.5–8.1)

## 2023-12-19 LAB — RESP PANEL BY RT-PCR (RSV, FLU A&B, COVID)  RVPGX2
Influenza A by PCR: POSITIVE — AB
Influenza B by PCR: NEGATIVE
Resp Syncytial Virus by PCR: NEGATIVE
SARS Coronavirus 2 by RT PCR: NEGATIVE

## 2023-12-19 LAB — BRAIN NATRIURETIC PEPTIDE: B Natriuretic Peptide: 38.8 pg/mL (ref 0.0–100.0)

## 2023-12-19 LAB — TROPONIN I (HIGH SENSITIVITY)
Troponin I (High Sensitivity): 15 ng/L (ref <18)
Troponin I (High Sensitivity): 12 ng/L (ref <18)

## 2023-12-19 MED ORDER — SODIUM CHLORIDE 0.9% FLUSH
3.0000 mL | Freq: Two times a day (BID) | INTRAVENOUS | Status: DC
  Administered 2023-12-19 – 2023-12-21 (×4): 3 mL via INTRAVENOUS

## 2023-12-19 MED ORDER — LORAZEPAM 2 MG/ML IJ SOLN: 1.0000 mg | INTRAMUSCULAR | Status: DC | PRN

## 2023-12-19 MED ORDER — ADULT MULTIVITAMIN W/MINERALS CH
1.0000 | ORAL_TABLET | Freq: Every day | ORAL | Status: DC
  Administered 2023-12-20 – 2023-12-21 (×2): 1 via ORAL
  Filled 2023-12-19 (×2): qty 1

## 2023-12-19 MED ORDER — PREDNISONE 5 MG (21) PO TBPK: 5.0000 mg | ORAL_TABLET | ORAL | Status: DC

## 2023-12-19 MED ORDER — OSELTAMIVIR PHOSPHATE 75 MG PO CAPS
75.0000 mg | ORAL_CAPSULE | Freq: Two times a day (BID) | ORAL | Status: DC
Start: 2023-12-20 — End: 2023-12-21
  Administered 2023-12-20 – 2023-12-21 (×3): 75 mg via ORAL
  Filled 2023-12-19 (×3): qty 1

## 2023-12-19 MED ORDER — PREDNISONE 5 MG (21) PO TBPK: 10.0000 mg | ORAL_TABLET | Freq: Every evening | ORAL | Status: DC

## 2023-12-19 MED ORDER — PREDNISONE 20 MG PO TABS
60.0000 mg | ORAL_TABLET | Freq: Once | ORAL | Status: AC
  Administered 2023-12-19: 60 mg via ORAL
  Filled 2023-12-19: qty 3

## 2023-12-19 MED ORDER — AZITHROMYCIN 250 MG PO TABS
500.0000 mg | ORAL_TABLET | Freq: Once | ORAL | Status: AC
  Administered 2023-12-19: 500 mg via ORAL
  Filled 2023-12-19: qty 2

## 2023-12-19 MED ORDER — OSELTAMIVIR PHOSPHATE 75 MG PO CAPS
75.0000 mg | ORAL_CAPSULE | Freq: Once | ORAL | Status: AC
  Administered 2023-12-19: 75 mg via ORAL
  Filled 2023-12-19: qty 1

## 2023-12-19 MED ORDER — ALBUTEROL SULFATE (2.5 MG/3ML) 0.083% IN NEBU
2.5000 mg | INHALATION_SOLUTION | RESPIRATORY_TRACT | Status: DC | PRN
Start: 2023-12-19 — End: 2023-12-21

## 2023-12-19 MED ORDER — ALPRAZOLAM 0.25 MG PO TABS
0.5000 mg | ORAL_TABLET | Freq: Once | ORAL | Status: AC
  Administered 2023-12-19: 0.5 mg via ORAL
  Filled 2023-12-19: qty 2

## 2023-12-19 MED ORDER — ACETAMINOPHEN 650 MG RE SUPP: 650.0000 mg | Freq: Four times a day (QID) | RECTAL | Status: DC | PRN

## 2023-12-19 MED ORDER — PREDNISONE 5 MG (21) PO TBPK: 5.0000 mg | ORAL_TABLET | Freq: Three times a day (TID) | ORAL | Status: DC

## 2023-12-19 MED ORDER — NICOTINE 21 MG/24HR TD PT24
21.0000 mg | MEDICATED_PATCH | Freq: Every day | TRANSDERMAL | Status: DC
  Administered 2023-12-21: 21 mg via TRANSDERMAL
  Filled 2023-12-19 (×2): qty 1

## 2023-12-19 MED ORDER — MAGNESIUM SULFATE 2 GM/50ML IV SOLN
2.0000 g | Freq: Once | INTRAVENOUS | Status: AC
  Administered 2023-12-19: 2 g via INTRAVENOUS
  Filled 2023-12-19: qty 50

## 2023-12-19 MED ORDER — SODIUM CHLORIDE 0.9% FLUSH
3.0000 mL | Freq: Two times a day (BID) | INTRAVENOUS | Status: DC
  Administered 2023-12-20: 10 mL via INTRAVENOUS

## 2023-12-19 MED ORDER — HEPARIN SODIUM (PORCINE) 5000 UNIT/ML IJ SOLN
5000.0000 [IU] | Freq: Three times a day (TID) | INTRAMUSCULAR | Status: DC
  Administered 2023-12-19 – 2023-12-20 (×3): 5000 [IU] via SUBCUTANEOUS
  Filled 2023-12-19 (×3): qty 1

## 2023-12-19 MED ORDER — PREDNISONE 5 MG (21) PO TBPK: 10.0000 mg | ORAL_TABLET | Freq: Every morning | ORAL | Status: DC

## 2023-12-19 MED ORDER — LORAZEPAM 1 MG PO TABS: 1.0000 mg | ORAL_TABLET | ORAL | Status: DC | PRN

## 2023-12-19 MED ORDER — SODIUM CHLORIDE 0.9% FLUSH: 3.0000 mL | INTRAVENOUS | Status: DC | PRN

## 2023-12-19 MED ORDER — THIAMINE HCL 100 MG/ML IJ SOLN: 100.0000 mg | Freq: Every day | INTRAMUSCULAR | Status: DC

## 2023-12-19 MED ORDER — NITROGLYCERIN 0.4 MG SL SUBL: 0.4000 mg | SUBLINGUAL_TABLET | SUBLINGUAL | Status: DC | PRN

## 2023-12-19 MED ORDER — PREDNISONE 20 MG PO TABS: 40.0000 mg | ORAL_TABLET | Freq: Every day | ORAL | Status: DC

## 2023-12-19 MED ORDER — THIAMINE MONONITRATE 100 MG PO TABS
100.0000 mg | ORAL_TABLET | Freq: Every day | ORAL | Status: DC
  Administered 2023-12-19 – 2023-12-21 (×3): 100 mg via ORAL
  Filled 2023-12-19 (×3): qty 1

## 2023-12-19 MED ORDER — ACETAMINOPHEN 325 MG PO TABS: 650.0000 mg | ORAL_TABLET | Freq: Four times a day (QID) | ORAL | Status: DC | PRN

## 2023-12-19 MED ORDER — FOLIC ACID 1 MG PO TABS
1.0000 mg | ORAL_TABLET | Freq: Every day | ORAL | Status: DC
  Administered 2023-12-19 – 2023-12-21 (×3): 1 mg via ORAL
  Filled 2023-12-19 (×3): qty 1

## 2023-12-19 MED ORDER — PREDNISONE 5 MG (21) PO TBPK: 5.0000 mg | ORAL_TABLET | Freq: Four times a day (QID) | ORAL | Status: DC

## 2023-12-19 MED ORDER — CARVEDILOL 6.25 MG PO TABS
6.2500 mg | ORAL_TABLET | Freq: Two times a day (BID) | ORAL | Status: DC
  Administered 2023-12-19 – 2023-12-21 (×4): 6.25 mg via ORAL
  Filled 2023-12-19: qty 2
  Filled 2023-12-19 (×3): qty 1

## 2023-12-19 MED ORDER — IPRATROPIUM-ALBUTEROL 0.5-2.5 (3) MG/3ML IN SOLN
3.0000 mL | Freq: Once | RESPIRATORY_TRACT | Status: AC
  Administered 2023-12-19: 3 mL via RESPIRATORY_TRACT
  Filled 2023-12-19: qty 3

## 2023-12-19 MED ORDER — TAMSULOSIN HCL 0.4 MG PO CAPS
0.4000 mg | ORAL_CAPSULE | Freq: Every day | ORAL | Status: DC
  Administered 2023-12-19 – 2023-12-20 (×2): 0.4 mg via ORAL
  Filled 2023-12-19 (×2): qty 1

## 2023-12-19 MED ORDER — AZITHROMYCIN 500 MG PO TABS
500.0000 mg | ORAL_TABLET | Freq: Every day | ORAL | Status: DC
  Administered 2023-12-20 – 2023-12-21 (×2): 500 mg via ORAL
  Filled 2023-12-19 (×2): qty 1

## 2023-12-19 MED ORDER — PREDNISONE 5 MG (21) PO TBPK
10.0000 mg | ORAL_TABLET | Freq: Every morning | ORAL | Status: DC
  Filled 2023-12-19: qty 21
  Filled 2023-12-19: qty 2

## 2023-12-19 NOTE — Assessment & Plan Note (Signed)
 Nicotine patch
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
## 2023-12-19 NOTE — Assessment & Plan Note (Addendum)
 Cont coreg. Stable no chest pain.  PRN NTG.

## 2023-12-19 NOTE — ED Triage Notes (Signed)
 Pt bib [facility] for SOB from PCP office. Pt was experiencing cough and sob, Pt reports that he feels like he has a fever. [facility] was concerned about pt due to patients O2 sats going down to 83% RA while talking.    EMS states patient was at 88 % RA upon arrival.  Pt has recently been around granddaughter who was sick with flu and strep.   Hx COPD, CHF Patients does not wear o2 at home   100 HR  97 % 4lnc  112/80 120 cbg

## 2023-12-19 NOTE — Assessment & Plan Note (Addendum)
 Will add level, thiamine.

## 2023-12-19 NOTE — Assessment & Plan Note (Addendum)
 Pt start on Tamiflu full dose as this is only mild aki.

## 2023-12-19 NOTE — Assessment & Plan Note (Addendum)
 Vitals:   12/19/23 1439 12/19/23 1530 12/19/23 1700 12/19/23 1730  BP: 112/80 105/82 128/73 111/76   12/19/23 1745 12/19/23 1830 12/19/23 1845 12/19/23 1900  BP: 94/75 (!) 112/58 108/62 (!) 109/36   12/19/23 1909  BP: 122/66  Cont coreg and entresto held.

## 2023-12-19 NOTE — Assessment & Plan Note (Signed)
 Euvolemic on exam.  Cont coreg / Sherryll Burger held due to AKI.

## 2023-12-19 NOTE — Progress Notes (Signed)
 [facility] night cross cover note:   I followed up on the result of this patient's CT chest, which showed no evidence of acute cardiopulmonary process.   Additionally, patient's RN conveys the patient's request for resumption of his home Xanax, Coreg, Entresto, and Flomax.   Per my brief chart review, it appears that his current presentation is associated with acute kidney injury.  As a result of this, I will hold his Sherryll Burger for now.  At this time, I have resumed his home Coreg and Flomax.  It appears that he has existing orders for prn Ativan as a component of CIWA protocol.  Consequently, I placed a one-time order for his home scheduled Xanax 0.5 mg po x 1 dose now.    Newton Pigg, DO Hospitalist

## 2023-12-19 NOTE — Assessment & Plan Note (Addendum)
 2/2 to influenza infection suspect PNA.  We will cont tamiflu and azithromycin along with steroids taper started in ED.  Aspiration precaution.

## 2023-12-19 NOTE — Progress Notes (Signed)
 [facility] night cross cover note:   I was notified by our inpatient pharmacist with request for consideration for switching existing order for prednisone Dosepak to prednisone 40 mg p.o. daily x 5 days.  I subsequently requested that prednisone Dosepak be discontinued in favor of prednisone 40 mg p.o. daily x 5 days, as above.     Newton Pigg, DO Hospitalist

## 2023-12-19 NOTE — Assessment & Plan Note (Addendum)
 Avoid contrast. Hold entresto.  Lab Results  Component Value Date   CREATININE 1.45 (H) 12/19/2023   CREATININE 1.12 11/13/2022   CREATININE 1.20 11/01/2021

## 2023-12-19 NOTE — ED Notes (Signed)
 PT provided with a sandwich bag and water.

## 2023-12-19 NOTE — Assessment & Plan Note (Addendum)
 Suspect 2/2 to flu a. D/D include pna or CHF.  We will do a dry scan and decide on echo.  No LE edema and clinically he is euvolemic.

## 2023-12-19 NOTE — ED Provider Notes (Addendum)
 [place] EMERGENCY DEPARTMENT AT [facility] Provider Note   CSN: [id number] Arrival date & time: 12/19/23  1433     History  Chief Complaint  Patient presents with   Shortness of Breath    Joseph Foster is a 78 y.o. male for 1 week of cough and shortness of breath, worsening over the past 2 days.  Patient went to PCP today and was found to be satting at approximately 88% on room air while talking.  Patient was sent to the ED for evaluation.  Of note, patient's granddaughter has been sick and was diagnosed with flu A and strep throat today.  Patient endorses his cough is productive of clear mucus.  He does not wear oxygen at baseline.  Felt feverish last night, however he did not have a thermometer to verify.  Endorses sore throat and abdominal pain while coughing.  Denies chest pain.  Denies dysuria, nausea, vomiting, diarrhea.  11 pound weight loss over several months.  Endorses lack of appetite x 3 days, however has been maintaining appropriate fluid intake.  HTN, HLD, TIA (on ASA 325), CHF EF 50-55% (2019), CAD, COPD (mild, no O2), spinal cord stimulator for chronic pain, prostate cancer (2020)  Shortness of Breath    Home Medications Prior to Admission medications   Medication Sig Start Date End Date Taking? Authorizing Provider  ALPRAZolam Prudy Feeler) 0.5 MG tablet Take 0.5 mg by mouth at bedtime.   Yes [provider]  carvedilol (COREG) 6.25 MG tablet Take 1 tablet (6.25 mg total) by mouth 2 (two) times daily. 12/04/23  Yes Nahser, Deloris Ping, MD  co-enzyme Q-10 30 MG capsule Take 30 mg by mouth daily. 02/09/20  Yes [provider]  esomeprazole (NEXIUM) 20 MG capsule Take 20 mg by mouth daily as needed (reflux).   Yes [provider]  fluticasone (FLONASE) 50 MCG/ACT nasal spray Place 1 spray into both nostrils in the morning and at bedtime.   Yes [provider]  inclisiran (LEQVIO) 284 MG/1.5ML SOSY injection Inject 284 mg into the  skin every 6 (six) months.   Yes [provider]  Multiple Vitamin (MULTIVITAMIN) tablet Take 1 tablet by mouth daily.   Yes [provider]  PARoxetine (PAXIL) 40 MG tablet Take 40 mg by mouth every morning.   Yes [provider]  sacubitril-valsartan (ENTRESTO) 24-26 MG Take 1 tablet by mouth 2 (two) times daily. 12/04/23  Yes Nahser, Deloris Ping, MD  tamsulosin (FLOMAX) 0.4 MG CAPS capsule Take 0.4 mg by mouth daily.   Yes [provider]      Allergies    Codeine, Dilaudid [hydromorphone hcl], Penicillins, Simvastatin, Ceclor [cefaclor], Oxycontin [oxycodone hcl], and Sulfa antibiotics    Review of Systems   Review of Systems  Respiratory:  Positive for shortness of breath.     Physical Exam Updated Vital Signs BP 94/75   Pulse 87   Temp 98 F (36.7 C)   Resp 15   Ht 6\' 2"  (1.88 m)   Wt 118.8 kg   SpO2 100%   BMI 33.64 kg/m  Physical Exam Vitals and nursing note reviewed.  Constitutional:      General: He is not in acute distress.    Appearance: Normal appearance. He is not ill-appearing.     Comments: Cough present  HENT:     Head: Normocephalic and atraumatic.     Mouth/Throat:     Lips: Pink. No lesions.     Pharynx: Oropharynx is  clear.  Eyes:     Conjunctiva/sclera: Conjunctivae normal.     Pupils: Pupils are equal, round, and reactive to light.  Cardiovascular:     Rate and Rhythm: Normal rate and regular rhythm.     Heart sounds: Normal heart sounds.  Pulmonary:     Effort: Pulmonary effort is normal.     Breath sounds: Decreased breath sounds and wheezing present.  Abdominal:     General: Abdomen is protuberant.     Palpations: Abdomen is soft.     Tenderness: There is no abdominal tenderness.  Musculoskeletal:     Right lower leg: No edema.     Left lower leg: No edema.  Skin:    General: Skin is warm and dry.     Capillary Refill: Capillary refill takes less than 2 seconds.  Neurological:     Mental Status: He is  alert.  Psychiatric:        Behavior: Behavior is cooperative.     ED Results / Procedures / Treatments   Labs (all labs ordered are listed, but only abnormal results are displayed) Labs Reviewed  RESP PANEL BY RT-PCR (RSV, FLU A&B, COVID)  [id number] - Abnormal; Notable for the following components:      Result Value   Influenza A by PCR POSITIVE (*)    All other components within normal limits  CBC WITH DIFFERENTIAL/PLATELET - Abnormal; Notable for the following components:   Monocytes Absolute 1.4 (*)    All other components within normal limits  COMPREHENSIVE METABOLIC PANEL - Abnormal; Notable for the following components:   Glucose, Bld 121 (*)    Creatinine, Ser 1.45 (*)    AST 44 (*)    Alkaline Phosphatase 17 (*)    GFR, Estimated 50 (*)    All other components within normal limits  BRAIN NATRIURETIC PEPTIDE  TROPONIN I (HIGH SENSITIVITY)  TROPONIN I (HIGH SENSITIVITY)    EKG EKG Interpretation Date/Time:  Friday December 19 2023 15:36:50 EDT Ventricular Rate:  90 PR Interval:  172 QRS Duration:  101 QT Interval:  370 QTC Calculation: 453 R Axis:   -56  Text Interpretation: Sinus rhythm Left anterior fascicular block Low voltage, precordial leads No significant change since last tracing Confirmed by Alvira Monday ([id number]) on 12/19/2023 4:54:46 PM  Radiology DG Chest 2 View Result Date: 12/19/2023 CLINICAL DATA:  Cough and shortness of breath. EXAM: CHEST - 2 VIEW COMPARISON:  04/24/2023 chest CT FINDINGS: The cardiomediastinal contours are normal. Mild bronchial thickening. Pulmonary vasculature is normal. No consolidation, pleural effusion, or pneumothorax. No acute osseous abnormalities are seen. Spinal stimulator in place. IMPRESSION: Mild bronchial thickening. Electronically Signed   By: Narda Rutherford M.D.   On: 12/19/2023 17:38    Procedures Procedures    Medications Ordered in ED Medications  magnesium sulfate IVPB 2 g 50 mL (2 g Intravenous New  Bag/Given 12/19/23 1752)  predniSONE (DELTASONE) tablet 60 mg (60 mg Oral Given 12/19/23 1743)  azithromycin (ZITHROMAX) tablet 500 mg (500 mg Oral Given 12/19/23 1743)  ipratropium-albuterol (DUONEB) 0.5-2.5 (3) MG/3ML nebulizer solution 3 mL (3 mLs Nebulization Given 12/19/23 1744)  oseltamivir (TAMIFLU) capsule 75 mg (75 mg Oral Given 12/19/23 1746)    ED Course/ Medical Decision Making/ A&P                               Medical Decision Making Amount and/or Complexity of Data Reviewed Labs: ordered. Radiology: ordered.  Risk Prescription drug management. Decision regarding hospitalization.   77 yoM hx HTN, HLD, TIA (on ASA 325), CHF EF 50-55% (2019), CAD, COPD (mild, no O2), spinal cord stimulator for chronic pain, prostate cancer (2020) who presents with cough and shortness of breath x 1 week.  Patient was found to be hypoxic at PCP at 88% on room air.  Patient was placed on 4 L and sent to the ED for evaluation.  Differential diagnosis currently includes COPD exacerbation, CHF exacerbation, pneumonia, viral illness, pleural effusion, pneumothorax, ACS, PE.  EKG is reassuring.  Sinus rhythm.  Appropriate PR, QRS, QTc intervals.  EKG does not show STEMI.  Similar EKG to 10/2022.  Initial troponin 15.  Given EKG and troponin, low suspicion for ACS.  Unable to be PERC'd out, however low suspicion for PE given Wells 1.5 points.  Given febrile symptoms and concern for infectious etiology, will hold off on D-dimer scan at this time.  CBC notable for no leukocytosis, stable hemoglobin, no electrolyte or metabolic derangements, glucose WNL, creatinine within patient's baseline.  CXR notable for no focal consolidation concerning for pneumonia, no pneumothorax, no pleural effusion, there is mild peribronchial thickening.  Influenza A positive.  Given the patient's flu symptoms have occurred in the past 2 days, will initiate patient on Tamiflu.  BNP 38; low suspicion for CHF exacerbation.   Suspicion for COPD exacerbation in the setting of viral illness.  Will treat as a COPD exacerbation with steroids, antibiotics, DuoNeb, and magnesium.  Patient will require admission for new oxygen requirement of 3 L.  Final Clinical Impression(s) / ED Diagnoses Final diagnoses:  Influenza A  COPD exacerbation (HCC)    Rx / DC Orders ED Discharge Orders     None      Renella Cunas, PGY 2 Emergency medicine   Renella Cunas, MD 12/19/23 1814    Renella Cunas, MD 12/19/23 7829    Alvira Monday, MD 12/22/23 2251

## 2023-12-20 DIAGNOSIS — F1010 Alcohol abuse, uncomplicated: Secondary | ICD-10-CM | POA: Diagnosis present

## 2023-12-20 DIAGNOSIS — F419 Anxiety disorder, unspecified: Secondary | ICD-10-CM | POA: Diagnosis present

## 2023-12-20 DIAGNOSIS — E66811 Obesity, class 1: Secondary | ICD-10-CM | POA: Diagnosis present

## 2023-12-20 DIAGNOSIS — N400 Enlarged prostate without lower urinary tract symptoms: Secondary | ICD-10-CM | POA: Diagnosis present

## 2023-12-20 DIAGNOSIS — E785 Hyperlipidemia, unspecified: Secondary | ICD-10-CM | POA: Diagnosis present

## 2023-12-20 DIAGNOSIS — N179 Acute kidney failure, unspecified: Secondary | ICD-10-CM | POA: Diagnosis present

## 2023-12-20 DIAGNOSIS — K219 Gastro-esophageal reflux disease without esophagitis: Secondary | ICD-10-CM | POA: Diagnosis present

## 2023-12-20 DIAGNOSIS — Z880 Allergy status to penicillin: Secondary | ICD-10-CM | POA: Diagnosis not present

## 2023-12-20 DIAGNOSIS — R0602 Shortness of breath: Secondary | ICD-10-CM

## 2023-12-20 DIAGNOSIS — I110 Hypertensive heart disease with heart failure: Secondary | ICD-10-CM | POA: Diagnosis present

## 2023-12-20 DIAGNOSIS — J111 Influenza due to unidentified influenza virus with other respiratory manifestations: Secondary | ICD-10-CM | POA: Diagnosis present

## 2023-12-20 DIAGNOSIS — Z96651 Presence of right artificial knee joint: Secondary | ICD-10-CM | POA: Diagnosis present

## 2023-12-20 DIAGNOSIS — J101 Influenza due to other identified influenza virus with other respiratory manifestations: Secondary | ICD-10-CM | POA: Diagnosis present

## 2023-12-20 DIAGNOSIS — J9621 Acute and chronic respiratory failure with hypoxia: Secondary | ICD-10-CM | POA: Diagnosis present

## 2023-12-20 DIAGNOSIS — Z8249 Family history of ischemic heart disease and other diseases of the circulatory system: Secondary | ICD-10-CM | POA: Diagnosis not present

## 2023-12-20 DIAGNOSIS — Z885 Allergy status to narcotic agent status: Secondary | ICD-10-CM | POA: Diagnosis not present

## 2023-12-20 DIAGNOSIS — Y900 Blood alcohol level of less than 20 mg/100 ml: Secondary | ICD-10-CM | POA: Diagnosis present

## 2023-12-20 DIAGNOSIS — Z882 Allergy status to sulfonamides status: Secondary | ICD-10-CM | POA: Diagnosis not present

## 2023-12-20 DIAGNOSIS — I2510 Atherosclerotic heart disease of native coronary artery without angina pectoris: Secondary | ICD-10-CM | POA: Diagnosis present

## 2023-12-20 DIAGNOSIS — F32A Depression, unspecified: Secondary | ICD-10-CM | POA: Diagnosis present

## 2023-12-20 DIAGNOSIS — Z83438 Family history of other disorder of lipoprotein metabolism and other lipidemia: Secondary | ICD-10-CM | POA: Diagnosis not present

## 2023-12-20 DIAGNOSIS — Z8546 Personal history of malignant neoplasm of prostate: Secondary | ICD-10-CM | POA: Diagnosis not present

## 2023-12-20 DIAGNOSIS — J440 Chronic obstructive pulmonary disease with acute lower respiratory infection: Secondary | ICD-10-CM | POA: Diagnosis present

## 2023-12-20 DIAGNOSIS — I5042 Chronic combined systolic (congestive) and diastolic (congestive) heart failure: Secondary | ICD-10-CM | POA: Diagnosis present

## 2023-12-20 DIAGNOSIS — Z801 Family history of malignant neoplasm of trachea, bronchus and lung: Secondary | ICD-10-CM | POA: Diagnosis not present

## 2023-12-20 DIAGNOSIS — Z888 Allergy status to other drugs, medicaments and biological substances status: Secondary | ICD-10-CM | POA: Diagnosis not present

## 2023-12-20 DIAGNOSIS — J441 Chronic obstructive pulmonary disease with (acute) exacerbation: Secondary | ICD-10-CM | POA: Diagnosis present

## 2023-12-20 LAB — COMPREHENSIVE METABOLIC PANEL
ALT: 31 U/L (ref 0–44)
AST: 45 U/L — ABNORMAL HIGH (ref 15–41)
Albumin: 3.5 g/dL (ref 3.5–5.0)
Alkaline Phosphatase: 16 U/L — ABNORMAL LOW (ref 38–126)
Anion gap: 10 (ref 5–15)
BUN: 15 mg/dL (ref 8–23)
CO2: 26 mmol/L (ref 22–32)
Calcium: 8.6 mg/dL — ABNORMAL LOW (ref 8.9–10.3)
Chloride: 100 mmol/L (ref 98–111)
Creatinine, Ser: 1.13 mg/dL (ref 0.61–1.24)
GFR, Estimated: >60 mL/min (ref >60)
Glucose, Bld: 126 mg/dL — ABNORMAL HIGH (ref 70–99)
Potassium: 4 mmol/L (ref 3.5–5.1)
Sodium: 136 mmol/L (ref 135–145)
Total Bilirubin: 0.6 mg/dL (ref 0.0–1.2)
Total Protein: 6.6 g/dL (ref 6.5–8.1)

## 2023-12-20 LAB — CBC
HCT: 41.4 % (ref 39.0–52.0)
Hemoglobin: 14.2 g/dL (ref 13.0–17.0)
MCH: 32.3 pg (ref 26.0–34.0)
MCHC: 34.3 g/dL (ref 30.0–36.0)
MCV: 94.1 fL (ref 80.0–100.0)
Platelets: 189 10*3/uL (ref 150–400)
RBC: 4.4 MIL/uL (ref 4.22–5.81)
RDW: 13.2 % (ref 11.5–15.5)
WBC: 6.3 10*3/uL (ref 4.0–10.5)
nRBC: 0 % (ref 0.0–0.2)

## 2023-12-20 LAB — ETHANOL: Alcohol, Ethyl (B): <10 mg/dL (ref <10)

## 2023-12-20 MED ORDER — METHYLPREDNISOLONE SODIUM SUCC 125 MG IJ SOLR
80.0000 mg | INTRAMUSCULAR | Status: DC
  Administered 2023-12-21: 80 mg via INTRAVENOUS
  Filled 2023-12-20: qty 2

## 2023-12-20 MED ORDER — IPRATROPIUM-ALBUTEROL 0.5-2.5 (3) MG/3ML IN SOLN
3.0000 mL | Freq: Four times a day (QID) | RESPIRATORY_TRACT | Status: DC
  Administered 2023-12-20 – 2023-12-21 (×4): 3 mL via RESPIRATORY_TRACT
  Filled 2023-12-20 (×4): qty 3

## 2023-12-20 MED ORDER — SACUBITRIL-VALSARTAN 24-26 MG PO TABS
1.0000 | ORAL_TABLET | Freq: Two times a day (BID) | ORAL | Status: DC
  Administered 2023-12-20 – 2023-12-21 (×3): 1 via ORAL
  Filled 2023-12-20 (×3): qty 1

## 2023-12-20 MED ORDER — LORAZEPAM 2 MG/ML IJ SOLN
0.5000 mg | Freq: Once | INTRAMUSCULAR | Status: AC | PRN
  Administered 2023-12-20: 0.5 mg via INTRAVENOUS
  Filled 2023-12-20: qty 1

## 2023-12-20 MED ORDER — MOMETASONE FURO-FORMOTEROL FUM 200-5 MCG/ACT IN AERO
2.0000 | INHALATION_SPRAY | Freq: Two times a day (BID) | RESPIRATORY_TRACT | Status: DC
  Administered 2023-12-20 – 2023-12-21 (×3): 2 via RESPIRATORY_TRACT
  Filled 2023-12-20: qty 8.8

## 2023-12-20 MED ORDER — PREDNISONE 20 MG PO TABS: 40.0000 mg | ORAL_TABLET | Freq: Every day | ORAL | 0 refills | Status: AC

## 2023-12-20 MED ORDER — METHYLPREDNISOLONE SODIUM SUCC 125 MG IJ SOLR
80.0000 mg | Freq: Once | INTRAMUSCULAR | Status: AC
  Administered 2023-12-20: 80 mg via INTRAVENOUS
  Filled 2023-12-20: qty 2

## 2023-12-20 MED ORDER — OSELTAMIVIR PHOSPHATE 75 MG PO CAPS: 75.0000 mg | ORAL_CAPSULE | Freq: Two times a day (BID) | ORAL | 0 refills | Status: AC

## 2023-12-20 NOTE — Plan of Care (Signed)

## 2023-12-20 NOTE — Progress Notes (Signed)
  Patient Saturations on Room Air at Rest = 93%  Patient Saturations on [employer] while Ambulating = 88%  Patient Saturations on 1 Liters of oxygen while Ambulating = 91%

## 2023-12-20 NOTE — Progress Notes (Signed)
 PROGRESS NOTE    Joseph Foster  ZOX:[id number] DOB: [birth year] DOA: 12/19/2023 PCP: [facility] [place]   Brief Narrative:  78 year old male with history of essential hypertension, TIA, chronic combined heart failure, CAD, tobacco abuse, COPD, anxiety/depression, BPH, GERD presented with worsening shortness of breath. On presentation, he was hypoxic on room air requiring supplemental oxygen. He tested positive for influenza. Creatinine was 1.45. Chest x-ray showed mild bronchial thickening. He was started on Tamiflu and steroids.   Assessment & Plan:   Influenza A acute bronchitis Acute respiratory failure with hypoxia -Treated with Tamiflu and steroids.  CT chest negative for acute abnormality. -Still requiring supplemental oxygen.  Wean off as able.  Continue Solu-Medrol.  Continue nebs.    Chronic combined systolic and diastolic heart failure Essential hypertension -Currently compensated.  Resume Entresto.  Continue Coreg.  Outpatient follow-up with cardiology.  Strict input and output.  Daily weights.  Fluid restriction.   BPH -Continue tamsulosin   Anxiety/depression -Will continue Xanax and paroxetine   GERD -Continue PPI   History of CAD -Stable.  No chest pain.  Continue Coreg.  Outpatient follow-up with cardiology   Obesity class I -Outpatient follow-up   DVT prophylaxis: Heparin subcutaneous Code Status: Full Family Communication: None at bedside Disposition Plan: Status is: Observation The patient will require care spanning > 2 midnights and should be moved to inpatient because: Of severity of illness    Consultants: None  Procedures: None  Antimicrobials:  Anti-infectives (From admission, onward)    Start     Dose/Rate Route Frequency Ordered Stop   12/20/23 1000  oseltamivir (TAMIFLU) capsule 75 mg        75 mg Oral 2 times daily 12/19/23 1909     12/20/23 1000  azithromycin (ZITHROMAX) tablet 500 mg        500 mg Oral Daily  12/19/23 1909     12/20/23 0000  oseltamivir (TAMIFLU) 75 MG capsule        75 mg Oral 2 times daily 12/20/23 1115 12/24/23 2359   12/19/23 1745  oseltamivir (TAMIFLU) capsule 75 mg        75 mg Oral  Once 12/19/23 1741 12/19/23 1746   12/19/23 1730  azithromycin (ZITHROMAX) tablet 500 mg        500 mg Oral  Once 12/19/23 1725 12/19/23 1743        Subjective: Patient seen and examined at bedside.  Feels slightly better but still short of breath with exertion.  No fever, vomiting, chest pain reported.  Objective: Vitals:   12/20/23 0500 12/20/23 0554 12/20/23 0853 12/20/23 1117  BP:  128/71 124/79 108/74  Pulse:  88 80 83  Resp:  17 18   Temp:  98.6 F (37 C) 98.2 F (36.8 C)   TempSrc:  Oral Oral   SpO2:  95% 95% 94%  Weight: 118.8 kg     Height:        Intake/Output Summary (Last 24 hours) at 12/20/2023 1136 Last data filed at 12/20/2023 0800 Gross per 24 hour  Intake 207 ml  Output 1050 ml  Net -843 ml   Filed Weights   12/19/23 1441 12/20/23 0500  Weight: 118.8 kg 118.8 kg    Examination:  General exam: Appears calm and comfortable.  On 2 L oxygen via nasal cannula. Respiratory system: Bilateral decreased breath sounds at bases Cardiovascular system: S1 & S2 heard, Rate controlled Gastrointestinal system: Abdomen is obese, nondistended, soft and nontender. Normal bowel sounds  heard. Extremities: No cyanosis, clubbing; trace lower extremity edema present  Central nervous system: Alert and oriented. No focal neurological deficits. Moving extremities Skin: No rashes, lesions or ulcers Psychiatry: Flat affect.  Not agitated.    Data Reviewed: I have personally reviewed following labs and imaging studies  CBC: Recent Labs  Lab 12/19/23 1514 12/20/23 0637  WBC 8.1 6.3  NEUTROABS 5.8  --   HGB 15.0 14.2  HCT 45.2 41.4  MCV 96.2 94.1  PLT 192 189   Basic Metabolic Panel: Recent Labs  Lab 12/19/23 1514 12/20/23 0637  NA 137 136  K 4.1 4.0  CL 98 100   CO2 25 26  GLUCOSE 121* 126*  BUN 17 15  CREATININE 1.45* 1.13  CALCIUM 9.3 8.6*   GFR: Estimated Creatinine Clearance: 77.3 mL/min (by C-G formula based on SCr of 1.13 mg/dL). Liver Function Tests: Recent Labs  Lab 12/19/23 1514 12/20/23 0637  AST 44* 45*  ALT 37 31  ALKPHOS 17* 16*  BILITOT 0.7 0.6  PROT 7.3 6.6  ALBUMIN 3.9 3.5   No results for input(s): "LIPASE", "AMYLASE" in the last 168 hours. No results for input(s): "AMMONIA" in the last 168 hours. Coagulation Profile: No results for input(s): "INR", "PROTIME" in the last 168 hours. Cardiac Enzymes: No results for input(s): "CKTOTAL", "CKMB", "CKMBINDEX", "TROPONINI" in the last 168 hours. BNP (last 3 results) No results for input(s): "PROBNP" in the last 8760 hours. HbA1C: No results for input(s): "HGBA1C" in the last 72 hours. CBG: No results for input(s): "GLUCAP" in the last 168 hours. Lipid Profile: No results for input(s): "CHOL", "HDL", "LDLCALC", "TRIG", "CHOLHDL", "LDLDIRECT" in the last 72 hours. Thyroid Function Tests: No results for input(s): "TSH", "T4TOTAL", "FREET4", "T3FREE", "THYROIDAB" in the last 72 hours. Anemia Panel: No results for input(s): "VITAMINB12", "FOLATE", "FERRITIN", "TIBC", "IRON", "RETICCTPCT" in the last 72 hours. Sepsis Labs: No results for input(s): "PROCALCITON", "LATICACIDVEN" in the last 168 hours.  Recent Results (from the past 240 hours)  Resp panel by RT-PCR (RSV, Flu A&B, Covid) Anterior Nasal Swab     Status: Abnormal   Collection Time: 12/19/23  3:23 PM   Specimen: Anterior Nasal Swab  Result Value Ref Range Status   SARS Coronavirus 2 by RT PCR NEGATIVE NEGATIVE Final   Influenza A by PCR POSITIVE (A) NEGATIVE Final   Influenza B by PCR NEGATIVE NEGATIVE Final    Comment: (NOTE) The Xpert Xpress SARS-CoV-2/FLU/RSV plus assay is intended as an aid in the diagnosis of influenza from Nasopharyngeal swab specimens and should not be used as a sole basis for  treatment. Nasal washings and aspirates are unacceptable for Xpert Xpress SARS-CoV-2/FLU/RSV testing.  Fact Sheet for Patients: BloggerCourse.com  Fact Sheet for Healthcare Providers: SeriousBroker.it  This test is not yet approved or cleared by the [place] FDA and has been authorized for detection and/or diagnosis of SARS-CoV-2 by FDA under an Emergency Use Authorization (EUA). This EUA will remain in effect (meaning this test can be used) for the duration of the COVID-19 declaration under Section 564(b)(1) of the Act, 21 U.S.C. section 360bbb-3(b)(1), unless the authorization is terminated or revoked.     Resp Syncytial Virus by PCR NEGATIVE NEGATIVE Final    Comment: (NOTE) Fact Sheet for Patients: BloggerCourse.com  Fact Sheet for Healthcare Providers: SeriousBroker.it  This test is not yet approved or cleared by the [place] FDA and has been authorized for detection and/or diagnosis of SARS-CoV-2 by FDA under an Emergency Use Authorization (EUA).  This EUA will remain in effect (meaning this test can be used) for the duration of the COVID-19 declaration under Section 564(b)(1) of the Act, 21 U.S.C. section 360bbb-3(b)(1), unless the authorization is terminated or revoked.  Performed at [facility], [address], [place], [place] [address]          Radiology Studies: CT CHEST WO CONTRAST Result Date: 12/19/2023 CLINICAL DATA:  Respiratory difficulty and shortness of breath EXAM: CT CHEST WITHOUT CONTRAST TECHNIQUE: Multidetector CT imaging of the chest was performed following the standard protocol without IV contrast. RADIATION DOSE REDUCTION: This exam was performed according to the departmental dose-optimization program which includes automated exposure control, adjustment of the mA and/or kV according to patient size and/or use of iterative  reconstruction technique. COMPARISON:  04/24/2023 CT, chest x-ray from earlier in the same day. FINDINGS: Cardiovascular: Somewhat limited due to lack of IV contrast. Atherosclerotic calcifications are seen. No aneurysmal dilatation is noted. Mediastinum/Nodes: Thoracic inlet is within normal limits. No hilar or mediastinal adenopathy is noted. The esophagus as visualized is within normal limits. Lungs/Pleura: Lungs are well aerated bilaterally. Tiny 2-3 mm nodule is noted in the left lower lobe stable in appearance from the prior exam. No further follow-up is recommended. Emphysematous changes are seen in the apices. Upper Abdomen: Visualized upper abdomen shows no acute abnormality. Musculoskeletal: Degenerative changes of the thoracic spine are noted. Old healed left rib fractures are noted. IMPRESSION: No acute abnormality noted. Aortic Atherosclerosis (ICD10-I70.0) and Emphysema (ICD10-J43.9). Electronically Signed   By: Alcide Clever M.D.   On: 12/19/2023 20:54   DG Chest 2 View Result Date: 12/19/2023 CLINICAL DATA:  Cough and shortness of breath. EXAM: CHEST - 2 VIEW COMPARISON:  04/24/2023 chest CT FINDINGS: The cardiomediastinal contours are normal. Mild bronchial thickening. Pulmonary vasculature is normal. No consolidation, pleural effusion, or pneumothorax. No acute osseous abnormalities are seen. Spinal stimulator in place. IMPRESSION: Mild bronchial thickening. Electronically Signed   By: Narda Rutherford M.D.   On: 12/19/2023 17:38        Scheduled Meds:  azithromycin  500 mg Oral Daily   carvedilol  6.25 mg Oral BID   folic acid  1 mg Oral Daily   heparin  5,000 Units Subcutaneous Q8H   multivitamin with minerals  1 tablet Oral Daily   nicotine  21 mg Transdermal Daily   oseltamivir  75 mg Oral BID   sacubitril-valsartan  1 tablet Oral BID   sodium chloride flush  3 mL Intravenous Q12H   sodium chloride flush  3-10 mL Intravenous Q12H   tamsulosin  0.4 mg Oral QHS   thiamine   100 mg Oral Daily   Or   thiamine  100 mg Intravenous Daily   Continuous Infusions:        Glade Lloyd, MD [facility] 12/20/2023, 11:36 AM

## 2023-12-20 NOTE — Plan of Care (Signed)
   Problem: Activity: Goal: Risk for activity intolerance will decrease Outcome: Progressing   Problem: Nutrition: Goal: Adequate nutrition will be maintained Outcome: Progressing   Problem: Coping: Goal: Level of anxiety will decrease Outcome: Progressing   Problem: Elimination: Goal: Will not experience complications related to bowel motility Outcome: Progressing

## 2023-12-20 NOTE — Progress Notes (Signed)
 [facility] night cross cover note:   I was notified by RN that the patient is requesting something for anxiety/sleep, noting that the patient typically takes Xanax at bedtime as an outpatient.  I subsequently placed an order for Ativan 0.5 mg IV x 1 dose prn for anxiety/sleep.     Newton Pigg, DO Hospitalist

## 2023-12-20 NOTE — Discharge Summary (Signed)
 Physician Discharge Summary  Joseph Foster UJW:[id number] DOB: [birth year] DOA: 12/19/2023  PCP: [facility] [place]  Admit date: 12/19/2023 Discharge date: 12/21/2023  Admitted From: Home Disposition: Home  Recommendations for Outpatient Follow-up:  Follow up with PCP in 1 week with repeat CBC/BMP Outpatient follow-up with cardiology Follow up in ED if symptoms worsen or new appear   Home Health: No Equipment/Devices: None  Discharge Condition: Stable CODE STATUS: Full Diet recommendation: Heart healthy/fluid restriction of up to 1200 cc a day  Brief/Interim Summary: 78 year old male with history of essential hypertension, TIA, chronic combined heart failure, CAD, tobacco abuse, COPD, anxiety/depression, BPH, GERD presented with worsening shortness of breath.  On presentation, he was hypoxic on room air requiring supplemental oxygen.  He tested positive for influenza.  Creatinine was 1.45.  Chest x-ray showed mild bronchial thickening.  He was started on Tamiflu and steroids.  During hospitalization, his condition is improved.  He feels better and feels okay to go home.  He will be discharged home today on oral Tamiflu to complete 5-day course along with prednisone 40 mg daily for 5 days.  Outpatient follow-up with PCP and cardiology.  Discharge Diagnoses:   Influenza A acute bronchitis Acute respiratory failure with hypoxia -Treated with Tamiflu and steroids.  CT chest negative for acute abnormality. -He feels better and feels okay to go home.  He will be discharged home today on oral Tamiflu to complete 5-day course along with prednisone 40 mg daily for 5 days.  Outpatient follow-up with PCP. -Currently on room air.  Chronic combined systolic and diastolic heart failure Essential hypertension -Currently compensated.  Continue Entresto and Coreg.  Outpatient follow-up with cardiology  BPH -Continue tamsulosin  Anxiety/depression -continue Xanax and  paroxetine  GERD -Continue PPI  History of CAD -Stable.  No chest pain.  Continue Coreg.  Outpatient follow-up with cardiology  Obesity class I -Outpatient follow-up   Discharge Instructions  Discharge Instructions     Ambulatory referral to Cardiology   Complete by: As directed    Hospital followup   Diet - low sodium heart healthy   Complete by: As directed    Increase activity slowly   Complete by: As directed       Allergies as of 12/20/2023       Reactions   Codeine Other (See Comments)   Hyper activity   Dilaudid [hydromorphone Hcl] Other (See Comments)   hallucinations   Penicillins Hives   Has patient had a PCN reaction causing immediate rash, facial/tongue/throat swelling, SOB or lightheadedness with hypotension: Yes Has patient had a PCN reaction causing severe rash involving mucus membranes or skin necrosis: No Has patient had a PCN reaction that required hospitalization No Has patient had a PCN reaction occurring within the last 10 years: N If all of the above answers are "NO", then may proceed with Cephalosporin use.   Simvastatin Other (See Comments)   MUSCLE ACHES   Ceclor [cefaclor] Itching, Rash   Oxycontin [oxycodone Hcl] Rash   Sulfa Antibiotics Rash        Medication List     TAKE these medications    ALPRAZolam 0.5 MG tablet Commonly known as: XANAX Take 0.5 mg by mouth at bedtime.   carvedilol 6.25 MG tablet Commonly known as: COREG Take 1 tablet (6.25 mg total) by mouth 2 (two) times daily.   co-enzyme Q-10 30 MG capsule Take 30 mg by mouth daily.   esomeprazole 20 MG capsule Commonly known as: NEXIUM  Take 20 mg by mouth daily as needed (reflux).   fluticasone 50 MCG/ACT nasal spray Commonly known as: FLONASE Place 1 spray into both nostrils in the morning and at bedtime.   Leqvio 284 MG/1.5ML Sosy injection Generic drug: inclisiran Inject 284 mg into the skin every 6 (six) months.   multivitamin tablet Take 1 tablet  by mouth daily.   oseltamivir 75 MG capsule Commonly known as: TAMIFLU Take 1 capsule (75 mg total) by mouth 2 (two) times daily for 4 days.   PARoxetine 40 MG tablet Commonly known as: PAXIL Take 40 mg by mouth every morning.   predniSONE 20 MG tablet Commonly known as: DELTASONE Take 2 tablets (40 mg total) by mouth daily with breakfast for 5 days. Start taking on: December 21, 2023   sacubitril-valsartan 24-26 MG Commonly known as: Entresto Take 1 tablet by mouth 2 (two) times daily.   tamsulosin 0.4 MG Caps capsule Commonly known as: FLOMAX Take 0.4 mg by mouth daily.        Follow-up Information     [facility] [place]. Schedule an appointment as soon as possible for a visit in 1 week(s).   Specialty: Family Medicine Contact information: [address] [place] [address] [phone]                Allergies  Allergen Reactions   Codeine Other (See Comments)    Hyper activity    Dilaudid [Hydromorphone Hcl] Other (See Comments)    hallucinations   Penicillins Hives    Has patient had a PCN reaction causing immediate rash, facial/tongue/throat swelling, SOB or lightheadedness with hypotension: Yes Has patient had a PCN reaction causing severe rash involving mucus membranes or skin necrosis: No Has patient had a PCN reaction that required hospitalization No Has patient had a PCN reaction occurring within the last 10 years: N  If all of the above answers are "NO", then may proceed with Cephalosporin use.    Simvastatin Other (See Comments)    MUSCLE ACHES   Ceclor [Cefaclor] Itching and Rash   Oxycontin [Oxycodone Hcl] Rash   Sulfa Antibiotics Rash    Consultations: None   Procedures/Studies: CT CHEST WO CONTRAST Result Date: 12/19/2023 CLINICAL DATA:  Respiratory difficulty and shortness of breath EXAM: CT CHEST WITHOUT CONTRAST TECHNIQUE: Multidetector CT imaging of the chest was performed following the standard  protocol without IV contrast. RADIATION DOSE REDUCTION: This exam was performed according to the departmental dose-optimization program which includes automated exposure control, adjustment of the mA and/or kV according to patient size and/or use of iterative reconstruction technique. COMPARISON:  04/24/2023 CT, chest x-ray from earlier in the same day. FINDINGS: Cardiovascular: Somewhat limited due to lack of IV contrast. Atherosclerotic calcifications are seen. No aneurysmal dilatation is noted. Mediastinum/Nodes: Thoracic inlet is within normal limits. No hilar or mediastinal adenopathy is noted. The esophagus as visualized is within normal limits. Lungs/Pleura: Lungs are well aerated bilaterally. Tiny 2-3 mm nodule is noted in the left lower lobe stable in appearance from the prior exam. No further follow-up is recommended. Emphysematous changes are seen in the apices. Upper Abdomen: Visualized upper abdomen shows no acute abnormality. Musculoskeletal: Degenerative changes of the thoracic spine are noted. Old healed left rib fractures are noted. IMPRESSION: No acute abnormality noted. Aortic Atherosclerosis (ICD10-I70.0) and Emphysema (ICD10-J43.9). Electronically Signed   By: Alcide Clever M.D.   On: 12/19/2023 20:54   DG Chest 2 View Result Date: 12/19/2023 CLINICAL DATA:  Cough  and shortness of breath. EXAM: CHEST - 2 VIEW COMPARISON:  04/24/2023 chest CT FINDINGS: The cardiomediastinal contours are normal. Mild bronchial thickening. Pulmonary vasculature is normal. No consolidation, pleural effusion, or pneumothorax. No acute osseous abnormalities are seen. Spinal stimulator in place. IMPRESSION: Mild bronchial thickening. Electronically Signed   By: Narda Rutherford M.D.   On: 12/19/2023 17:38      Subjective: Patient seen and examined at bedside.  Breathing and cough are improving.  Feels better enough to go home today.  No fever, chest pain or vomiting reported.  Discharge Exam: Vitals:    12/20/23 0554 12/20/23 0853  BP: 128/71 124/79  Pulse: 88 80  Resp: 17 18  Temp: 98.6 F (37 C) 98.2 F (36.8 C)  SpO2: 95% 95%    General: Pt is alert, awake, not in acute distress.  Looks chronically ill and deconditioned.  On room air Cardiovascular: rate controlled, S1/S2 + Respiratory: bilateral decreased breath sounds at bases with scattered crackles Abdominal: Soft, obese, NT, ND, bowel sounds + Extremities: Lower extremity edema; no cyanosis    The results of significant diagnostics from this hospitalization (including imaging, microbiology, ancillary and laboratory) are listed below for reference.     Microbiology: Recent Results (from the past 240 hours)  Resp panel by RT-PCR (RSV, Flu A&B, Covid) Anterior Nasal Swab     Status: Abnormal   Collection Time: 12/19/23  3:23 PM   Specimen: Anterior Nasal Swab  Result Value Ref Range Status   SARS Coronavirus 2 by RT PCR NEGATIVE NEGATIVE Final   Influenza A by PCR POSITIVE (A) NEGATIVE Final   Influenza B by PCR NEGATIVE NEGATIVE Final    Comment: (NOTE) The Xpert Xpress SARS-CoV-2/FLU/RSV plus assay is intended as an aid in the diagnosis of influenza from Nasopharyngeal swab specimens and should not be used as a sole basis for treatment. Nasal washings and aspirates are unacceptable for Xpert Xpress SARS-CoV-2/FLU/RSV testing.  Fact Sheet for Patients: BloggerCourse.com  Fact Sheet for Healthcare Providers: SeriousBroker.it  This test is not yet approved or cleared by the [place] FDA and has been authorized for detection and/or diagnosis of SARS-CoV-2 by FDA under an Emergency Use Authorization (EUA). This EUA will remain in effect (meaning this test can be used) for the duration of the COVID-19 declaration under Section 564(b)(1) of the Act, 21 U.S.C. section 360bbb-3(b)(1), unless the authorization is terminated or revoked.     Resp Syncytial Virus by  PCR NEGATIVE NEGATIVE Final    Comment: (NOTE) Fact Sheet for Patients: BloggerCourse.com  Fact Sheet for Healthcare Providers: SeriousBroker.it  This test is not yet approved or cleared by the [place] FDA and has been authorized for detection and/or diagnosis of SARS-CoV-2 by FDA under an Emergency Use Authorization (EUA). This EUA will remain in effect (meaning this test can be used) for the duration of the COVID-19 declaration under Section 564(b)(1) of the Act, 21 U.S.C. section 360bbb-3(b)(1), unless the authorization is terminated or revoked.  Performed at [facility], [address], [place], [place] [address]      Labs: BNP (last 3 results) Recent Labs    12/19/23 1517  BNP 38.8   Basic Metabolic Panel: Recent Labs  Lab 12/19/23 1514 12/20/23 0637  NA 137 136  K 4.1 4.0  CL 98 100  CO2 25 26  GLUCOSE 121* 126*  BUN 17 15  CREATININE 1.45* 1.13  CALCIUM 9.3 8.6*   Liver Function Tests: Recent Labs  Lab 12/19/23  1514 12/20/23 0637  AST 44* 45*  ALT 37 31  ALKPHOS 17* 16*  BILITOT 0.7 0.6  PROT 7.3 6.6  ALBUMIN 3.9 3.5   No results for input(s): "LIPASE", "AMYLASE" in the last 168 hours. No results for input(s): "AMMONIA" in the last 168 hours. CBC: Recent Labs  Lab 12/19/23 1514 12/20/23 0637  WBC 8.1 6.3  NEUTROABS 5.8  --   HGB 15.0 14.2  HCT 45.2 41.4  MCV 96.2 94.1  PLT 192 189   Cardiac Enzymes: No results for input(s): "CKTOTAL", "CKMB", "CKMBINDEX", "TROPONINI" in the last 168 hours. BNP: Invalid input(s): "POCBNP" CBG: No results for input(s): "GLUCAP" in the last 168 hours. D-Dimer No results for input(s): "DDIMER" in the last 72 hours. Hgb A1c No results for input(s): "HGBA1C" in the last 72 hours. Lipid Profile No results for input(s): "CHOL", "HDL", "LDLCALC", "TRIG", "CHOLHDL", "LDLDIRECT" in the last 72 hours. Thyroid function studies No results for  input(s): "TSH", "T4TOTAL", "T3FREE", "THYROIDAB" in the last 72 hours.  Invalid input(s): "FREET3" Anemia work up No results for input(s): "VITAMINB12", "FOLATE", "FERRITIN", "TIBC", "IRON", "RETICCTPCT" in the last 72 hours. Urinalysis    Component Value Date/Time   COLORURINE YELLOW 12/19/2014 1008   APPEARANCEUR CLEAR 12/19/2014 1008   LABSPEC 1.018 12/19/2014 1008   PHURINE 6.0 12/19/2014 1008   GLUCOSEU NEGATIVE 12/19/2014 1008   HGBUR TRACE (A) 12/19/2014 1008   BILIRUBINUR NEGATIVE 12/19/2014 1008   KETONESUR NEGATIVE 12/19/2014 1008   PROTEINUR NEGATIVE 12/19/2014 1008   UROBILINOGEN 0.2 12/19/2014 1008   NITRITE NEGATIVE 12/19/2014 1008   LEUKOCYTESUR NEGATIVE 12/19/2014 1008   Sepsis Labs Recent Labs  Lab 12/19/23 1514 12/20/23 0637  WBC 8.1 6.3   Microbiology Recent Results (from the past 240 hours)  Resp panel by RT-PCR (RSV, Flu A&B, Covid) Anterior Nasal Swab     Status: Abnormal   Collection Time: 12/19/23  3:23 PM   Specimen: Anterior Nasal Swab  Result Value Ref Range Status   SARS Coronavirus 2 by RT PCR NEGATIVE NEGATIVE Final   Influenza A by PCR POSITIVE (A) NEGATIVE Final   Influenza B by PCR NEGATIVE NEGATIVE Final    Comment: (NOTE) The Xpert Xpress SARS-CoV-2/FLU/RSV plus assay is intended as an aid in the diagnosis of influenza from Nasopharyngeal swab specimens and should not be used as a sole basis for treatment. Nasal washings and aspirates are unacceptable for Xpert Xpress SARS-CoV-2/FLU/RSV testing.  Fact Sheet for Patients: BloggerCourse.com  Fact Sheet for Healthcare Providers: SeriousBroker.it  This test is not yet approved or cleared by the [place] FDA and has been authorized for detection and/or diagnosis of SARS-CoV-2 by FDA under an Emergency Use Authorization (EUA). This EUA will remain in effect (meaning this test can be used) for the duration of the COVID-19  declaration under Section 564(b)(1) of the Act, 21 U.S.C. section 360bbb-3(b)(1), unless the authorization is terminated or revoked.     Resp Syncytial Virus by PCR NEGATIVE NEGATIVE Final    Comment: (NOTE) Fact Sheet for Patients: BloggerCourse.com  Fact Sheet for Healthcare Providers: SeriousBroker.it  This test is not yet approved or cleared by the [place] FDA and has been authorized for detection and/or diagnosis of SARS-CoV-2 by FDA under an Emergency Use Authorization (EUA). This EUA will remain in effect (meaning this test can be used) for the duration of the COVID-19 declaration under Section 564(b)(1) of the Act, 21 U.S.C. section 360bbb-3(b)(1), unless the authorization is terminated or revoked.  Performed at [facility]  [facility], [address], [place], [place] [address]      Time coordinating discharge: 35 minutes  SIGNED:   Glade Lloyd, MD  [facility] 12/21/2023, 10:11 AM

## 2023-12-21 DIAGNOSIS — R0602 Shortness of breath: Secondary | ICD-10-CM | POA: Diagnosis not present

## 2023-12-21 MED ORDER — ALBUTEROL SULFATE HFA 108 (90 BASE) MCG/ACT IN AERS: 2.0000 | INHALATION_SPRAY | RESPIRATORY_TRACT | 0 refills | Status: AC | PRN

## 2023-12-21 NOTE — Progress Notes (Signed)
SATURATION QUALIFICATIONS: (This note is used to comply with regulatory documentation for home oxygen)  Patient Saturations on Room Air at Rest = 95%  Patient Saturations on Room Air while Ambulating = 93%   

## 2024-03-01 ENCOUNTER — Encounter: Payer: Self-pay | Admitting: Cardiovascular Disease

## 2024-03-01 NOTE — Progress Notes (Signed)
 Cardiology Office Note   Date:  03/02/2024   ID:  Joseph Foster, DOB 1946/04/06, MRN [id number]  PCP:  Mordechai April, DO  Cardiologist:   Ahmad Alert, MD   Chief Complaint  Patient presents with   Congestive Heart Failure        Hypertension        Problem list 1. Essential hypertension 2. Combined systolic and diastolic congestive heart failure-  3. COPD 4. Anxiety/depression 5. Hyperlipidemia  6.  TIA - on ASA 325 for his TIA     Joseph Foster is a 78 y.o. male who presents for evaluation of CHF Has not had and dyspnea.   Walks 3-5 a day , Monday - Friday . No PND or orthopnea. No CP , no syncope.   Still eats some salt  - eats soup once a week, hot dogs once a month. Eats deli meats that have some salt   Non-smoker Drinks lots of beer -  4-5 a day.  More on the weekend.   April 17, 2016:;     Doing well. Walks every day - several miles No CP or dyspnea.  Some fatigue climbing a hill  Nov. 15, 2017:  Joseph Foster is seen Today for his combined systolic and diastolic congestive heart failure. Heart catheterization on 04/29/2016 reveals mild LAD stenosis. His LV systolic function was normal. He has grade 1 diastolic dysfunction by echo   Aug. 20, 2018:  Doing well.    Walks 5 days a week.  Rides his motorcycle on the weekends. EF is 45-50% Avoids salt.   Feb. 4, 2019:  Doing well.  Tolerating the Entresto  BP is low.     Gets dizzy after drinking several beers.    Sept. 24, 2019: Joseph Foster is seen back today for follow-up of his chronic combined systolic and diastolic congestive heart failure: Echocardiogram in 2015 revealed an ejection fraction of 45%.  Most recent echocardiogram in August, 2019 reveals normal left ventricular systolic function with an EF of 55%.  He has grade 1 diastolic dysfunction.  Walks daily ,  No CP or dyspnea.   Jan. 27, 2021;  Joseph Foster is seen back today for follow-up of his chronic combined systolic and diastolic congestive  heart failure. He was last seen 2 years ago. breathinig is ok Has some orthostatic hypotension .   Seems to have improved.  Nov. 18, 2021: Joseph Foster is seen back for follow up of his chronic combined CHF. Is having some orthostatic hypotension orthostasis resolved when he switched his tamulosin from daytime to night .  Breathing is good Work out in gym 3 times a week.  injurred his right foot  Labs from oct. Were reviewed.  Trigs are 225.  We discussed limiting his carbs   Feb. 2, 2023 Joseph Foster is seen for follow up of his CHF, is on Entreso  Is gaining some weight  Wt is 271 lbs  today  Works out regularly    Feb. 14, 2024 Joseph Foster is seen for follow up of his CHF , Wt is  269 lbs  No CP , no dyspnea  Goes to the gym 3 days a week   Advised calorie reduction, weight loss  Lipids were elevated this past year Will recheck today    March 02, 2024 Joseph Foster is seen for follow up of his CHF Wt is  257  lbs  Was recently hospitalized.  He was discharged on Tamiflu .  Is having some orthostatic hypotension He is on  entresto   He may do just as well on Valsartan  40 mg alone and eliminate the orthostatic hypotension   Advised to avoid salty foods    Past Medical History:  Diagnosis Date   Acute medial meniscus tear of left knee    Anxiety    Aortic dilatation (HCC) 05/12/2018   Mild, noted on ECHO   Arthritis KNEES   Chronic back pain    Chronic combined systolic and diastolic heart failure (HCC)    Closed left ankle fracture    COPD (chronic obstructive pulmonary disease) (HCC)    asymptomatic, denies shortness of breath   DDD (degenerative disc disease), lumbar L4   Depression    GERD (gastroesophageal reflux disease)    Grade I diastolic dysfunction 05/12/2018   Noted on ECHO   Hyperlipidemia    Hypertension    Insomnia    LAD stenosis 03/2016   Mild, noted on heart catheterization   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   Noted on EKG   Lung nodule 2010   Left lung  base nodule   Memory loss    Numbness and tingling in both hands    while riding motorcycle   Numbness and tingling of both feet    pinched nerve   Pneumonia    age 49's   Prostate cancer (HCC)    Ringing in ears    Resolved   TIA (transient ischemic attack) 2015    Past Surgical History:  Procedure Laterality Date   CARDIAC CATHETERIZATION N/A 04/29/2016   Procedure: Left Heart Cath and Coronary Angiography;  Surgeon: Arleen Lacer, MD;  Location: [facility] INVASIVE CV LAB;  Service: Cardiovascular;  Laterality: N/A;   COLONOSCOPY     CYSTOSCOPY N/A 03/15/2019   Procedure: CYSTOSCOPY;  Surgeon: Ottelin, Mark, MD;  Location: [facility];  Service: Urology;  Laterality: N/A;  2 seeds detected and removed by Dr. Grady Lawman   [facility] EEG 41-60MINS  08/23/2014       KNEE ARTHROSCOPY  01/15/2012   Procedure: ARTHROSCOPY KNEE;  Surgeon: Aurther Blue, MD;  Location: [facility];  Service: Orthopedics;  Laterality: Left;  meniscal debridement   ORIF ANKLE FRACTURE Left 12/07/2020   Procedure: OPEN REDUCTION INTERNAL FIXATION (ORIF) left lateral malleolus fracture; stress exam left foot;  Surgeon: Amada Backer, MD;  Location: [facility];  Service: Orthopedics;  Laterality: Left;    RADIOACTIVE SEED IMPLANT N/A 03/15/2019   Procedure: RADIOACTIVE SEED IMPLANT/BRACHYTHERAPY IMPLANT;  Surgeon: Ottelin, Mark, MD;  Location: [facility];  Service: Urology;  Laterality: N/A;  81 seeds   RIGHT KNEE SURG.  2005   SPINAL CORD STIMULATOR IMPLANT  2009   TONSILECTOMY/ADENOIDECTOMY WITH MYRINGOTOMY     TOTAL KNEE ARTHROPLASTY Right 12/26/2014   Procedure: TOTAL RIGHT  KNEE ARTHROPLASTY;  Surgeon: Liliane Rei, MD;  Location: [facility];  Service: Orthopedics;  Laterality: Right;   UPPER GI ENDOSCOPY     VASECTOMY       Current Outpatient Medications  Medication Sig Dispense Refill   albuterol  (VENTOLIN  HFA) 108 (90 Base) MCG/ACT inhaler Inhale 2  puffs into the lungs every 4 (four) hours as needed for wheezing or shortness of breath. 8 g 0   ALPRAZolam  (XANAX ) 0.5 MG tablet Take 0.5 mg by mouth at bedtime.     carvedilol  (COREG ) 6.25 MG tablet Take 1 tablet (6.25 mg total) by mouth 2 (two) times daily. 180 tablet 1   co-enzyme Q-10 30 MG capsule  Take 30 mg by mouth daily.     esomeprazole (NEXIUM) 20 MG capsule Take 20 mg by mouth daily as needed (reflux).     fluticasone (FLONASE) 50 MCG/ACT nasal spray Place 1 spray into both nostrils in the morning and at bedtime.     inclisiran (LEQVIO ) 284 MG/1.5ML SOSY injection Inject 284 mg into the skin every 6 (six) months.     Multiple Vitamin (MULTIVITAMIN) tablet Take 1 tablet by mouth daily.     PARoxetine  (PAXIL ) 40 MG tablet Take 40 mg by mouth every morning.     tamsulosin  (FLOMAX ) 0.4 MG CAPS capsule Take 0.4 mg by mouth daily.     valsartan  (DIOVAN ) 40 MG tablet Take 1 tablet (40 mg total) by mouth daily. 90 tablet 3   No current facility-administered medications for this visit.    Allergies:   Codeine, Dilaudid  [hydromorphone  hcl], Penicillins, Simvastatin , Ceclor [cefaclor], Oxycontin  [oxycodone  hcl], and Sulfa antibiotics    Social History:  The patient  reports that he quit smoking about 23 years ago. His smoking use included cigars and cigarettes. He started smoking about 43 years ago. He has a 5 pack-year smoking history. He has quit using smokeless tobacco.  His smokeless tobacco use included snuff. He reports current alcohol use of about 21.0 standard drinks of alcohol per week. He reports current drug use. Drug: Marijuana.   Family History:  The patient's family history includes Arthritis in his mother; Heart disease in his father; Hyperlipidemia in his brother and mother; Leukemia in his mother; Lung cancer in his father.    ROS:   See current history.  All other systems are negative   Physical Exam: Blood pressure 116/84, pulse 86, height 6\' 2"  (1.88 m), weight 257 lb  9.6 oz (116.8 kg), SpO2 96%.       GEN:  Well nourished, well developed in no acute distress HEENT: Normal NECK: No JVD; No carotid bruits LYMPHATICS: No lymphadenopathy CARDIAC: RRR , no murmurs, rubs, gallops RESPIRATORY:  Clear to auscultation without rales, wheezing or rhonchi  ABDOMEN: Soft, non-tender, non-distended MUSCULOSKELETAL:  No edema; No deformity  SKIN: Warm and dry NEUROLOGIC:  Alert and oriented x 3     EKG:         Recent Labs: 12/19/2023: B Natriuretic Peptide 38.8 12/20/2023: ALT 31; BUN 15; Creatinine, Ser 1.13; Hemoglobin 14.2; Platelets 189; Potassium 4.0; Sodium 136    Lipid Panel    Component Value Date/Time   CHOL 153 05/27/2023 0828   TRIG 216 (H) 05/27/2023 0828   HDL 53 05/27/2023 0828   CHOLHDL 2.9 05/27/2023 0828   CHOLHDL 3.2 08/23/2014 0618   VLDL 26 08/23/2014 0618   LDLCALC 65 05/27/2023 0828      Wt Readings from Last 3 Encounters:  03/02/24 257 lb 9.6 oz (116.8 kg)  12/21/23 260 lb (117.9 kg)  10/23/23 267 lb (121.1 kg)      Other studies Reviewed: Additional studies/ records that were reviewed today include: . Review of the above records demonstrates:    ASSESSMENT AND PLAN:  1. Essential hypertension -    BP is on the low side.  He was started on Entresto  for very mild LV dysfunction.  His LV function is now normal.  He may tolerate valsartan  40 mg a day better than the Entresto .  Will try him on valsartan  40 mg a day.  He will hold the Entresto .  He will let us  know if he feels better and if the orthostatic  hypotension improves with this change.   2. Chronic  Combined systolic and diastolic congestive heart failure-his last echocardiogram shows normal left ventricular systolic function.  He has mild diastolic dysfunction.    3. COPD  4. Anxiety/depression:   5. Hyperlipidemia -  .  He will follow-up with his primary medical doctor.    Ahmad Alert, MD  03/02/2024 5:35 PM    [facility]  [facility]Care [address] [place], [place], [place]  [address] Phone: [phone]; Fax: [phone]

## 2024-03-02 ENCOUNTER — Ambulatory Visit: Attending: Cardiovascular Disease | Admitting: Cardiovascular Disease

## 2024-03-02 VITALS — BP 116/84 | HR 86 | Ht 74.0 in | Wt 257.6 lb

## 2024-03-02 DIAGNOSIS — I5042 Chronic combined systolic (congestive) and diastolic (congestive) heart failure: Secondary | ICD-10-CM | POA: Diagnosis not present

## 2024-03-02 DIAGNOSIS — I10 Essential (primary) hypertension: Secondary | ICD-10-CM | POA: Insufficient documentation

## 2024-03-02 MED ORDER — VALSARTAN 40 MG PO TABS: 40.0000 mg | ORAL_TABLET | Freq: Every day | ORAL | 3 refills | Status: AC

## 2024-03-02 NOTE — Patient Instructions (Signed)
 Medication Instructions:  STOP Entresto  START Valsartan  40mg  (Diovan ) daily *If you need a refill on your cardiac medications before your next appointment, please call your pharmacy*  Follow-Up: At [facility], you and your health needs are our priority.  As part of our continuing mission to provide you with exceptional heart care, our providers are all part of one team.  This team includes your primary Cardiologist (physician) and Advanced Practice Providers or APPs (Physician Assistants and Nurse Practitioners) who all work together to provide you with the care you need, when you need it.  Your next appointment:   6 month(s)  Provider:   Ahmad Alert, MD or One of our Advanced Practice Providers (APPs): Melita Springer, PA-C  Friddie Jetty, NP Evaline Hill, NP  Theotis Flake, PA-C Lawana Pray, NP  Willis Harter, PA-C Lovette Rud, PA-C  [place], PA-C Ernest Dick, NP  Marlana Silvan, NP Marcie Sever, PA-C  Laquita Plant, PA-C    Dayna Dunn, PA-C  Marlyse Single, PA-C Palmer Bobo, NP Katlyn West, NP Callie Goodrich, PA-C  Evan Williams, PA-C Sheng Haley, PA-C  Xika Zhao, NP Kathleen Johnson, PA-C

## 2024-03-29 ENCOUNTER — Telehealth: Payer: Self-pay

## 2024-03-29 ENCOUNTER — Telehealth: Payer: Self-pay | Admitting: *Deleted

## 2024-03-29 NOTE — Telephone Encounter (Signed)
  Patient Consent for Virtual Visit        Joseph Foster has provided verbal consent on 03/29/2024 for a virtual visit (video or telephone).   CONSENT FOR VIRTUAL VISIT FOR:  Joseph Foster  By participating in this virtual visit I agree to the following:  I hereby voluntarily request, consent and authorize [facility] and its employed or contracted physicians, physician assistants, nurse practitioners or other licensed health care professionals (the Practitioner), to provide me with telemedicine health care services (the "Services) as deemed necessary by the treating Practitioner. I acknowledge and consent to receive the Services by the Practitioner via telemedicine. I understand that the telemedicine visit will involve communicating with the Practitioner through live audiovisual communication technology and the disclosure of certain medical information by electronic transmission. I acknowledge that I have been given the opportunity to request an in-person assessment or other available alternative prior to the telemedicine visit and am voluntarily participating in the telemedicine visit.  I understand that I have the right to withhold or withdraw my consent to the use of telemedicine in the course of my care at any time, without affecting my right to future care or treatment, and that the Practitioner or I may terminate the telemedicine visit at any time. I understand that I have the right to inspect all information obtained and/or recorded in the course of the telemedicine visit and may receive copies of available information for a reasonable fee.  I understand that some of the potential risks of receiving the Services via telemedicine include:  Delay or interruption in medical evaluation due to technological equipment failure or disruption; Information transmitted may not be sufficient (e.g. poor resolution of images) to allow for appropriate medical decision making by the  Practitioner; and/or  In rare instances, security protocols could fail, causing a breach of personal health information.  Furthermore, I acknowledge that it is my responsibility to provide information about my medical history, conditions and care that is complete and accurate to the best of my ability. I acknowledge that Practitioner's advice, recommendations, and/or decision may be based on factors not within their control, such as incomplete or inaccurate data provided by me or distortions of diagnostic images or specimens that may result from electronic transmissions. I understand that the practice of medicine is not an exact science and that Practitioner makes no warranties or guarantees regarding treatment outcomes. I acknowledge that a copy of this consent can be made available to me via my patient portal [facility] MyChart), or I can request a printed copy by calling the office of [facility].    I understand that my insurance will be billed for this visit.   I have read or had this consent read to me. I understand the contents of this consent, which adequately explains the benefits and risks of the Services being provided via telemedicine.  I have been provided ample opportunity to ask questions regarding this consent and the Services and have had my questions answered to my satisfaction. I give my informed consent for the services to be provided through the use of telemedicine in my medical care

## 2024-03-29 NOTE — Telephone Encounter (Signed)
   Pre-operative Risk Assessment    Patient Name: Joseph Foster  DOB: 1946/05/05 MRN: [id number]   Date of last office visit: 03/02/24 ALEENE PASSE, MD Date of next office visit: NONE   Request for Surgical Clearance    Procedure:  LEFT TOTAL KNEE ARTHROPLASTY  Date of Surgery:  Clearance 06/28/24                                Surgeon:  DR DEMPSEY MOAN Surgeon's Group or Practice Name:  [facility] Phone number:  [phone] Fax number:  [phone]  ATTN: KERRI MAZE   Type of Clearance Requested:   - Medical    Type of Anesthesia:  CHOICE   Additional requests/questions:    SignedLucie DELENA Ku   03/29/2024, 12:53 PM

## 2024-03-29 NOTE — Telephone Encounter (Signed)
   Name: Joseph Foster  DOB: 01-12-1946  MRN: [id number]  Primary Cardiologist: Aleene Passe, MD   Preoperative team, please contact this patient and set up a phone call appointment for further preoperative risk assessment. Please obtain consent and complete medication review. Thank you for your help.  Please schedule televisit closer to procedure date of 06/28/2024  I confirm that guidance regarding antiplatelet and oral anticoagulation therapy has been completed and, if necessary, noted below.  None  I also confirmed the patient resides in the state of [place] . As per [facility] Medical Board telemedicine laws, the patient must reside in the state in which the provider is licensed.   Wyn Raddle, Jackee Shove, NP 03/29/2024, 3:09 PM [name]

## 2024-04-21 ENCOUNTER — Ambulatory Visit: Payer: Medicare Other

## 2024-04-21 MED ORDER — INCLISIRAN SODIUM 284 MG/1.5ML ~~LOC~~ SOSY: 284.0000 mg | PREFILLED_SYRINGE | Freq: Once | SUBCUTANEOUS | Status: AC

## 2024-04-22 ENCOUNTER — Ambulatory Visit (INDEPENDENT_AMBULATORY_CARE_PROVIDER_SITE_OTHER)

## 2024-04-22 VITALS — BP 145/79 | HR 80 | Temp 97.9°F | Resp 16 | Ht 74.0 in | Wt 259.8 lb

## 2024-04-22 DIAGNOSIS — E785 Hyperlipidemia, unspecified: Secondary | ICD-10-CM | POA: Diagnosis not present

## 2024-04-22 DIAGNOSIS — G459 Transient cerebral ischemic attack, unspecified: Secondary | ICD-10-CM

## 2024-04-22 DIAGNOSIS — I2510 Atherosclerotic heart disease of native coronary artery without angina pectoris: Secondary | ICD-10-CM

## 2024-04-22 MED ORDER — INCLISIRAN SODIUM 284 MG/1.5ML ~~LOC~~ SOSY
284.0000 mg | PREFILLED_SYRINGE | Freq: Once | SUBCUTANEOUS | Status: AC
Start: 2024-04-22 — End: 2024-04-22
  Administered 2024-04-22: 284 mg via SUBCUTANEOUS
  Filled 2024-04-22: qty 1.5

## 2024-04-22 NOTE — Progress Notes (Signed)
 Diagnosis: Hyperlipidemia  Provider:  Mannam, Praveen MD  Procedure: Injection  Leqvio  (inclisiran), Dose: 284 mg, Site: subcutaneous, Number of injections: 1  Injection Site(s): Right arm  Post Care: Patient declined observation  Discharge: Condition: Good, Destination: Home . AVS Declined  Performed by:  Eleanor DELENA Bloch, RN

## 2024-06-11 NOTE — H&P (Signed)
 TOTAL KNEE ADMISSION H&P  Patient is being admitted for left total knee arthroplasty.  Subjective:  Chief Complaint: Left knee pain.  HPI: Joseph Foster, 78 y.o. male has a history of pain and functional disability in the left knee due to arthritis and has failed non-surgical conservative treatments for greater than 12 weeks to include NSAID's and/or analgesics, corticosteriod injections, and activity modification. Onset of symptoms was gradual, starting >10 years ago with gradually worsening course since that time. The patient noted no past surgery on the left knee.  Patient currently rates pain in the left knee at 8 out of 10 with activity. Patient has night pain, worsening of pain with activity and weight bearing, pain with passive range of motion, and crepitus. Patient has evidence of bone-on-bone arthritis in the lateral compartment and near bone-on-bone arthritis in the patellofemoral compartment. Chondrocalcinosis is also present by imaging studies. There is no active infection.  Patient Active Problem List   Diagnosis Date Noted   Influenza 12/20/2023   AKI (acute kidney injury) (HCC) 12/19/2023   Influenza A 12/19/2023   Acute on chronic respiratory failure with hypoxia (HCC) 12/19/2023   SOB (shortness of breath) 12/19/2023   Hyperlipidemia 12/26/2022   Malignant neoplasm of prostate (HCC) 01/20/2019   Laceration of finger of left hand 03/25/2018   Pain in finger of left hand 03/25/2018   Lumbar radiculopathy 12/05/2016   Degenerative disc disease, lumbar 11/04/2016   Facet arthropathy, lumbar 11/04/2016   Essential hypertension 08/14/2016   Abnormal nuclear stress test 04/29/2016   Coronary artery disease involving native coronary artery of native heart without angina pectoris    Chronic combined systolic and diastolic heart failure (HCC) 09/01/2015   OA (osteoarthritis) of knee 12/26/2014   Agitation    TIA (transient ischemic attack) 08/22/2014   Tobacco abuse  08/22/2014   Alcohol abuse 08/22/2014   Chronic pain 08/22/2014   Depression 08/22/2014   Confusion    Chronic bilateral low back pain without sciatica 05/27/2013   Pain in joint, lower leg 05/27/2013   Pain syndrome, chronic 05/27/2013   Numbness 04/19/2013   Acute medial meniscal tear 01/15/2012    Past Medical History:  Diagnosis Date   Acute medial meniscus tear of left knee    Anxiety    Aortic dilatation (HCC) 05/12/2018   Mild, noted on ECHO   Arthritis KNEES   Chronic back pain    Chronic combined systolic and diastolic heart failure (HCC)    Closed left ankle fracture    COPD (chronic obstructive pulmonary disease) (HCC)    asymptomatic, denies shortness of breath   DDD (degenerative disc disease), lumbar L4   Depression    GERD (gastroesophageal reflux disease)    Grade I diastolic dysfunction 05/12/2018   Noted on ECHO   Hyperlipidemia    Hypertension    Insomnia    LAD stenosis 03/2016   Mild, noted on heart catheterization   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   Noted on EKG   Lung nodule 2010   Left lung base nodule   Memory loss    Numbness and tingling in both hands    while riding motorcycle   Numbness and tingling of both feet    pinched nerve   Pneumonia    age 57's   Prostate cancer (HCC)    Ringing in ears    Resolved   TIA (transient ischemic attack) 2015    Past Surgical History:  Procedure Laterality Date   CARDIAC CATHETERIZATION  N/A 04/29/2016   Procedure: Left Heart Cath and Coronary Angiography;  Surgeon: Alm LELON Clay, MD;  Location: [facility] INVASIVE CV LAB;  Service: Cardiovascular;  Laterality: N/A;   COLONOSCOPY     CYSTOSCOPY N/A 03/15/2019   Procedure: CYSTOSCOPY;  Surgeon: Ottelin, Mark, MD;  Location: [facility];  Service: Urology;  Laterality: N/A;  2 seeds detected and removed by Dr. Ceil   [facility] EEG 41-60MINS  08/23/2014       KNEE ARTHROSCOPY  01/15/2012   Procedure: ARTHROSCOPY KNEE;  Surgeon: Dempsey LULLA Moan, MD;  Location: [facility];  Service: Orthopedics;  Laterality: Left;  meniscal debridement   ORIF ANKLE FRACTURE Left 12/07/2020   Procedure: OPEN REDUCTION INTERNAL FIXATION (ORIF) left lateral malleolus fracture; stress exam left foot;  Surgeon: Kit Rush, MD;  Location: [facility];  Service: Orthopedics;  Laterality: Left;    RADIOACTIVE SEED IMPLANT N/A 03/15/2019   Procedure: RADIOACTIVE SEED IMPLANT/BRACHYTHERAPY IMPLANT;  Surgeon: Ottelin, Mark, MD;  Location: [facility] [place];  Service: Urology;  Laterality: N/A;  81 seeds   RIGHT KNEE SURG.  2005   SPINAL CORD STIMULATOR IMPLANT  2009   TONSILECTOMY/ADENOIDECTOMY WITH MYRINGOTOMY     TOTAL KNEE ARTHROPLASTY Right 12/26/2014   Procedure: TOTAL RIGHT  KNEE ARTHROPLASTY;  Surgeon: Dempsey Moan, MD;  Location: [facility];  Service: Orthopedics;  Laterality: Right;   UPPER GI ENDOSCOPY     VASECTOMY      Prior to Admission medications   Medication Sig Start Date End Date Taking? Authorizing Provider  albuterol  (VENTOLIN  HFA) 108 (90 Base) MCG/ACT inhaler Inhale 2 puffs into the lungs every 4 (four) hours as needed for wheezing or shortness of breath. 12/21/23   Cheryle Page, MD  ALPRAZolam  (XANAX ) 0.5 MG tablet Take 0.5 mg by mouth at bedtime.    [provider]  carvedilol  (COREG ) 6.25 MG tablet Take 1 tablet (6.25 mg total) by mouth 2 (two) times daily. 12/04/23   Nahser, Aleene PARAS, MD  co-enzyme Q-10 30 MG capsule Take 30 mg by mouth daily. 02/09/20   [provider]  esomeprazole (NEXIUM) 20 MG capsule Take 20 mg by mouth daily as needed (reflux).    [provider]  fluticasone (FLONASE) 50 MCG/ACT nasal spray Place 1 spray into both nostrils in the morning and at bedtime.    [provider]  inclisiran (LEQVIO ) 284 MG/1.5ML SOSY injection Inject 284 mg into the skin every 6 (six) months.    [provider]  Multiple Vitamin (MULTIVITAMIN)  tablet Take 1 tablet by mouth daily.    [provider]  PARoxetine  (PAXIL ) 40 MG tablet Take 40 mg by mouth every morning.    [provider]  tamsulosin  (FLOMAX ) 0.4 MG CAPS capsule Take 0.4 mg by mouth daily.    [provider]  valsartan  (DIOVAN ) 40 MG tablet Take 1 tablet (40 mg total) by mouth daily. 03/02/24   Nahser, Aleene PARAS, MD    Allergies  Allergen Reactions   Codeine Other (See Comments)    Hyper activity    Dilaudid  [Hydromorphone  Hcl] Other (See Comments)    hallucinations   Penicillins Hives    Has patient had a PCN reaction causing immediate rash, facial/tongue/throat swelling, SOB or lightheadedness with hypotension: Yes Has patient had a PCN reaction causing severe rash involving mucus membranes or skin necrosis: No Has patient had a PCN reaction that required hospitalization No Has patient had a PCN reaction  occurring within the last 10 years: N  If all of the above answers are NO, then may proceed with Cephalosporin use.    Simvastatin  Other (See Comments)    MUSCLE ACHES   Ceclor [Cefaclor] Itching and Rash   Oxycontin  [Oxycodone  Hcl] Rash   Sulfa Antibiotics Rash    Social History   Socioeconomic History   Marital status: Married    Spouse name: Not on file   Number of children: 3   Years of education: Not on file   Highest education level: Not on file  Occupational History   Occupation: [employer]    Comment: retired  Tobacco Use   Smoking status: Former    Current packs/day: 0.00    Average packs/day: 0.3 packs/day for 20.0 years (5.0 ttl pk-yrs)    Types: Cigars, Cigarettes    Start date: 01/09/1981    Quit date: 01/09/2001    Years since quitting: 23.4   Smokeless tobacco: Former    Types: Snuff   Tobacco comments:    one cigar daily  Vaping Use   Vaping status: Former  Substance and Sexual Activity   Alcohol use: Yes    Alcohol/week: 21.0 standard drinks of alcohol    Types: 7 Cans of beer, 14 Shots of liquor per  week    Comment: 02/09/15 beer in summer, liquor in winter   Drug use: Yes    Types: Marijuana    Comment: last used 12-06-20   Sexual activity: Not on file  Other Topics Concern   Not on file  Social History Narrative   3 children but 1 passed away   Social Drivers of Health   Financial Resource Strain: Not on file  Food Insecurity: No Food Insecurity (12/19/2023)   Hunger Vital Sign    Worried About Running Out of Food in the Last Year: Never true    Ran Out of Food in the Last Year: Never true  Transportation Needs: No Transportation Needs (12/19/2023)   PRAPARE - Administrator, Civil Service (Medical): No    Lack of Transportation (Non-Medical): No  Physical Activity: Not on file  Stress: Not on file  Social Connections: Moderately Isolated (12/19/2023)   Social Connection and Isolation Panel    Frequency of Communication with Friends and Family: More than three times a week    Frequency of Social Gatherings with Friends and Family: Three times a week    Attends Religious Services: Never    Active Member of Clubs or Organizations: No    Attends Banker Meetings: Never    Marital Status: Married  Catering manager Violence: Not At Risk (12/19/2023)   Humiliation, Afraid, Rape, and Kick questionnaire    Fear of Current or Ex-Partner: No    Emotionally Abused: No    Physically Abused: No    Sexually Abused: No    Tobacco Use: Medium Risk (03/01/2024)   Patient History    Smoking Tobacco Use: Former    Smokeless Tobacco Use: Former    Passive Exposure: Not on file   Social History   Substance and Sexual Activity  Alcohol Use Yes   Alcohol/week: 21.0 standard drinks of alcohol   Types: 7 Cans of beer, 14 Shots of liquor per week   Comment: 02/09/15 beer in summer, liquor in winter    Family History  Problem Relation Age of Onset   Heart disease Father    Lung cancer Father        smoker  Leukemia Mother    Hyperlipidemia Mother    Arthritis  Mother    Hyperlipidemia Brother     Review of Systems  Constitutional:  Negative for chills and fever.  HENT:  Negative for congestion, sore throat and tinnitus.   Eyes:  Negative for double vision, photophobia and pain.  Respiratory:  Negative for cough, shortness of breath and wheezing.   Cardiovascular:  Negative for chest pain, palpitations and orthopnea.  Gastrointestinal:  Negative for heartburn, nausea and vomiting.  Genitourinary:  Negative for dysuria, frequency and urgency.  Musculoskeletal:  Positive for joint pain.  Neurological:  Negative for dizziness, weakness and headaches.    Objective:  Physical Exam: Well nourished and well developed.  General: Alert and oriented x3, cooperative and pleasant, no acute distress.  Head: normocephalic, atraumatic, neck supple.  Eyes: EOMI.  Musculoskeletal:  Left Knee: No effusion. Range of motion 5-120 degrees with crepitus noted during movement. Tenderness present medially and laterally. No instability observed.  Calves soft and nontender. Motor function intact in LE. Strength 5/5 LE bilaterally. Neuro: Distal pulses 2+. Sensation to light touch intact in LE.  Imaging Review Plain radiographs demonstrate severe degenerative joint disease of the left knee. The overall alignment is neutral. The bone quality appears to be adequate for age and reported activity level.  Assessment/Plan:  End stage arthritis, left knee   The patient history, physical examination, clinical judgment of the provider and imaging studies are consistent with end stage degenerative joint disease of the left knee and total knee arthroplasty is deemed medically necessary. The treatment options including medical management, injection therapy arthroscopy and arthroplasty were discussed at length. The risks and benefits of total knee arthroplasty were presented and reviewed. The risks due to aseptic loosening, infection, stiffness, patella tracking problems,  thromboembolic complications and other imponderables were discussed. The patient acknowledged the explanation, agreed to proceed with the plan and consent was signed. Patient is being admitted for inpatient treatment for surgery, pain control, PT, OT, prophylactic antibiotics, VTE prophylaxis, progressive ambulation and ADLs and discharge planning. The patient is planning to be discharged home.   Patient's anticipated LOS is less than 2 midnights, meeting these requirements: - Lives within 1 hour of care - Has a competent adult at home to recover with post-op recover - NO history of  - Chronic pain requiring opioids  - Diabetes  - Coronary Artery Disease  - Heart failure  - Heart attack  - Stroke  - DVT/VTE  - Cardiac arrhythmia  - Respiratory Failure/COPD  - Renal failure  - Anemia  - Advanced Liver disease  Therapy Plans: Outpatient [facility] at [facility] Disposition: Home with wife and daughter Planned DVT Prophylaxis: Aspirin  81 mg BID DME Needed: [facility] PCP: Bernardino Boone, DO (clearance received) TXA: IV Allergies: Sulfa, codeine, PCN, dilaudid , statins Metal Allergy: None Anesthesia Concerns: None BMI: 33.6 Last HgbA1c: Not diabetic Pain Regimen: Oxycodone  Pharmacy: [facility]   - Patient was instructed on what medications to stop prior to surgery. - Follow-up visit in 2 weeks with Dr. Melodi - Begin physical therapy following surgery - Pre-operative lab work as pre-surgical testing - Prescriptions will be provided in hospital at time of discharge  Roxie Mess, PA-C Orthopedic Surgery [facility] [place]

## 2024-06-14 ENCOUNTER — Ambulatory Visit: Attending: Internal Medicine

## 2024-06-14 DIAGNOSIS — Z01810 Encounter for preprocedural cardiovascular examination: Secondary | ICD-10-CM
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
## 2024-06-18 ENCOUNTER — Encounter (HOSPITAL_COMMUNITY): Payer: Self-pay

## 2024-06-18 NOTE — Patient Instructions (Addendum)
 SURGICAL WAITING ROOM VISITATION  Patients having surgery or a procedure may have no more than 2 support people in the waiting area - these visitors may rotate.    Children under the age of 49 must have an adult with them who is not the patient.  Visitors with respiratory illnesses are discouraged from visiting and should remain at home.  If the patient needs to stay at the hospital during part of their recovery, the visitor guidelines for inpatient rooms apply. Pre-op nurse will coordinate an appropriate time for 1 support person to accompany patient in pre-op.  This support person may not rotate.    Please refer to the [facility] website for the visitor guidelines for Inpatients (after your surgery is over and you are in a regular room).       Your procedure is scheduled on: 06-28-24   Report to [facility] Main Entrance    Report to admitting at     11:15  AM   Call this number if you have problems the morning of surgery [phone]   Do not eat food :After Midnight.   After Midnight you may have the following liquids until _10:45 _____ AM/ DAY OF SURGERY   then nothing by mouth  Water Non-Citrus Juices (without pulp, NO RED-Apple, White grape, White cranberry) Black Coffee (NO MILK/CREAM OR CREAMERS, sugar ok)  Clear Tea (NO MILK/CREAM OR CREAMERS, sugar ok) regular and decaf                             Plain Jell-O (NO RED)                                           Fruit ices (not with fruit pulp, NO RED)                                     Popsicles (NO RED)                                                               Sports drinks like Gatorade (NO RED)                    The day of surgery:  Drink ONE (1) Pre-Surgery Clear Ensure BY     10:45 AM the morning of surgery. Drink in one sitting. Do not sip.  This drink was given to you during your hospital  pre-op appointment visit. Nothing else to drink after completing the  Pre-Surgery Clear Ensure or G2.           If you have questions, please contact your surgeon's office.   FOLLOW  ANY ADDITIONAL PRE OP INSTRUCTIONS YOU RECEIVED FROM YOUR SURGEON'S OFFICE!!!     Oral Hygiene is also important to reduce your risk of infection.                                    Remember - BRUSH YOUR TEETH THE  MORNING OF SURGERY WITH YOUR REGULAR TOOTHPASTE  DENTURES WILL BE REMOVED PRIOR TO SURGERY PLEASE DO NOT APPLY Poly grip OR ADHESIVES!!!   Do NOT smoke after Midnight   Stop all vitamins and herbal supplements 7 days before surgery.   Take these medicines the morning of surgery with A SIP OF WATER: Tamsulosin , Paxil , Flonase, Nexium if needed , carvedilol , bring rescue inhaler    Bring CPAP mask and tubing day of surgery.                              You may not have any metal on your body including hair pins, jewelry, and body piercing             Do not wear  lotions, powders, /cologne, or deodorant                Men may shave face and neck.   Do not bring valuables to the hospital. [place] IS NOT             RESPONSIBLE   FOR VALUABLES.   Contacts, glasses, dentures or bridgework may not be worn into surgery.   Bring small overnight bag day of surgery.   DO NOT BRING YOUR HOME MEDICATIONS TO THE HOSPITAL. PHARMACY WILL DISPENSE MEDICATIONS LISTED ON YOUR MEDICATION LIST TO YOU DURING YOUR ADMISSION IN THE HOSPITAL!    Patients discharged on the day of surgery will not be allowed to drive home.  Someone NEEDS to stay with you for the first 24 hours after anesthesia.   Special Instructions: Bring a copy of your healthcare power of attorney and living will documents the day of surgery if you haven't scanned them before.              Please read over the following fact sheets you were given: IF YOU HAVE QUESTIONS ABOUT YOUR PRE-OP INSTRUCTIONS PLEASE CALL [phone].   . If you test positive for Covid or have been in contact with anyone that has tested positive in the last 10  days please notify you surgeon.      Pre-operative 5 CHG Bath Instructions   You can play a key role in reducing the risk of infection after surgery. Your skin needs to be as free of germs as possible. You can reduce the number of germs on your skin by washing with CHG (chlorhexidine  gluconate) soap before surgery. CHG is an antiseptic soap that kills germs and continues to kill germs even after washing.   DO NOT use if you have an allergy to chlorhexidine /CHG or antibacterial soaps. If your skin becomes reddened or irritated, stop using the CHG and notify one of our RNs at [phone].   Please shower with the CHG soap starting 4 days before surgery using the following schedule:     Please keep in mind the following:  DO NOT shave, including legs and underarms, starting the day of your first shower.   You may shave your face at any point before/day of surgery.  Place clean sheets on your bed the day you start using CHG soap. Use a clean washcloth (not used since being washed) for each shower. DO NOT sleep with pets once you start using the CHG.   CHG Shower Instructions:  If you choose to wash your hair and private area, wash first with your normal shampoo/soap.  After you use shampoo/soap, rinse your hair and body thoroughly to remove  shampoo/soap residue.  Turn the water OFF and apply about 3 tablespoons (45 ml) of CHG soap to a CLEAN washcloth.  Apply CHG soap ONLY FROM YOUR NECK DOWN TO YOUR TOES (washing for 3-5 minutes)  DO NOT use CHG soap on face, private areas, open wounds, or sores.  Pay special attention to the area where your surgery is being performed.  If you are having back surgery, having someone wash your back for you may be helpful. Wait 2 minutes after CHG soap is applied, then you may rinse off the CHG soap.  Pat dry with a clean towel  Put on clean clothes/pajamas   If you choose to wear lotion, please use ONLY the CHG-compatible lotions on the back of this  paper.     Additional instructions for the day of surgery: DO NOT APPLY any lotions, deodorants, cologne, or perfumes.   Put on clean/comfortable clothes.  Brush your teeth.  Ask your nurse before applying any prescription medications to the skin.      CHG Compatible Lotions   Aveeno Moisturizing lotion  Cetaphil Moisturizing Cream  Cetaphil Moisturizing Lotion  Clairol Herbal Essence Moisturizing Lotion, Dry Skin  Clairol Herbal Essence Moisturizing Lotion, Extra Dry Skin  Clairol Herbal Essence Moisturizing Lotion, Normal Skin  Curel Age Defying Therapeutic Moisturizing Lotion with Alpha Hydroxy  Curel Extreme Care Body Lotion  Curel Soothing Hands Moisturizing Hand Lotion  Curel Therapeutic Moisturizing Cream, Fragrance-Free  Curel Therapeutic Moisturizing Lotion, Fragrance-Free  Curel Therapeutic Moisturizing Lotion, Original Formula  Eucerin Daily Replenishing Lotion  Eucerin Dry Skin Therapy Plus Alpha Hydroxy Crme  Eucerin Dry Skin Therapy Plus Alpha Hydroxy Lotion  Eucerin Original Crme  Eucerin Original Lotion  Eucerin Plus Crme Eucerin Plus Lotion  Eucerin TriLipid Replenishing Lotion  Keri Anti-Bacterial Hand Lotion  Keri Deep Conditioning Original Lotion Dry Skin Formula Softly Scented  Keri Deep Conditioning Original Lotion, Fragrance Free Sensitive Skin Formula  Keri Lotion Fast Absorbing Fragrance Free Sensitive Skin Formula  Keri Lotion Fast Absorbing Softly Scented Dry Skin Formula  Keri Original Lotion  Keri Skin Renewal Lotion Keri Silky Smooth Lotion  Keri Silky Smooth Sensitive Skin Lotion  Nivea Body Creamy Conditioning Oil  Nivea Body Extra Enriched Lotion  Nivea Body Original Lotion  Nivea Body Sheer Moisturizing Lotion Nivea Crme  Nivea Skin Firming Lotion  NutraDerm 30 Skin Lotion  NutraDerm Skin Lotion  NutraDerm Therapeutic Skin Cream  NutraDerm Therapeutic Skin Lotion  ProShield Protective Hand Cream   Incentive Spirometer  An  incentive spirometer is a tool that can help keep your lungs clear and active. This tool measures how well you are filling your lungs with each breath. Taking long deep breaths may help reverse or decrease the chance of developing breathing (pulmonary) problems (especially infection) following: A long period of time when you are unable to move or be active. BEFORE THE PROCEDURE  If the spirometer includes an indicator to show your best effort, your nurse or respiratory therapist will set it to a desired goal. If possible, sit up straight or lean slightly forward. Try not to slouch. Hold the incentive spirometer in an upright position. INSTRUCTIONS FOR USE  Sit on the edge of your bed if possible, or sit up as far as you can in bed or on a chair. Hold the incentive spirometer in an upright position. Breathe out normally. Place the mouthpiece in your mouth and seal your lips tightly around it. Breathe in slowly and as deeply as possible, raising the  piston or the ball toward the top of the column. Hold your breath for 3-5 seconds or for as long as possible. Allow the piston or ball to fall to the bottom of the column. Remove the mouthpiece from your mouth and breathe out normally. Rest for a few seconds and repeat Steps 1 through 7 at least 10 times every 1-2 hours when you are awake. Take your time and take a few normal breaths between deep breaths. The spirometer may include an indicator to show your best effort. Use the indicator as a goal to work toward during each repetition. After each set of 10 deep breaths, practice coughing to be sure your lungs are clear. If you have an incision (the cut made at the time of surgery), support your incision when coughing by placing a pillow or rolled up towels firmly against it. Once you are able to get out of bed, walk around indoors and cough well. You may stop using the incentive spirometer when instructed by your caregiver.  RISKS AND COMPLICATIONS Take  your time so you do not get dizzy or light-headed. If you are in pain, you may need to take or ask for pain medication before doing incentive spirometry. It is harder to take a deep breath if you are having pain. AFTER USE Rest and breathe slowly and easily. It can be helpful to keep track of a log of your progress. Your caregiver can provide you with a simple table to help with this. If you are using the spirometer at home, follow these instructions: SEEK MEDICAL CARE IF:  You are having difficultly using the spirometer. You have trouble using the spirometer as often as instructed. Your pain medication is not giving enough relief while using the spirometer. You develop fever of 100.5 F (38.1 C) or higher. SEEK IMMEDIATE MEDICAL CARE IF:  You cough up bloody sputum that had not been present before. You develop fever of 102 F (38.9 C) or greater. You develop worsening pain at or near the incision site. MAKE SURE YOU:  Understand these instructions. Will watch your condition. Will get help right away if you are not doing well or get worse. Document Released: 01/27/2007 Document Revised: 12/09/2011 Document Reviewed: 03/30/2007 [facility] Patient Information 2014 [place], [place].   ________________________________________________________________________

## 2024-06-18 NOTE — Progress Notes (Addendum)
 PCP - Dayna Motto, DO  LOV 03-26-24 CE  clearance media dated 04-06-24 Cardiologist - Aleene Finely, MD LOV preop tele visit 06-14-24 Rosabel Mose, NP  PPM/ICD -  Device Orders -  Rep Notified -   Chest x-ray - 12-19-23 epic EKG - 12-22-23 epic Stress Test - 2017 epic ECHO - 05-12-18 epic Cardiac Cath - 2017 epic  Sleep Study -  CPAP -   Fasting Blood Sugar - n/a Checks Blood Sugar ___n/a__ times a day  Blood Thinner Instructions:n/a Aspirin  Instructions:n/a  ERAS Protcol - PRE-SURGERY Ensure    COVID vaccine -yes  Activity--Able to climb a flight of stairs with no CP or SOB   Anesthesia review: TIA 2015, CAD,COPD,CAD, aortic dilation, CHF, Spinal cord stimulator has a dead battery not working  Patient denies shortness of breath, fever, cough and chest pain at PAT appointment   All instructions explained to the patient, with a verbal understanding of the material. Patient agrees to go over the instructions while at home for a better understanding. Patient also instructed to self quarantine after being tested for COVID-19. The opportunity to ask questions was provided.

## 2024-06-23 ENCOUNTER — Encounter (HOSPITAL_COMMUNITY): Payer: Self-pay

## 2024-06-23 ENCOUNTER — Encounter (HOSPITAL_COMMUNITY)
Admission: RE | Admit: 2024-06-23 | Discharge: 2024-06-23 | Disposition: A | Discharge status: Home / Self Care | Source: Ambulatory Visit | Attending: Orthopedic Surgery | Admitting: Orthopedic Surgery

## 2024-06-23 ENCOUNTER — Other Ambulatory Visit: Payer: Self-pay

## 2024-06-23 VITALS — BP 153/96 | HR 86 | Temp 98.2°F | Resp 16 | Ht 74.0 in | Wt 261.0 lb

## 2024-06-23 DIAGNOSIS — Z8673 Personal history of transient ischemic attack (TIA), and cerebral infarction without residual deficits: Secondary | ICD-10-CM | POA: Diagnosis not present

## 2024-06-23 DIAGNOSIS — Z96651 Presence of right artificial knee joint: Secondary | ICD-10-CM | POA: Diagnosis not present

## 2024-06-23 DIAGNOSIS — Z01812 Encounter for preprocedural laboratory examination: Secondary | ICD-10-CM | POA: Insufficient documentation

## 2024-06-23 DIAGNOSIS — I10 Essential (primary) hypertension: Secondary | ICD-10-CM

## 2024-06-23 DIAGNOSIS — I110 Hypertensive heart disease with heart failure: Secondary | ICD-10-CM | POA: Diagnosis not present

## 2024-06-23 DIAGNOSIS — I2510 Atherosclerotic heart disease of native coronary artery without angina pectoris: Secondary | ICD-10-CM | POA: Insufficient documentation

## 2024-06-23 DIAGNOSIS — M1712 Unilateral primary osteoarthritis, left knee: Secondary | ICD-10-CM | POA: Insufficient documentation

## 2024-06-23 DIAGNOSIS — K219 Gastro-esophageal reflux disease without esophagitis: Secondary | ICD-10-CM | POA: Diagnosis not present

## 2024-06-23 DIAGNOSIS — J449 Chronic obstructive pulmonary disease, unspecified: Secondary | ICD-10-CM | POA: Insufficient documentation

## 2024-06-23 DIAGNOSIS — I5040 Unspecified combined systolic (congestive) and diastolic (congestive) heart failure: Secondary | ICD-10-CM | POA: Diagnosis not present

## 2024-06-23 DIAGNOSIS — Z01818 Encounter for other preprocedural examination: Secondary | ICD-10-CM | POA: Diagnosis present

## 2024-06-23 HISTORY — DX: Unspecified hearing loss, unspecified ear: H91.90

## 2024-06-23 HISTORY — DX: Myoneural disorder, unspecified: G70.9

## 2024-06-23 HISTORY — DX: Heart failure, unspecified: I50.9

## 2024-06-23 LAB — BASIC METABOLIC PANEL WITH GFR
Anion gap: 13 (ref 5–15)
BUN: 23 mg/dL (ref 8–23)
CO2: 21 mmol/L — ABNORMAL LOW (ref 22–32)
Calcium: 9.7 mg/dL (ref 8.9–10.3)
Chloride: 103 mmol/L (ref 98–111)
Creatinine, Ser: 1.03 mg/dL (ref 0.61–1.24)
GFR, Estimated: >60 mL/min (ref >60)
Glucose, Bld: 92 mg/dL (ref 70–99)
Potassium: 4.6 mmol/L (ref 3.5–5.1)
Sodium: 137 mmol/L (ref 135–145)

## 2024-06-23 LAB — CBC
HCT: 44.1 % (ref 39.0–52.0)
Hemoglobin: 14.2 g/dL (ref 13.0–17.0)
MCH: 31.3 pg (ref 26.0–34.0)
MCHC: 32.2 g/dL (ref 30.0–36.0)
MCV: 97.1 fL (ref 80.0–100.0)
Platelets: 230 K/uL (ref 150–400)
RBC: 4.54 MIL/uL (ref 4.22–5.81)
RDW: 12.6 % (ref 11.5–15.5)
WBC: 7.7 K/uL (ref 4.0–10.5)
nRBC: 0 % (ref 0.0–0.2)

## 2024-06-23 LAB — SURGICAL PCR SCREEN
MRSA, PCR: NEGATIVE
Staphylococcus aureus: NEGATIVE

## 2024-06-24 NOTE — Anesthesia Preprocedure Evaluation (Addendum)
 Anesthesia Evaluation  Patient identified by MRN, date of birth, ID band Patient awake    Reviewed: Allergy & Precautions, NPO status , Patient's Chart, lab work & pertinent test results  Airway Mallampati: II  TM Distance: >3 FB Neck ROM: Full    Dental no notable dental hx.    Pulmonary COPD, former smoker   Pulmonary exam normal        Cardiovascular hypertension, Pt. on medications and Pt. on home beta blockers + CAD and +CHF  Normal cardiovascular exam     Neuro/Psych  PSYCHIATRIC DISORDERS Anxiety Depression    TIA   GI/Hepatic negative GI ROS, Neg liver ROS,,,  Endo/Other  negative endocrine ROS    Renal/GU      Musculoskeletal  (+) Arthritis ,  SPINAL CORD STIMULATOR IMPLANT   Abdominal  (+) + obese  Peds  Hematology negative hematology ROS (+)   Anesthesia Other Findings Left knee osteoarthritis  Reproductive/Obstetrics                              Anesthesia Physical Anesthesia Plan  ASA: 3  Anesthesia Plan: Regional and Spinal   Post-op Pain Management: Regional block*   Induction:   PONV Risk Score and Plan: 1 and Ondansetron , Dexamethasone , Propofol  infusion and Treatment may vary due to age or medical condition  Airway Management Planned: Simple Face Mask  Additional Equipment:   Intra-op Plan:   Post-operative Plan:   Informed Consent: I have reviewed the patients History and Physical, chart, labs and discussed the procedure including the risks, benefits and alternatives for the proposed anesthesia with the patient or authorized representative who has indicated his/her understanding and acceptance.     Dental advisory given  Plan Discussed with: CRNA  Anesthesia Plan Comments: (PAT note 06/23/2024)         Anesthesia Quick Evaluation

## 2024-06-24 NOTE — Progress Notes (Signed)
 Anesthesia Chart Review   Case: [id number] Date/Time: 06/28/24 1330   Procedure: ARTHROPLASTY, KNEE, TOTAL (Left: Knee)   Anesthesia type: Choice   Pre-op diagnosis: Left knee osteoarthritis   Location: [facility]OM 09 / [facility]   Surgeons: Melodi Lerner, MD       DISCUSSION:78 y.o. former smoker with h/o GERD, HTN, COPD, TIA 2015, mild nonobstructive CAD by cath 2017, chronic combined CHF, left knee OA scheduled for above procedure 06/28/2024 with Dr. Lerner Melodi.   Per cardiology preoperative evaluation 06/14/2024, Joseph Foster perioperative risk of a major cardiac event is 6.6% according to the Revised Cardiac Risk Index (RCRI).  Therefore, he is at high risk for perioperative complications.   His functional capacity is good at 6.05 METs according to the Duke Activity Status Index (DASI). Recommendations: According to ACC/AHA guidelines, no further cardiovascular testing needed.  The patient may proceed to surgery at acceptable risk.   Antiplatelet and/or Anticoagulation Recommendations: None requested.   Clearance received from PCP which states pt is cleared at low risk.  VS: BP (!) 153/96   Pulse 86   Temp 36.8 C (Oral)   Resp 16   Ht 6' 2 (1.88 m)   Wt 118.4 kg   SpO2 99%   BMI 33.51 kg/m   PROVIDERS: Dayna Motto, DO is PCP   Nahser, Aleene PARAS, MD is Cardiologist  LABS: Labs reviewed: Acceptable for surgery. (all labs ordered are listed, but only abnormal results are displayed)  Labs Reviewed  BASIC METABOLIC PANEL WITH GFR - Abnormal; Notable for the following components:      Result Value   CO2 21 (*)    All other components within normal limits  SURGICAL PCR SCREEN  CBC     IMAGES:   EKG:   CV: Echo 05/12/2018 - Left ventricle: The cavity size was mildly dilated. Wall    thickness was normal. Systolic function was normal. The estimated    ejection fraction was in the range of 50% to 55%. Wall motion was    normal; there were no regional wall motion  abnormalities. Doppler    parameters are consistent with abnormal left ventricular    relaxation (grade 1 diastolic dysfunction).  - Aortic root: The aortic root was mildly dilated.   Impressions:   - Normal LV systolic function; mild LVE; mildly dilated aortic    root.   Cardiac Cath 04/29/2016  Ost LAD lesion, 20 %stenosed - with a moderate amount of calcification. Otherwise angiographically normal CAD in the right dominant system. The left ventricular systolic function is normal. Ischemic Symptoms? CCS III (Marked limitation of ordinary activity) LV end diastolic pressure is mildly elevated     No angiographic evidence of any CAD to explain the patient's abnormal Myoview . I suspect that this is likely gut attenuation. Also noted that the left ventricle function is if anything low normal.  Mildly elevated LVEDP after precath hydration. Past Medical History:  Diagnosis Date   Acute medial meniscus tear of left knee    Anxiety    Aortic dilatation 05/12/2018   Mild, noted on ECHO   Arthritis KNEES   CHF (congestive heart failure) (HCC)    Chronic back pain    Chronic combined systolic and diastolic heart failure (HCC)    Closed left ankle fracture    COPD (chronic obstructive pulmonary disease) (HCC)    asymptomatic, denies shortness of breath   DDD (degenerative disc disease), lumbar L4   GERD (gastroesophageal reflux disease)  Grade I diastolic dysfunction 05/12/2018   Noted on ECHO   HOH (hard of hearing)    BIL hearing aides   Hyperlipidemia    Hypertension    Insomnia    LAD stenosis 03/2016   Mild, noted on heart catheterization   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   Noted on EKG   Lung nodule 2010   Left lung base nodule   Memory loss    Neuromuscular disorder (HCC)    neuropathy feet   Numbness and tingling in both hands    while riding motorcycle   Numbness and tingling of both feet    pinched nerve   Pneumonia    age 71's   Prostate cancer  (HCC)    Ringing in ears    Resolved   TIA (transient ischemic attack) 2015    Past Surgical History:  Procedure Laterality Date   CARDIAC CATHETERIZATION N/A 04/29/2016   Procedure: Left Heart Cath and Coronary Angiography;  Surgeon: Alm LELON Clay, MD;  Location: [facility] INVASIVE CV LAB;  Service: Cardiovascular;  Laterality: N/A;   COLONOSCOPY     CYSTOSCOPY N/A 03/15/2019   Procedure: CYSTOSCOPY;  Surgeon: Ottelin, Mark, MD;  Location: [facility];  Service: Urology;  Laterality: N/A;  2 seeds detected and removed by Dr. Ceil   [facility] EEG 41-60MINS  08/23/2014       KNEE ARTHROSCOPY  01/15/2012   Procedure: ARTHROSCOPY KNEE;  Surgeon: Dempsey LULLA Moan, MD;  Location: [facility];  Service: Orthopedics;  Laterality: Left;  meniscal debridement   ORIF ANKLE FRACTURE Left 12/07/2020   Procedure: OPEN REDUCTION INTERNAL FIXATION (ORIF) left lateral malleolus fracture; stress exam left foot;  Surgeon: Kit Rush, MD;  Location: [facility];  Service: Orthopedics;  Laterality: Left;    RADIOACTIVE SEED IMPLANT N/A 03/15/2019   Procedure: RADIOACTIVE SEED IMPLANT/BRACHYTHERAPY IMPLANT;  Surgeon: Ottelin, Mark, MD;  Location: [facility];  Service: Urology;  Laterality: N/A;  81 seeds   RIGHT KNEE SURG.  2005   SPINAL CORD STIMULATOR IMPLANT  2009   Still in place battery is not working in years   TONSILECTOMY/ADENOIDECTOMY WITH MYRINGOTOMY     TOTAL KNEE ARTHROPLASTY Right 12/26/2014   Procedure: TOTAL RIGHT  KNEE ARTHROPLASTY;  Surgeon: Dempsey Moan, MD;  Location: [facility];  Service: Orthopedics;  Laterality: Right;   UPPER GI ENDOSCOPY     VASECTOMY      MEDICATIONS:  benzonatate (TESSALON) 200 MG capsule   albuterol  (VENTOLIN  HFA) 108 (90 Base) MCG/ACT inhaler   ALPRAZolam  (XANAX ) 0.5 MG tablet   azithromycin  (ZITHROMAX ) 250 MG tablet   carvedilol  (COREG ) 6.25 MG tablet   co-enzyme Q-10 30 MG capsule   esomeprazole  (NEXIUM) 20 MG capsule   inclisiran (LEQVIO ) 284 MG/1.5ML SOSY injection   Multiple Vitamin (MULTIVITAMIN) tablet   PARoxetine  (PAXIL ) 40 MG tablet   tamsulosin  (FLOMAX ) 0.4 MG CAPS capsule   valsartan  (DIOVAN ) 40 MG tablet   No current facility-administered medications for this encounter.    inclisiran (LEQVIO ) injection 284 mg    [facility] Ward, PA-C [facility] Pre-Surgical Testing [phone]

## 2024-06-28 ENCOUNTER — Ambulatory Visit (HOSPITAL_COMMUNITY): Payer: Self-pay | Admitting: Physician Assistant

## 2024-06-28 ENCOUNTER — Other Ambulatory Visit: Payer: Self-pay

## 2024-06-28 ENCOUNTER — Encounter (HOSPITAL_COMMUNITY)
Admission: RE | Disposition: A | Discharge status: Home / Self Care | Payer: Self-pay | Source: Home / Self Care | Attending: Orthopedic Surgery

## 2024-06-28 ENCOUNTER — Encounter (HOSPITAL_COMMUNITY): Payer: Self-pay | Admitting: Orthopedic Surgery

## 2024-06-28 ENCOUNTER — Ambulatory Visit (HOSPITAL_COMMUNITY): Admitting: Anesthesiology

## 2024-06-28 ENCOUNTER — Observation Stay (HOSPITAL_COMMUNITY)
Admission: RE | Admit: 2024-06-28 | Discharge: 2024-06-29 | Disposition: A | Discharge status: Home / Self Care | Attending: Orthopedic Surgery | Admitting: Orthopedic Surgery

## 2024-06-28 DIAGNOSIS — I10 Essential (primary) hypertension: Secondary | ICD-10-CM

## 2024-06-28 DIAGNOSIS — I2510 Atherosclerotic heart disease of native coronary artery without angina pectoris: Secondary | ICD-10-CM

## 2024-06-28 DIAGNOSIS — M1712 Unilateral primary osteoarthritis, left knee: Principal | ICD-10-CM | POA: Insufficient documentation

## 2024-06-28 DIAGNOSIS — J449 Chronic obstructive pulmonary disease, unspecified: Secondary | ICD-10-CM | POA: Diagnosis not present

## 2024-06-28 DIAGNOSIS — Z87891 Personal history of nicotine dependence: Secondary | ICD-10-CM

## 2024-06-28 DIAGNOSIS — I110 Hypertensive heart disease with heart failure: Secondary | ICD-10-CM | POA: Insufficient documentation

## 2024-06-28 DIAGNOSIS — Z8546 Personal history of malignant neoplasm of prostate: Secondary | ICD-10-CM | POA: Diagnosis not present

## 2024-06-28 DIAGNOSIS — Z8673 Personal history of transient ischemic attack (TIA), and cerebral infarction without residual deficits: Secondary | ICD-10-CM | POA: Insufficient documentation

## 2024-06-28 DIAGNOSIS — I5042 Chronic combined systolic (congestive) and diastolic (congestive) heart failure: Secondary | ICD-10-CM | POA: Insufficient documentation

## 2024-06-28 DIAGNOSIS — Z96651 Presence of right artificial knee joint: Secondary | ICD-10-CM | POA: Diagnosis not present

## 2024-06-28 DIAGNOSIS — M179 Osteoarthritis of knee, unspecified: Principal | ICD-10-CM | POA: Diagnosis present

## 2024-06-28 DIAGNOSIS — M25562 Pain in left knee: Secondary | ICD-10-CM | POA: Diagnosis present

## 2024-06-28 HISTORY — PX: TOTAL KNEE ARTHROPLASTY: SHX125

## 2024-06-28 SURGERY — ARTHROPLASTY, KNEE, TOTAL
Anesthesia: Regional | Site: Knee | Laterality: Left

## 2024-06-28 MED ORDER — ACETAMINOPHEN 325 MG PO TABS: 325.0000 mg | ORAL_TABLET | Freq: Four times a day (QID) | ORAL | Status: DC | PRN

## 2024-06-28 MED ORDER — ALPRAZOLAM 0.5 MG PO TABS
0.5000 mg | ORAL_TABLET | Freq: Every day | ORAL | Status: DC
Start: 2024-06-28 — End: 2024-06-29
  Administered 2024-06-28: 0.5 mg via ORAL
  Filled 2024-06-28: qty 1

## 2024-06-28 MED ORDER — ACETAMINOPHEN 10 MG/ML IV SOLN
1000.0000 mg | Freq: Four times a day (QID) | INTRAVENOUS | Status: DC
  Administered 2024-06-28: 1000 mg via INTRAVENOUS
  Filled 2024-06-28: qty 100

## 2024-06-28 MED ORDER — METHOCARBAMOL 1000 MG/10ML IJ SOLN: 500.0000 mg | Freq: Four times a day (QID) | INTRAMUSCULAR | Status: DC | PRN

## 2024-06-28 MED ORDER — PROPOFOL 10 MG/ML IV BOLUS
INTRAVENOUS | Status: AC
  Filled 2024-06-28: qty 20

## 2024-06-28 MED ORDER — ONDANSETRON HCL 4 MG/2ML IJ SOLN
INTRAMUSCULAR | Status: AC
  Filled 2024-06-28: qty 2

## 2024-06-28 MED ORDER — FENTANYL CITRATE PF 50 MCG/ML IJ SOSY: 25.0000 ug | PREFILLED_SYRINGE | INTRAMUSCULAR | Status: DC | PRN

## 2024-06-28 MED ORDER — METOCLOPRAMIDE HCL 5 MG PO TABS: 5.0000 mg | ORAL_TABLET | Freq: Three times a day (TID) | ORAL | Status: DC | PRN

## 2024-06-28 MED ORDER — BISACODYL 10 MG RE SUPP: 10.0000 mg | Freq: Every day | RECTAL | Status: DC | PRN

## 2024-06-28 MED ORDER — SODIUM CHLORIDE 0.9 % IV SOLN: INTRAVENOUS | Status: DC

## 2024-06-28 MED ORDER — LACTATED RINGERS IV SOLN: INTRAVENOUS | Status: DC

## 2024-06-28 MED ORDER — METOCLOPRAMIDE HCL 5 MG/ML IJ SOLN: 5.0000 mg | Freq: Three times a day (TID) | INTRAMUSCULAR | Status: DC | PRN

## 2024-06-28 MED ORDER — BUPIVACAINE-EPINEPHRINE (PF) 0.5% -1:200000 IJ SOLN
INTRAMUSCULAR | Status: DC | PRN
  Administered 2024-06-28: 30 mL via PERINEURAL

## 2024-06-28 MED ORDER — FENTANYL CITRATE PF 50 MCG/ML IJ SOSY
25.0000 ug | PREFILLED_SYRINGE | Freq: Once | INTRAMUSCULAR | Status: AC
Start: 2024-06-28 — End: 2024-06-28
  Administered 2024-06-28: 50 ug via INTRAVENOUS
  Filled 2024-06-28: qty 2

## 2024-06-28 MED ORDER — LEVOFLOXACIN IN D5W 500 MG/100ML IV SOLN
500.0000 mg | INTRAVENOUS | Status: DC
  Filled 2024-06-28: qty 100

## 2024-06-28 MED ORDER — CARVEDILOL 6.25 MG PO TABS
6.2500 mg | ORAL_TABLET | Freq: Two times a day (BID) | ORAL | Status: DC
  Administered 2024-06-28 – 2024-06-29 (×2): 6.25 mg via ORAL
  Filled 2024-06-28 (×2): qty 1

## 2024-06-28 MED ORDER — ONDANSETRON HCL 4 MG/2ML IJ SOLN: 4.0000 mg | Freq: Once | INTRAMUSCULAR | Status: DC | PRN

## 2024-06-28 MED ORDER — BUPIVACAINE LIPOSOME 1.3 % IJ SUSP: 20.0000 mL | Freq: Once | INTRAMUSCULAR | Status: DC

## 2024-06-28 MED ORDER — 0.9 % SODIUM CHLORIDE (POUR BTL) OPTIME
TOPICAL | Status: DC | PRN
  Administered 2024-06-28: 1000 mL

## 2024-06-28 MED ORDER — PAROXETINE HCL 10 MG PO TABS
40.0000 mg | ORAL_TABLET | Freq: Every day | ORAL | Status: DC
  Administered 2024-06-29: 40 mg via ORAL
  Filled 2024-06-28: qty 4

## 2024-06-28 MED ORDER — MIDAZOLAM HCL 2 MG/2ML IJ SOLN
1.0000 mg | Freq: Once | INTRAMUSCULAR | Status: DC
Start: 2024-06-28 — End: 2024-06-28
  Filled 2024-06-28: qty 2

## 2024-06-28 MED ORDER — PANTOPRAZOLE SODIUM 40 MG PO TBEC
40.0000 mg | DELAYED_RELEASE_TABLET | Freq: Every day | ORAL | Status: DC
Start: 2024-06-28 — End: 2024-06-29
  Administered 2024-06-28 – 2024-06-29 (×2): 40 mg via ORAL
  Filled 2024-06-28 (×2): qty 1

## 2024-06-28 MED ORDER — CEFAZOLIN SODIUM-DEXTROSE 2-4 GM/100ML-% IV SOLN
2.0000 g | Freq: Four times a day (QID) | INTRAVENOUS | Status: AC
  Administered 2024-06-28 – 2024-06-29 (×2): 2 g via INTRAVENOUS
  Filled 2024-06-28 (×2): qty 100

## 2024-06-28 MED ORDER — ACETAMINOPHEN 500 MG PO TABS
1000.0000 mg | ORAL_TABLET | Freq: Four times a day (QID) | ORAL | Status: DC
  Administered 2024-06-29 (×3): 1000 mg via ORAL
  Filled 2024-06-28 (×3): qty 2

## 2024-06-28 MED ORDER — DEXAMETHASONE SODIUM PHOSPHATE 10 MG/ML IJ SOLN
8.0000 mg | Freq: Once | INTRAMUSCULAR | Status: AC
Start: 2024-06-28 — End: 2024-06-28
  Administered 2024-06-28: 10 mg via INTRAVENOUS

## 2024-06-28 MED ORDER — CHLORHEXIDINE GLUCONATE 0.12 % MT SOLN
15.0000 mL | Freq: Once | OROMUCOSAL | Status: AC
  Administered 2024-06-28: 15 mL via OROMUCOSAL

## 2024-06-28 MED ORDER — DEXAMETHASONE SODIUM PHOSPHATE 10 MG/ML IJ SOLN
10.0000 mg | Freq: Once | INTRAMUSCULAR | Status: AC
  Administered 2024-06-29: 10 mg via INTRAVENOUS
  Filled 2024-06-28: qty 1

## 2024-06-28 MED ORDER — TAMSULOSIN HCL 0.4 MG PO CAPS
0.4000 mg | ORAL_CAPSULE | Freq: Every day | ORAL | Status: DC
  Administered 2024-06-29: 0.4 mg via ORAL
  Filled 2024-06-28: qty 1

## 2024-06-28 MED ORDER — TRANEXAMIC ACID-NACL 1000-0.7 MG/100ML-% IV SOLN
1000.0000 mg | INTRAVENOUS | Status: AC
  Administered 2024-06-28: 1000 mg via INTRAVENOUS
  Filled 2024-06-28: qty 100

## 2024-06-28 MED ORDER — BUPIVACAINE IN DEXTROSE 0.75-8.25 % IT SOLN
INTRATHECAL | Status: DC | PRN
  Administered 2024-06-28: 1.6 mL via INTRATHECAL

## 2024-06-28 MED ORDER — RIVAROXABAN 10 MG PO TABS
10.0000 mg | ORAL_TABLET | Freq: Every day | ORAL | Status: DC
  Administered 2024-06-29: 10 mg via ORAL
  Filled 2024-06-28: qty 1

## 2024-06-28 MED ORDER — METHOCARBAMOL 500 MG PO TABS
500.0000 mg | ORAL_TABLET | Freq: Four times a day (QID) | ORAL | Status: DC | PRN
  Administered 2024-06-28: 500 mg via ORAL
  Filled 2024-06-28: qty 1

## 2024-06-28 MED ORDER — DOCUSATE SODIUM 100 MG PO CAPS
100.0000 mg | ORAL_CAPSULE | Freq: Two times a day (BID) | ORAL | Status: DC
  Administered 2024-06-28: 100 mg via ORAL
  Filled 2024-06-28 (×2): qty 1

## 2024-06-28 MED ORDER — VASOPRESSIN 20 UNIT/ML IV SOLN
INTRAVENOUS | Status: AC
  Filled 2024-06-28: qty 1

## 2024-06-28 MED ORDER — BUPIVACAINE LIPOSOME 1.3 % IJ SUSP
INTRAMUSCULAR | Status: AC
  Filled 2024-06-28: qty 20

## 2024-06-28 MED ORDER — BUPIVACAINE LIPOSOME 1.3 % IJ SUSP
INTRAMUSCULAR | Status: DC | PRN
  Administered 2024-06-28: 20 mL

## 2024-06-28 MED ORDER — LIDOCAINE HCL (PF) 2 % IJ SOLN
INTRAMUSCULAR | Status: AC
  Filled 2024-06-28: qty 5

## 2024-06-28 MED ORDER — STERILE WATER FOR IRRIGATION IR SOLN
Status: DC | PRN
  Administered 2024-06-28: 2000 mL

## 2024-06-28 MED ORDER — OXYCODONE HCL 5 MG PO TABS
5.0000 mg | ORAL_TABLET | ORAL | Status: DC | PRN
  Administered 2024-06-28 (×2): 5 mg via ORAL
  Administered 2024-06-29 (×3): 10 mg via ORAL
  Filled 2024-06-28 (×2): qty 1
  Filled 2024-06-28 (×2): qty 2

## 2024-06-28 MED ORDER — OXYCODONE HCL 5 MG PO TABS
10.0000 mg | ORAL_TABLET | ORAL | Status: DC | PRN
  Filled 2024-06-28: qty 3

## 2024-06-28 MED ORDER — IRBESARTAN 75 MG PO TABS
75.0000 mg | ORAL_TABLET | Freq: Every day | ORAL | Status: DC
  Administered 2024-06-29: 75 mg via ORAL
  Filled 2024-06-28: qty 1

## 2024-06-28 MED ORDER — PHENYLEPHRINE HCL-NACL 20-0.9 MG/250ML-% IV SOLN
INTRAVENOUS | Status: DC | PRN
  Administered 2024-06-28: 30 ug/min via INTRAVENOUS
  Administered 2024-06-28: 120 ug via INTRAVENOUS

## 2024-06-28 MED ORDER — MENTHOL 3 MG MT LOZG: 1.0000 | LOZENGE | OROMUCOSAL | Status: DC | PRN

## 2024-06-28 MED ORDER — ONDANSETRON HCL 4 MG/2ML IJ SOLN: 4.0000 mg | Freq: Four times a day (QID) | INTRAMUSCULAR | Status: DC | PRN

## 2024-06-28 MED ORDER — POVIDONE-IODINE 10 % EX SWAB: 2.0000 | Freq: Once | CUTANEOUS | Status: DC

## 2024-06-28 MED ORDER — HYDROMORPHONE HCL 1 MG/ML IJ SOLN
0.5000 mg | INTRAMUSCULAR | Status: DC | PRN
  Administered 2024-06-29: 0.5 mg via INTRAVENOUS
  Filled 2024-06-28: qty 1

## 2024-06-28 MED ORDER — PROPOFOL 500 MG/50ML IV EMUL
INTRAVENOUS | Status: DC | PRN
  Administered 2024-06-28: 140 ug/kg/min via INTRAVENOUS
  Administered 2024-06-28 (×3): 30 mg via INTRAVENOUS

## 2024-06-28 MED ORDER — SODIUM CHLORIDE (PF) 0.9 % IJ SOLN
INTRAMUSCULAR | Status: AC
  Filled 2024-06-28: qty 50

## 2024-06-28 MED ORDER — DIPHENHYDRAMINE HCL 12.5 MG/5ML PO ELIX: 12.5000 mg | ORAL_SOLUTION | ORAL | Status: DC | PRN

## 2024-06-28 MED ORDER — SODIUM CHLORIDE (PF) 0.9 % IJ SOLN
INTRAMUSCULAR | Status: DC | PRN
  Administered 2024-06-28: 60 mL

## 2024-06-28 MED ORDER — ALBUTEROL SULFATE (2.5 MG/3ML) 0.083% IN NEBU: 3.0000 mL | INHALATION_SOLUTION | RESPIRATORY_TRACT | Status: DC | PRN

## 2024-06-28 MED ORDER — FLEET ENEMA RE ENEM: 1.0000 | ENEMA | Freq: Once | RECTAL | Status: DC | PRN

## 2024-06-28 MED ORDER — PHENOL 1.4 % MT LIQD: 1.0000 | OROMUCOSAL | Status: DC | PRN

## 2024-06-28 MED ORDER — ONDANSETRON HCL 4 MG PO TABS: 4.0000 mg | ORAL_TABLET | Freq: Four times a day (QID) | ORAL | Status: DC | PRN

## 2024-06-28 MED ORDER — DEXAMETHASONE SODIUM PHOSPHATE 10 MG/ML IJ SOLN
INTRAMUSCULAR | Status: AC
  Filled 2024-06-28: qty 1

## 2024-06-28 MED ORDER — VANCOMYCIN HCL IN DEXTROSE 1-5 GM/200ML-% IV SOLN
1000.0000 mg | INTRAVENOUS | Status: AC
  Administered 2024-06-28: 1000 mg via INTRAVENOUS
  Filled 2024-06-28: qty 200

## 2024-06-28 MED ORDER — SODIUM CHLORIDE (PF) 0.9 % IJ SOLN
INTRAMUSCULAR | Status: AC
  Filled 2024-06-28: qty 10

## 2024-06-28 MED ORDER — POLYETHYLENE GLYCOL 3350 17 G PO PACK: 17.0000 g | PACK | Freq: Every day | ORAL | Status: DC | PRN

## 2024-06-28 MED ORDER — ORAL CARE MOUTH RINSE: 15.0000 mL | Freq: Once | OROMUCOSAL | Status: AC

## 2024-06-28 MED ORDER — ONDANSETRON HCL 4 MG/2ML IJ SOLN
INTRAMUSCULAR | Status: DC | PRN
  Administered 2024-06-28: 4 mg via INTRAVENOUS

## 2024-06-28 MED ORDER — ALBUMIN HUMAN 5 % IV SOLN: INTRAVENOUS | Status: DC | PRN

## 2024-06-28 MED ORDER — SODIUM CHLORIDE 0.9 % IR SOLN
Status: DC | PRN
  Administered 2024-06-28: 1000 mL

## 2024-06-28 MED ORDER — AMISULPRIDE (ANTIEMETIC) 5 MG/2ML IV SOLN: 10.0000 mg | Freq: Once | INTRAVENOUS | Status: DC | PRN

## 2024-06-28 SURGICAL SUPPLY — 43 items
ATTUNE MED DOME PAT 41 KNEE (Knees) IMPLANT
ATTUNE PS FEM LT SZ 8 CEM KNEE (Femur) IMPLANT
ATTUNE PSRP INSR SZ8 10 KNEE (Insert) IMPLANT
BAG COUNTER SPONGE SURGICOUNT (BAG) IMPLANT
BAG ZIPLOCK 12X15 (MISCELLANEOUS) ×2 IMPLANT
BASE TIBIAL ROT PLAT SZ 8 KNEE (Knees) IMPLANT
BLADE SAG 18X100X1.27 (BLADE) ×2 IMPLANT
BLADE SAW SGTL 11.0X1.19X90.0M (BLADE) ×2 IMPLANT
BNDG ELASTIC 6INX 5YD STR LF (GAUZE/BANDAGES/DRESSINGS) ×2 IMPLANT
BOWL SMART MIX CTS (DISPOSABLE) ×2 IMPLANT
CEMENT HV SMART SET (Cement) ×4 IMPLANT
COVER SURGICAL LIGHT HANDLE (MISCELLANEOUS) ×2 IMPLANT
CUFF TRNQT CYL 34X4.125X (TOURNIQUET CUFF) ×2 IMPLANT
DERMABOND ADVANCED .7 DNX12 (GAUZE/BANDAGES/DRESSINGS) ×2 IMPLANT
DRAPE U-SHAPE 47X51 STRL (DRAPES) ×2 IMPLANT
DRSG AQUACEL AG ADV 3.5X10 (GAUZE/BANDAGES/DRESSINGS) ×2 IMPLANT
DURAPREP 26ML APPLICATOR (WOUND CARE) ×2 IMPLANT
ELECT REM PT RETURN 15FT ADLT (MISCELLANEOUS) ×2 IMPLANT
GLOVE BIO SURGEON STRL SZ 6.5 (GLOVE) IMPLANT
GLOVE BIO SURGEON STRL SZ7 (GLOVE) IMPLANT
GLOVE BIO SURGEON STRL SZ8 (GLOVE) ×2 IMPLANT
GLOVE BIOGEL PI IND STRL 7.0 (GLOVE) ×2 IMPLANT
GLOVE BIOGEL PI IND STRL 8 (GLOVE) ×2 IMPLANT
GOWN STRL REUS W/ TWL LRG LVL3 (GOWN DISPOSABLE) ×2 IMPLANT
HOLDER FOLEY CATH W/STRAP (MISCELLANEOUS) ×2 IMPLANT
IMMOBILIZER KNEE 20 THIGH 36 (SOFTGOODS) ×2 IMPLANT
KIT TURNOVER KIT A (KITS) ×2 IMPLANT
MANIFOLD NEPTUNE II (INSTRUMENTS) ×2 IMPLANT
NS IRRIG 1000ML POUR BTL (IV SOLUTION) ×2 IMPLANT
PACK TOTAL KNEE CUSTOM (KITS) ×2 IMPLANT
PADDING CAST COTTON 6X4 STRL (CAST SUPPLIES) ×4 IMPLANT
PENCIL SMOKE EVACUATOR (MISCELLANEOUS) ×2 IMPLANT
PIN STEINMAN FIXATION KNEE (PIN) IMPLANT
PROTECTOR NERVE ULNAR (MISCELLANEOUS) ×2 IMPLANT
SET HNDPC FAN SPRY TIP SCT (DISPOSABLE) ×2 IMPLANT
SUT MNCRL AB 4-0 PS2 18 (SUTURE) ×2 IMPLANT
SUT VIC AB 2-0 CT1 TAPERPNT 27 (SUTURE) ×6 IMPLANT
SUTURE STRATFX 0 PDS 27 VIOLET (SUTURE) ×2 IMPLANT
TOWEL GREEN STERILE FF (TOWEL DISPOSABLE) ×2 IMPLANT
TRAY FOLEY MTR SLVR 16FR STAT (SET/KITS/TRAYS/PACK) ×2 IMPLANT
TUBE SUCTION HIGH CAP CLEAR NV (SUCTIONS) ×2 IMPLANT
WATER STERILE IRR 1000ML POUR (IV SOLUTION) ×4 IMPLANT
WRAP KNEE MAXI GEL POST OP (GAUZE/BANDAGES/DRESSINGS) ×2 IMPLANT

## 2024-06-28 NOTE — Progress Notes (Signed)
 Orthopedic Tech Progress Note Patient Details:  Joseph Foster Jul 27, 1946 [id number] Applied CPM per order.  CPM Left Knee CPM Left Knee: On Left Knee Flexion (Degrees): 40 Left Knee Extension (Degrees): 10  Post Interventions Patient Tolerated: Well Instructions Provided: Poper ambulation with device, Care of device, Adjustment of device Ortho Devices Type of Ortho Device: CPM padding Ortho Device/Splint Location: LLE Ortho Device/Splint Interventions: Ordered, Application, Adjustment   Post Interventions Patient Tolerated: Well Instructions Provided: Poper ambulation with device, Care of device, Adjustment of device  Morna Pink 06/28/2024, 5:33 PM

## 2024-06-28 NOTE — Op Note (Signed)
 OPERATIVE REPORT-TOTAL KNEE ARTHROPLASTY   Pre-operative diagnosis- Osteoarthritis  Left knee(s)  Post-operative diagnosis- Osteoarthritis Left knee(s)  Procedure-  Left  Total Knee Arthroplasty  Surgeon- Dempsey GAILS. Pasqualina Colasurdo, MD  Assistant- roxie Mess, PA-C   Anesthesia-  Adductor canal block and spinal  EBL- 25 ml   Drains None  Tourniquet time-  Total Tourniquet Time Documented: Thigh (Left) - 43 minutes Total: Thigh (Left) - 43 minutes     Complications- None  Condition-PACU - hemodynamically stable.   Brief Clinical Note   Joseph Foster is a 78 y.o. year old male with end stage OA of his left knee with progressively worsening pain and dysfunction. He has constant pain, with activity and at rest and significant functional deficits with difficulties even with ADLs. He has had extensive non-op management including analgesics, injections of cortisone and viscosupplements, and home exercise program, but remains in significant pain with significant dysfunction. Radiographs show bone on bone arthritis medial and paellofemoral. He presents now for left Total Knee Arthroplasty.     Procedure in detail---   The patient is brought into the operating room and positioned supine on the operating table. After successful administration of  Adductor canal block and spinal,   a tourniquet is placed high on the  Left thigh(s) and the lower extremity is prepped and draped in the usual sterile fashion. Time out is performed by the operating team and then the  Left lower extremity is wrapped in Esmarch, knee flexed and the tourniquet inflated to 300 mmHg.       A midline incision is made with a ten blade through the subcutaneous tissue to the level of the extensor mechanism. A fresh blade is used to make a medial parapatellar arthrotomy. Soft tissue over the proximal medial tibia is subperiosteally elevated to the joint line with a knife and into the semimembranosus bursa with a Cobb  elevator. Soft tissue over the proximal lateral tibia is elevated with attention being paid to avoiding the patellar tendon on the tibial tubercle. The patella is everted, knee flexed 90 degrees and the ACL and PCL are removed. Findings are bone on bone medial and patellofemoral with large global osteophytes        The drill is used to create a starting hole in the distal femur and the canal is thoroughly irrigated with sterile saline to remove the fatty contents. The 5 degree Left  valgus alignment guide is placed into the femoral canal and the distal femoral cutting block is pinned to remove 10 mm off the distal femur. Resection is made with an oscillating saw.      The tibia is subluxed forward and the menisci are removed. The extramedullary alignment guide is placed referencing proximally at the medial aspect of the tibial tubercle and distally along the second metatarsal axis and tibial crest. The block is pinned to remove 2mm off the more deficient medial  side. Resection is made with an oscillating saw. Size 8is the most appropriate size for the tibia and the proximal tibia is prepared with the modular drill and keel punch for that size.      The femoral sizing guide is placed and size 8 is most appropriate. Rotation is marked off the epicondylar axis and confirmed by creating a rectangular flexion gap at 90 degrees. The size 8 cutting block is pinned in this rotation and the anterior, posterior and chamfer cuts are made with the oscillating saw. The intercondylar block is then placed and that cut  is made.      Trial size 8 tibial component, trial size 8 posterior stabilized femur and a 10  mm posterior stabilized rotating platform insert trial is placed. Full extension is achieved with excellent varus/valgus and anterior/posterior balance throughout full range of motion. The patella is everted and thickness measured to be 27  mm. Free hand resection is taken to 15 mm, a 41 template is placed, lug holes  are drilled, trial patella is placed, and it tracks normally. Osteophytes are removed off the posterior femur with the trial in place. All trials are removed and the cut bone surfaces prepared with pulsatile lavage. Cement is mixed and once ready for implantation, the size 8 tibial implant, size  8 posterior stabilized femoral component, and the size 41 patella are cemented in place and the patella is held with the clamp. The trial insert is placed and the knee held in full extension. The Exparel  (20 ml mixed with 60 ml saline) is injected into the extensor mechanism, posterior capsule, medial and lateral gutters and subcutaneous tissues.  All extruded cement is removed and once the cement is hard the permanent 10 mm posterior stabilized rotating platform insert is placed into the tibial tray.      The wound is copiously irrigated with saline solution and the extensor mechanism closed with # 0 Stratofix suture. The tourniquet is released for a total tourniquet time of 43  minutes. Flexion against gravity is 140 degrees and the patella tracks normally. Subcutaneous tissue is closed with 2.0 vicryl and subcuticular with running 4.0 Monocryl. The incision is cleaned and dried and steri-strips and a bulky sterile dressing are applied. The limb is placed into a knee immobilizer and the patient is awakened and transported to recovery in stable condition.      Please note that a surgical assistant was a medical necessity for this procedure in order to perform it in a safe and expeditious manner. Surgical assistant was necessary to retract the ligaments and vital neurovascular structures to prevent injury to them and also necessary for proper positioning of the limb to allow for anatomic placement of the prosthesis.   Dempsey ROCKFORD Shaelee Forni, MD    06/28/2024, 3:01 PM

## 2024-06-28 NOTE — Anesthesia Postprocedure Evaluation (Signed)
 Anesthesia Post Note  Patient: Joseph Foster  Procedure(s) Performed: ARTHROPLASTY, KNEE, TOTAL (Left: Knee)     Patient location during evaluation: PACU Anesthesia Type: Regional and Spinal Level of consciousness: awake Pain management: pain level controlled Vital Signs Assessment: post-procedure vital signs reviewed and stable Respiratory status: spontaneous breathing, nonlabored ventilation and respiratory function stable Cardiovascular status: blood pressure returned to baseline and stable Postop Assessment: no apparent nausea or vomiting Anesthetic complications: no   No notable events documented.  Last Vitals:  Vitals:   06/28/24 1715 06/28/24 1747  BP: (!) 139/92 136/83  Pulse: (!) 59 (!) 59  Resp: 12 18  Temp:  36.6 C  SpO2: 99% 99%    Last Pain:  Vitals:   06/28/24 1750  TempSrc:   PainSc: 1                  Zurri Rudden P Murray Durrell

## 2024-06-28 NOTE — Anesthesia Procedure Notes (Signed)
 Spinal  Patient location during procedure: OR Start time: 06/28/2024 1:40 PM End time: 06/28/2024 1:45 PM Reason for block: surgical anesthesia Staffing Performed: anesthesiologist  Anesthesiologist: Patrisha Bernardino SQUIBB, MD Performed by: Patrisha Bernardino SQUIBB, MD Authorized by: Patrisha Bernardino SQUIBB, MD   Preanesthetic Checklist Completed: patient identified, IV checked, risks and benefits discussed, surgical consent, monitors and equipment checked, pre-op evaluation and timeout performed Spinal Block Patient position: sitting Prep: DuraPrep Patient monitoring: cardiac monitor, continuous pulse ox and blood pressure Approach: midline Location: L5-S1 Injection technique: single-shot Needle Needle type: Pencan  Needle gauge: 24 G Needle length: 9 cm Assessment Sensory level: T10 Events: CSF return Additional Notes Functioning IV was confirmed and monitors were applied. Sterile prep and drape, including hand hygiene and sterile gloves were used. The patient was positioned and the spine was prepped. The skin was anesthetized with lidocaine .  Free flow of clear CSF was obtained prior to injecting local anesthetic into the CSF.  The spinal needle aspirated freely following injection.  The needle was carefully withdrawn.  The patient tolerated the procedure well.

## 2024-06-28 NOTE — Transfer of Care (Signed)
 Immediate Anesthesia Transfer of Care Note  Patient: Joseph Foster  Procedure(s) Performed: ARTHROPLASTY, KNEE, TOTAL (Left: Knee)  Patient Location: PACU  Anesthesia Type:MAC and Spinal  Level of Consciousness: awake, alert , and oriented  Airway & Oxygen Therapy: Patient Spontanous Breathing and Patient connected to nasal cannula oxygen  Post-op Assessment: Report given to RN and Post -op Vital signs reviewed and stable  Post vital signs: Reviewed and stable  Last Vitals:  Vitals Value Taken Time  BP 114/77 06/28/24 15:27  Temp    Pulse 70 06/28/24 15:30  Resp 20 06/28/24 15:30  SpO2 99 % 06/28/24 15:30  Vitals shown include unfiled device data.  Last Pain:  Vitals:   06/28/24 1527  TempSrc:   PainSc: Asleep      Patients Stated Pain Goal: 4 (06/28/24 1151)  Complications: No notable events documented.

## 2024-06-28 NOTE — Discharge Instructions (Addendum)
 Frank Aluisio, MD Total Joint Specialist [facility] [address] [place], [place] [address] [phone]  TOTAL KNEE REPLACEMENT POSTOPERATIVE DIRECTIONS    Knee Rehabilitation, Guidelines Following Surgery  Results after knee surgery are often greatly improved when you follow the exercise, range of motion and muscle strengthening exercises prescribed by your doctor. Safety measures are also important to protect the knee from further injury. If any of these exercises cause you to have increased pain or swelling in your knee joint, decrease the amount until you are comfortable again and slowly increase them. If you have problems or questions, call your caregiver or physical therapist for advice.   BLOOD CLOT PREVENTION Take a 10 mg Xarelto once a day for three weeks following surgery. Then take an 81 mg Aspirin once a day for three weeks. Then discontinue Aspirin. You may resume your vitamins/supplements once you have discontinued the Xarelto. Do not take any NSAIDs (Advil, Aleve, Ibuprofen, Meloxicam, etc.) until you have discontinued the Xarelto.   HOME CARE INSTRUCTIONS  Remove items at home which could result in a fall. This includes throw rugs or furniture in walking pathways.  ICE to the affected knee as much as tolerated. Icing helps control swelling. If the swelling is well controlled you will be more comfortable and rehab easier. Continue to use ice on the knee for pain and swelling from surgery. You may notice swelling that will progress down to the foot and ankle. This is normal after surgery. Elevate the leg when you are not up walking on it.    Continue to use the breathing machine which will help keep your temperature down. It is common for your temperature to cycle up and down following surgery, especially at night when you are not up moving around and exerting yourself. The breathing machine keeps your lungs expanded and your temperature  down. Do not place pillow under the operative knee, focus on keeping the knee straight while resting  DIET You may resume your previous home diet once you are discharged from the hospital.  DRESSING / WOUND CARE / SHOWERING Keep your bulky bandage on for 2 days. On the third post-operative day you may remove the Ace bandage and gauze. There is a waterproof adhesive bandage on your skin which will stay in place until your first follow-up appointment. Once you remove this you will not need to place another bandage You may begin showering 3 days following surgery, but do not submerge the incision under water.  ACTIVITY For the first 5 days, the key is rest and control of pain and swelling Do your home exercises twice a day starting on post-operative day 3. On the days you go to physical therapy, just do the home exercises once that day. You should rest, ice and elevate the leg for 50 minutes out of every hour. Get up and walk/stretch for 10 minutes per hour. After 5 days you can increase your activity slowly as tolerated. Walk with your walker as instructed. Use the walker until you are comfortable transitioning to a cane. Walk with the cane in the opposite hand of the operative leg. You may discontinue the cane once you are comfortable and walking steadily. Avoid periods of inactivity such as sitting longer than an hour when not asleep. This helps prevent blood clots.  You may discontinue the knee immobilizer once you are able to perform a straight leg raise while lying down. You may resume a sexual relationship in one month or   when given the OK by your doctor.  You may return to work once you are cleared by your doctor.  Do not drive a car for 6 weeks or until released by your surgeon.  Do not drive while taking narcotics.  TED HOSE STOCKINGS Wear the elastic stockings on both legs for three weeks following surgery during the day. You may remove them at night for sleeping.  WEIGHT  BEARING Weight bearing as tolerated with assist device (walker, cane, etc) as directed, use it as long as suggested by your surgeon or therapist, typically at least 4-6 weeks.  POSTOPERATIVE CONSTIPATION PROTOCOL Constipation - defined medically as fewer than three stools per week and severe constipation as less than one stool per week.  One of the most common issues patients have following surgery is constipation.  Even if you have a regular bowel pattern at home, your normal regimen is likely to be disrupted due to multiple reasons following surgery.  Combination of anesthesia, postoperative narcotics, change in appetite and fluid intake all can affect your bowels.  In order to avoid complications following surgery, here are some recommendations in order to help you during your recovery period.  Colace (docusate) - Pick up an over-the-counter form of Colace or another stool softener and take twice a day as long as you are requiring postoperative pain medications.  Take with a full glass of water daily.  If you experience loose stools or diarrhea, hold the colace until you stool forms back up. If your symptoms do not get better within 1 week or if they get worse, check with your doctor. Dulcolax (bisacodyl) - Pick up over-the-counter and take as directed by the product packaging as needed to assist with the movement of your bowels.  Take with a full glass of water.  Use this product as needed if not relieved by Colace only.  MiraLax (polyethylene glycol) - Pick up over-the-counter to have on hand. MiraLax is a solution that will increase the amount of water in your bowels to assist with bowel movements.  Take as directed and can mix with a glass of water, juice, soda, coffee, or tea. Take if you go more than two days without a movement. Do not use MiraLax more than once per day. Call your doctor if you are still constipated or irregular after using this medication for 7 days in a row.  If you continue  to have problems with postoperative constipation, please contact the office for further assistance and recommendations.  If you experience "the worst abdominal pain ever" or develop nausea or vomiting, please contact the office immediatly for further recommendations for treatment.  ITCHING If you experience itching with your medications, try taking only a single pain pill, or even half a pain pill at a time.  You can also use Benadryl over the counter for itching or also to help with sleep.   MEDICATIONS See your medication summary on the "After Visit Summary" that the nursing staff will review with you prior to discharge.  You may have some home medications which will be placed on hold until you complete the course of blood thinner medication.  It is important for you to complete the blood thinner medication as prescribed by your surgeon.  Continue your approved medications as instructed at time of discharge.  PRECAUTIONS If you experience chest pain or shortness of breath - call 911 immediately for transfer to the hospital emergency department.  If you develop a fever greater that 101 F, purulent   drainage from wound, increased redness or drainage from wound, foul odor from the wound/dressing, or calf pain - CONTACT YOUR SURGEON.                                                   FOLLOW-UP APPOINTMENTS Make sure you keep all of your appointments after your operation with your surgeon and caregivers. You should call the office at the above phone number and make an appointment for approximately two weeks after the date of your surgery or on the date instructed by your surgeon outlined in the "After Visit Summary".  RANGE OF MOTION AND STRENGTHENING EXERCISES  Rehabilitation of the knee is important following a knee injury or an operation. After just a few days of immobilization, the muscles of the thigh which control the knee become weakened and shrink (atrophy). Knee exercises are designed to build up  the tone and strength of the thigh muscles and to improve knee motion. Often times heat used for twenty to thirty minutes before working out will loosen up your tissues and help with improving the range of motion but do not use heat for the first two weeks following surgery. These exercises can be done on a training (exercise) mat, on the floor, on a table or on a bed. Use what ever works the best and is most comfortable for you Knee exercises include:  Leg Lifts - While your knee is still immobilized in a splint or cast, you can do straight leg raises. Lift the leg to 60 degrees, hold for 3 sec, and slowly lower the leg. Repeat 10-20 times 2-3 times daily. Perform this exercise against resistance later as your knee gets better.  Quad and Hamstring Sets - Tighten up the muscle on the front of the thigh (Quad) and hold for 5-10 sec. Repeat this 10-20 times hourly. Hamstring sets are done by pushing the foot backward against an object and holding for 5-10 sec. Repeat as with quad sets.  Leg Slides: Lying on your back, slowly slide your foot toward your buttocks, bending your knee up off the floor (only go as far as is comfortable). Then slowly slide your foot back down until your leg is flat on the floor again. Angel Wings: Lying on your back spread your legs to the side as far apart as you can without causing discomfort.  A rehabilitation program following serious knee injuries can speed recovery and prevent re-injury in the future due to weakened muscles. Contact your doctor or a physical therapist for more information on knee rehabilitation.   POST-OPERATIVE OPIOID TAPER INSTRUCTIONS: It is important to wean off of your opioid medication as soon as possible. If you do not need pain medication after your surgery it is ok to stop day one. Opioids include: Codeine, Hydrocodone(Norco, Vicodin), Oxycodone(Percocet, oxycontin) and hydromorphone amongst others.  Long term and even short term use of opiods can  cause: Increased pain response Dependence Constipation Depression Respiratory depression And more.  Withdrawal symptoms can include Flu like symptoms Nausea, vomiting And more Techniques to manage these symptoms Hydrate well Eat regular healthy meals Stay active Use relaxation techniques(deep breathing, meditating, yoga) Do Not substitute Alcohol to help with tapering If you have been on opioids for less than two weeks and do not have pain than it is ok to stop all together.  Plan to   wean off of opioids This plan should start within one week post op of your joint replacement. Maintain the same interval or time between taking each dose and first decrease the dose.  Cut the total daily intake of opioids by one tablet each day Next start to increase the time between doses. The last dose that should be eliminated is the evening dose.   IF YOU ARE TRANSFERRED TO A SKILLED REHAB FACILITY If the patient is transferred to a skilled rehab facility following release from the hospital, a list of the current medications will be sent to the facility for the patient to continue.  When discharged from the skilled rehab facility, please have the facility set up the patient's Home Health Physical Therapy prior to being released. Also, the skilled facility will be responsible for providing the patient with their medications at time of release from the facility to include their pain medication, the muscle relaxants, and their blood thinner medication. If the patient is still at the rehab facility at time of the two week follow up appointment, the skilled rehab facility will also need to assist the patient in arranging follow up appointment in our office and any transportation needs.  MAKE SURE YOU:  Understand these instructions.  Get help right away if you are not doing well or get worse.   DENTAL ANTIBIOTICS:  In most cases prophylactic antibiotics for Dental procdeures after total joint surgery are  not necessary.  Exceptions are as follows:  1. History of prior total joint infection  2. Severely immunocompromised (Organ Transplant, cancer chemotherapy, Rheumatoid biologic meds such as Humera)  3. Poorly controlled diabetes (A1C &gt; 8.0, blood glucose over 200)  If you have one of these conditions, contact your surgeon for an antibiotic prescription, prior to your dental procedure.    Pick up stool softner and laxative for home use following surgery while on pain medications. Do not submerge incision under water. Please use good hand washing techniques while changing dressing each day. May shower starting three days after surgery. Please use a clean towel to pat the incision dry following showers. Continue to use ice for pain and swelling after surgery. Do not use any lotions or creams on the incision until instructed by your surgeon.  Information on my medicine - XARELTO (Rivaroxaban)  Why was Xarelto prescribed for you? Xarelto was prescribed for you to reduce the risk of blood clots forming after orthopedic surgery. The medical term for these abnormal blood clots is venous thromboembolism (VTE).  What do you need to know about xarelto ? Take your Xarelto ONCE DAILY at the same time every day. You may take it either with or without food.  If you have difficulty swallowing the tablet whole, you may crush it and mix in applesauce just prior to taking your dose.  Take Xarelto exactly as prescribed by your doctor and DO NOT stop taking Xarelto without talking to the doctor who prescribed the medication.  Stopping without other VTE prevention medication to take the place of Xarelto may increase your risk of developing a clot.  After discharge, you should have regular check-up appointments with your healthcare provider that is prescribing your Xarelto.    What do you do if you miss a dose? If you miss a dose, take it as soon as you remember on the same day then  continue your regularly scheduled once daily regimen the next day. Do not take two doses of Xarelto on the same day.   Important   Safety Information A possible side effect of Xarelto is bleeding. You should call your healthcare provider right away if you experience any of the following: Bleeding from an injury or your nose that does not stop. Unusual colored urine (red or dark brown) or unusual colored stools (red or black). Unusual bruising for unknown reasons. A serious fall or if you hit your head (even if there is no bleeding).  Some medicines may interact with Xarelto and might increase your risk of bleeding while on Xarelto. To help avoid this, consult your healthcare provider or pharmacist prior to using any new prescription or non-prescription medications, including herbals, vitamins, non-steroidal anti-inflammatory drugs (NSAIDs) and supplements.  This website has more information on Xarelto: www.xarelto.com.    

## 2024-06-28 NOTE — Interval H&P Note (Signed)
 History and Physical Interval Note:  06/28/2024 11:48 AM  Joseph Foster  has presented today for surgery, with the diagnosis of Left knee osteoarthritis.  The various methods of treatment have been discussed with the patient and family. After consideration of risks, benefits and other options for treatment, the patient has consented to  Procedure(s): ARTHROPLASTY, KNEE, TOTAL (Left) as a surgical intervention.  The patient's history has been reviewed, patient examined, no change in status, stable for surgery.  I have reviewed the patient's chart and labs.  Questions were answered to the patient's satisfaction.     Dempsey Lorice Lafave

## 2024-06-28 NOTE — Anesthesia Procedure Notes (Signed)
 Anesthesia Regional Block: Adductor canal block   Pre-Anesthetic Checklist: , timeout performed,  Correct Patient, Correct Site, Correct Laterality,  Correct Procedure,, site marked,  Risks and benefits discussed,  Surgical consent,  Pre-op evaluation,  At surgeon's request and post-op pain management  Laterality: Left  Prep: chloraprep       Needles:  Injection technique: Single-shot  Needle Type: Echogenic Stimulator Needle     Needle Length: 10cm  Needle Gauge: 20     Additional Needles:   Procedures:,,,, ultrasound used (permanent image in chart),,    Narrative:  Start time: 06/28/2024 1:00 PM End time: 06/28/2024 1:10 PM Injection made incrementally with aspirations every 5 mL.  Performed by: Personally  Anesthesiologist: Patrisha Bernardino SQUIBB, MD  Additional Notes: Functioning IV was confirmed and monitors were applied. A time-out was performed. Hand hygiene and sterile gloves were used. The thigh was placed in a frog-leg position and prepped in a sterile fashion. A 20ga Bbraun echogenic stimulator needle was placed using ultrasound guidance.  Negative aspiration and negative test dose prior to incremental administration of local anesthetic. The patient tolerated the procedure well.

## 2024-06-29 ENCOUNTER — Encounter: Payer: Self-pay | Admitting: Cardiovascular Disease

## 2024-06-29 ENCOUNTER — Encounter (HOSPITAL_COMMUNITY): Payer: Self-pay | Admitting: Orthopedic Surgery

## 2024-06-29 ENCOUNTER — Other Ambulatory Visit (HOSPITAL_COMMUNITY): Payer: Self-pay

## 2024-06-29 ENCOUNTER — Other Ambulatory Visit: Payer: Self-pay

## 2024-06-29 DIAGNOSIS — M1712 Unilateral primary osteoarthritis, left knee: Secondary | ICD-10-CM | POA: Diagnosis not present

## 2024-06-29 LAB — BASIC METABOLIC PANEL WITH GFR
Anion gap: 14 (ref 5–15)
BUN: 16 mg/dL (ref 8–23)
CO2: 22 mmol/L (ref 22–32)
Calcium: 9.1 mg/dL (ref 8.9–10.3)
Chloride: 100 mmol/L (ref 98–111)
Creatinine, Ser: 0.98 mg/dL (ref 0.61–1.24)
GFR, Estimated: >60 mL/min (ref >60)
Glucose, Bld: 162 mg/dL — ABNORMAL HIGH (ref 70–99)
Potassium: 4.3 mmol/L (ref 3.5–5.1)
Sodium: 136 mmol/L (ref 135–145)

## 2024-06-29 LAB — CBC
HCT: 40 % (ref 39.0–52.0)
Hemoglobin: 13.4 g/dL (ref 13.0–17.0)
MCH: 32.2 pg (ref 26.0–34.0)
MCHC: 33.5 g/dL (ref 30.0–36.0)
MCV: 96.2 fL (ref 80.0–100.0)
Platelets: 204 K/uL (ref 150–400)
RBC: 4.16 MIL/uL — ABNORMAL LOW (ref 4.22–5.81)
RDW: 12.4 % (ref 11.5–15.5)
WBC: 17.3 K/uL — ABNORMAL HIGH (ref 4.0–10.5)
nRBC: 0 % (ref 0.0–0.2)

## 2024-06-29 MED ORDER — OXYCODONE HCL 5 MG PO TABS
5.0000 mg | ORAL_TABLET | Freq: Four times a day (QID) | ORAL | 0 refills | Status: AC | PRN
  Filled 2024-06-29: qty 42, 7d supply, fill #0

## 2024-06-29 MED ORDER — METHOCARBAMOL 500 MG PO TABS
500.0000 mg | ORAL_TABLET | Freq: Four times a day (QID) | ORAL | 0 refills | Status: AC | PRN
  Filled 2024-06-29: qty 40, 10d supply, fill #0

## 2024-06-29 MED ORDER — ONDANSETRON HCL 4 MG PO TABS
4.0000 mg | ORAL_TABLET | Freq: Four times a day (QID) | ORAL | 0 refills | Status: AC | PRN
  Filled 2024-06-29: qty 20, 5d supply, fill #0

## 2024-06-29 MED ORDER — RIVAROXABAN 10 MG PO TABS
10.0000 mg | ORAL_TABLET | Freq: Every day | ORAL | 0 refills | Status: AC
  Filled 2024-06-29: qty 21, 21d supply, fill #0

## 2024-06-29 NOTE — Progress Notes (Signed)
 Discharge meds in a secure bag delivered to room by this RN

## 2024-06-29 NOTE — Progress Notes (Signed)
 Physical Therapy Treatment Patient Details Name: Joseph Foster MRN: [id number] DOB: May 02, 1946 Today's Date: 06/29/2024   History of Present Illness Pt is 78 yo male s/p L TKA on 06/28/24.  Pt with hx including but not limited to R TKA, SOB, prostate CA, lumbar radiculopathy, neuropathy, CAD, OA, TIA, low back pain, ddd, anxiety and depression, COPD    PT Comments  Pt is POD # 1 and is progressing well. Pt with improved pain control and tremors this afternoon.  He had good quad activation.  Pt with flexion to ~90 degrees but does have some difficulty with terminal knee ext - educated on importance of resting with leg straight (taking breaks if needed) and performing quad sets.  Pt with  improved gait stability and distance and able to perform stairs similar to home set up. Pt demonstrates safe gait & transfers in order to return home from PT perspective once discharged by MD.  While in hospital, will continue to benefit from PT for skilled therapy to advance mobility and exercises.       If plan is discharge home, recommend the following: A little help with walking and/or transfers;A little help with bathing/dressing/bathroom;Assistance with cooking/housework;Help with stairs or ramp for entrance   Can travel by private vehicle        Equipment Recommendations  Rolling walker (2 wheels)    Recommendations for Other Services       Precautions / Restrictions Precautions Precautions: Fall;Knee Required Braces or Orthoses: Knee Immobilizer - Left Knee Immobilizer - Left: Discontinue once straight leg raise with < 10 degree lag Restrictions Weight Bearing Restrictions Per Provider Order: Yes LLE Weight Bearing Per Provider Order: Weight bearing as tolerated     Mobility  Bed Mobility Overal bed mobility: Needs Assistance Bed Mobility: Supine to Sit, Sit to Supine     Supine to sit: Supervision Sit to supine: Supervision   General bed mobility comments: self assit L LE with belt     Transfers Overall transfer level: Needs assistance Equipment used: Rolling walker (2 wheels) Transfers: Sit to/from Stand Sit to Stand: Supervision           General transfer comment: Good recall of hand placement; stood from low bed; cues for controlled sit    Ambulation/Gait Ambulation/Gait assistance: Supervision, Contact guard assist Gait Distance (Feet): 100 Feet Assistive device: Rolling walker (2 wheels) Gait Pattern/deviations: Step-to pattern, Decreased stride length Gait velocity: decreased     General Gait Details: Good recall of RW proximity, improved steadiness and able to progress to supervision   Stairs Stairs: Yes Stairs assistance: Contact guard assist Stair Management: Two rails, Step to pattern, Forwards Number of Stairs: 5 General stair comments: pt aware of correct sequencing; tolerated well   Wheelchair Mobility     Tilt Bed    Modified Rankin (Stroke Patients Only)       Balance Overall balance assessment: Needs assistance Sitting-balance support: No upper extremity supported Sitting balance-Leahy Scale: Good     Standing balance support: Bilateral upper extremity supported, No upper extremity supported Standing balance-Leahy Scale: Fair Standing balance comment: Steady with RW to ambulate; could static stand for ADLs wtihout UE support                            Communication Communication Factors Affecting Communication: Hearing impaired  Cognition Arousal: Alert Behavior During Therapy: WFL for tasks assessed/performed   PT - Cognitive impairments: No apparent impairments  Cueing    Exercises Total Joint Exercises Ankle Circles/Pumps: AROM, Both, 10 reps, Supine Quad Sets: AROM, Both, 10 reps, Supine Heel Slides: Left, 5 reps, Supine, AAROM Hip ABduction/ADduction: AAROM, Supine, Left, 5 reps Long Arc Quad: AAROM, Left, 5 reps, Seated Knee Flexion: AAROM, Left, 10  reps, Seated Goniometric ROM: L knee 8 to 90 degrees    General Comments General comments (skin integrity, edema, etc.): Improved tremors this afternoon Educated on safe ice use, no pivots, car transfers, resting with leg straight, and TED hose during day. Also, encouraged walking every 1-2 hours during day. Educated on HEP with focus on mobility the first weeks. Discussed doing exercises within pain control and if pain increasing could decreased ROM, reps, and stop exercises as needed. Encouraged to perform quad sets and ankle pumps frequently for blood flow and to promote full knee extension.      Pertinent Vitals/Pain Pain Assessment Pain Assessment: 0-10 Pain Score: 4  Pain Location: L knee Pain Descriptors / Indicators: Discomfort Pain Intervention(s): Limited activity within patient's tolerance, Monitored during session, Premedicated before session, Repositioned, Ice applied    Home Living Family/patient expects to be discharged to:: Private residence Living Arrangements: Spouse/significant other Available Help at Discharge: Family;Available 24 hours/day (wife is there; and daughter staying a week) Type of Home: House Home Access: Stairs to enter   [employer] of Steps: 2 in garage no rails; 3 in front have bil rails and can reach both   Home Layout: Two level;Able to live on main level with bedroom/bathroom Home Equipment: [place] - single point      Prior Function            PT Goals (current goals can now be found in the care plan section) Acute Rehab PT Goals Patient Stated Goal: return home PT Goal Formulation: With patient Time For Goal Achievement: 07/13/24 Potential to Achieve Goals: Good Progress towards PT goals: Progressing toward goals    Frequency    7X/week      PT Plan      Co-evaluation              AM-PAC PT 6 Clicks Mobility   Outcome Measure  Help needed turning from your back to your side while in a flat bed without  using bedrails?: A Little Help needed moving from lying on your back to sitting on the side of a flat bed without using bedrails?: A Little Help needed moving to and from a bed to a chair (including a wheelchair)?: A Little Help needed standing up from a chair using your arms (e.g., wheelchair or bedside chair)?: A Little Help needed to walk in hospital room?: A Little Help needed climbing 3-5 steps with a railing? : A Little 6 Click Score: 18    End of Session Equipment Utilized During Treatment: Gait belt Activity Tolerance: Patient tolerated treatment well Patient left: with call bell/phone within reach;in bed;with family/visitor present Nurse Communication: Mobility status PT Visit Diagnosis: Other abnormalities of gait and mobility (R26.89);Muscle weakness (generalized) (M62.81)     Time: 8656-8590 PT Time Calculation (min) (ACUTE ONLY): 26 min  Charges:    $Gait Training: 8-22 mins $Therapeutic Exercise: 8-22 mins PT General Charges $$ ACUTE PT VISIT: 1 Visit                     Benjiman, PT Acute Rehab [facility] [phone]    Benjiman VEAR Mulberry 06/29/2024, 2:21 PM
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
## 2024-06-29 NOTE — TOC Transition Note (Signed)
 Transition of Care [facility]) - Discharge Note   Patient Details  Name: NEVYN BOSSMAN MRN: [id number] Date of Birth: 1946-06-08  [facility] CM/SW Contact:  NORMAN ASPEN, LCSW Phone Number: 06/29/2024, 10:03 AM   Clinical Narrative:     Have met and pt and confirming he has received RW to room via [employer].  OPPT already arranged with [facility] [place]).  No further IP CM needs.  Final next level of care: OP Rehab Barriers to Discharge: No Barriers Identified   Patient Goals and CMS Choice Patient states their goals for this hospitalization and ongoing recovery are:: return home          Discharge Placement                       Discharge Plan and Services Additional resources added to the After Visit Summary for                  DME Arranged: Walker rolling DME Agency: [employer]                  Social Drivers of Health (SDOH) Interventions SDOH Screenings   Food Insecurity: No Food Insecurity (06/28/2024)  Housing: Low Risk  (06/28/2024)  Transportation Needs: No Transportation Needs (06/28/2024)  Utilities: Not At Risk (06/28/2024)  Social Connections: Moderately Isolated (06/28/2024)  Tobacco Use: High Risk (06/28/2024)     Readmission Risk Interventions     No data to display

## 2024-06-29 NOTE — Progress Notes (Signed)
 Pt ready for discharge. IV removed. Meds delivered. AVS printed and reviewed. Reviewed medications, wound care instructions, and follow up appointments. All questions asked and answered. Pt verbalized understanding. Pt was transported off unit in wheelchair with staff.

## 2024-06-29 NOTE — Evaluation (Signed)
 Physical Therapy Evaluation Patient Details Name: Joseph Foster MRN: [id number] DOB: [birth year] Today's Date: 06/29/2024  History of Present Illness  Pt is 78 yo male s/p L TKA on 06/28/24.  Pt with hx including but not limited to R TKA, SOB, prostate CA, lumbar radiculopathy, neuropathy, CAD, OA, TIA, low back pain, ddd, anxiety and depression, COPD  Clinical Impression  Pt is s/p TKA resulting in the deficits listed below (see PT Problem List). At baseline, pt is independent.  He lives with his wife but daughter also staying for a week to assist.  Pt has RW that has been delivered to room and fit to his height.  Today, pt with fair pain control and good quad activation.  He was able to ambulate 81' but did have some mild unsteadiness and SOB (O2 sats were 96% on RA).  Will benefit from further PT to progress prior to d/c.  Pt will benefit from acute skilled PT to increase their independence and safety with mobility to allow discharge.          If plan is discharge home, recommend the following: A little help with walking and/or transfers;A little help with bathing/dressing/bathroom;Assistance with cooking/housework;Help with stairs or ramp for entrance   Can travel by private vehicle        Equipment Recommendations Rolling walker (2 wheels)  Recommendations for Other Services       Functional Status Assessment Patient has had a recent decline in their functional status and demonstrates the ability to make significant improvements in function in a reasonable and predictable amount of time.     Precautions / Restrictions Precautions Precautions: Fall;Knee Required Braces or Orthoses: Knee Immobilizer - Left Knee Immobilizer - Left: Discontinue once straight leg raise with < 10 degree lag Restrictions Weight Bearing Restrictions Per Provider Order: Yes LLE Weight Bearing Per Provider Order: Weight bearing as tolerated      Mobility  Bed Mobility Overal bed mobility: Needs  Assistance Bed Mobility: Supine to Sit     Supine to sit: Contact guard     General bed mobility comments: self assit L LE with belt    Transfers Overall transfer level: Needs assistance Equipment used: Rolling walker (2 wheels) Transfers: Sit to/from Stand Sit to Stand: Contact guard assist, From elevated surface           General transfer comment: cues for hand placement and L LE management    Ambulation/Gait Ambulation/Gait assistance: Contact guard assist, Min assist Gait Distance (Feet): 80 Feet Assistive device: Rolling walker (2 wheels) Gait Pattern/deviations: Step-to pattern, Decreased stride length, Trunk flexed Gait velocity: decreased     General Gait Details: Cues for RW proximity and posture, also cues for smaller step length for pain control, had one LOB posteriorly recovered with a backward step and light min A  Stairs            Wheelchair Mobility     Tilt Bed    Modified Rankin (Stroke Patients Only)       Balance Overall balance assessment: Needs assistance Sitting-balance support: No upper extremity supported Sitting balance-Leahy Scale: Good     Standing balance support: Bilateral upper extremity supported, Reliant on assistive device for balance Standing balance-Leahy Scale: Poor                               Pertinent Vitals/Pain Pain Assessment Pain Assessment: 0-10 Pain Score: 5  (2/10 pre; 5/10  post) Pain Location: L knee Pain Descriptors / Indicators: Discomfort Pain Intervention(s): Limited activity within patient's tolerance, Monitored during session, Premedicated before session, Repositioned, Ice applied    Home Living Family/patient expects to be discharged to:: Private residence Living Arrangements: Spouse/significant other Available Help at Discharge: Family;Available 24 hours/day (wife is there; and daughter staying a week) Type of Home: House Home Access: Stairs to enter   [employer] of Steps: 2 in garage no rails; 3 in front have bil rails and can reach both   Home Layout: Two level;Able to live on main level with bedroom/bathroom Home Equipment: [place] - single point      Prior Function Prior Level of Function : Driving;Independent/Modified Independent             Mobility Comments: could ambulate in community without AD ADLs Comments: independent adls and ialds     Extremity/Trunk Assessment   Upper Extremity Assessment Upper Extremity Assessment: Overall WFL for tasks assessed    Lower Extremity Assessment Lower Extremity Assessment: LLE deficits/detail;RLE deficits/detail RLE Deficits / Details: ROM WFL; MMT 5/5 LLE Deficits / Details: Expected post op changes; ROM 8 to 85 degrees; MMT: ankle 5/5, knee and hip 3/5 not further tested - tending to keep L hip ER and knee slightly flexed.  Educated on importance of resting with leg straight, used towel roll to block L hip ER    Cervical / Trunk Assessment Cervical / Trunk Assessment: Normal  Communication   Communication Factors Affecting Communication: Hearing impaired    Cognition Arousal: Alert Behavior During Therapy: WFL for tasks assessed/performed   PT - Cognitive impairments: No apparent impairments                                 Cueing       General Comments General comments (skin integrity, edema, etc.): Pt with tremors throughout - reports he has at baseline but that they are exacerbated at this time    Exercises Total Joint Exercises Ankle Circles/Pumps: AROM, Both, 10 reps, Supine Quad Sets: AROM, Both, 10 reps, Supine Long Arc Quad: AAROM, Left, 5 reps, Seated Knee Flexion: AAROM, Left, 10 reps, Seated   Assessment/Plan    PT Assessment Patient needs continued PT services  PT Problem List Decreased strength;Decreased mobility;Decreased range of motion;Decreased activity tolerance;Decreased balance;Decreased knowledge of use of DME;Pain       PT  Treatment Interventions Therapeutic exercise;DME instruction;Gait training;Stair training;Functional mobility training;Therapeutic activities;Patient/family education;Modalities;Balance training    PT Goals (Current goals can be found in the Care Plan section)  Acute Rehab PT Goals Patient Stated Goal: return home PT Goal Formulation: With patient Time For Goal Achievement: 07/13/24 Potential to Achieve Goals: Good    Frequency 7X/week     Co-evaluation               AM-PAC PT 6 Clicks Mobility  Outcome Measure Help needed turning from your back to your side while in a flat bed without using bedrails?: A Little Help needed moving from lying on your back to sitting on the side of a flat bed without using bedrails?: A Little Help needed moving to and from a bed to a chair (including a wheelchair)?: A Little Help needed standing up from a chair using your arms (e.g., wheelchair or bedside chair)?: A Little Help needed to walk in hospital room?: A Little Help needed climbing 3-5 steps with a railing? : A Lot  6 Click Score: 17    End of Session Equipment Utilized During Treatment: Gait belt Activity Tolerance: Patient tolerated treatment well Patient left: in chair;with call bell/phone within reach;with chair alarm set Nurse Communication: Mobility status PT Visit Diagnosis: Other abnormalities of gait and mobility (R26.89);Muscle weakness (generalized) (M62.81)    Time: 8879-8856 PT Time Calculation (min) (ACUTE ONLY): 23 min   Charges:   PT Evaluation $PT Eval Low Complexity: 1 Low PT Treatments $Gait Training: 8-22 mins PT General Charges $$ ACUTE PT VISIT: 1 Visit         Joseph Foster, PT Acute Rehab [facility] Re[facility] [phone]   Joseph Foster [place] 06/29/2024, 12:01 PM
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
## 2024-07-02 ENCOUNTER — Ambulatory Visit

## 2024-07-05 ENCOUNTER — Ambulatory Visit

## 2024-07-09 ENCOUNTER — Ambulatory Visit

## 2024-07-12 ENCOUNTER — Ambulatory Visit: Attending: Student

## 2024-07-12 DIAGNOSIS — M25662 Stiffness of left knee, not elsewhere classified: Secondary | ICD-10-CM | POA: Insufficient documentation

## 2024-07-12 DIAGNOSIS — M25562 Pain in left knee: Secondary | ICD-10-CM | POA: Diagnosis present

## 2024-07-12 DIAGNOSIS — Z96652 Presence of left artificial knee joint: Secondary | ICD-10-CM | POA: Insufficient documentation

## 2024-07-12 NOTE — Therapy (Signed)
 OUTPATIENT PHYSICAL THERAPY LOWER EXTREMITY EVALUATION   Patient Name: Joseph Foster MRN: [id number] DOB:[birth year], 78 y.o., male Today's Date: 07/12/2024  END OF SESSION:  PT End of Session - 07/12/24 1656     Visit Number 1    Date for Recertification  10/04/24    Authorization Type Medicare    PT Start Time 1657    PT Stop Time 1735    PT Time Calculation (min) 38 min    Activity Tolerance Patient limited by pain          Past Medical History:  Diagnosis Date   Acute medial meniscus tear of left knee    Anxiety    Aortic dilatation 05/12/2018   Mild, noted on ECHO   Arthritis KNEES   CHF (congestive heart failure) (HCC)    Chronic back pain    Chronic combined systolic and diastolic heart failure (HCC)    Closed left ankle fracture    COPD (chronic obstructive pulmonary disease) (HCC)    asymptomatic, denies shortness of breath   DDD (degenerative disc disease), lumbar L4   GERD (gastroesophageal reflux disease)    Grade I diastolic dysfunction 05/12/2018   Noted on ECHO   HOH (hard of hearing)    BIL hearing aides   Hyperlipidemia    Hypertension    Insomnia    LAD stenosis 03/2016   Mild, noted on heart catheterization   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   Noted on EKG   Lung nodule 2010   Left lung base nodule   Memory loss    Neuromuscular disorder (HCC)    neuropathy feet   Numbness and tingling in both hands    while riding motorcycle   Numbness and tingling of both feet    pinched nerve   Pneumonia    age 85's   Prostate cancer (HCC)    Ringing in ears    Resolved   TIA (transient ischemic attack) 2015   Past Surgical History:  Procedure Laterality Date   CARDIAC CATHETERIZATION N/A 04/29/2016   Procedure: Left Heart Cath and Coronary Angiography;  Surgeon: Alm LELON Clay, MD;  Location: [facility] INVASIVE CV LAB;  Service: Cardiovascular;  Laterality: N/A;   COLONOSCOPY     CYSTOSCOPY N/A 03/15/2019   Procedure: CYSTOSCOPY;   Surgeon: Ottelin, Mark, MD;  Location: [facility];  Service: Urology;  Laterality: N/A;  2 seeds detected and removed by Dr. Ceil   [facility] EEG 41-60MINS  08/23/2014       KNEE ARTHROSCOPY  01/15/2012   Procedure: ARTHROSCOPY KNEE;  Surgeon: Dempsey LULLA Moan, MD;  Location: [facility];  Service: Orthopedics;  Laterality: Left;  meniscal debridement   ORIF ANKLE FRACTURE Left 12/07/2020   Procedure: OPEN REDUCTION INTERNAL FIXATION (ORIF) left lateral malleolus fracture; stress exam left foot;  Surgeon: Kit Norleen, MD;  Location: [facility];  Service: Orthopedics;  Laterality: Left;    RADIOACTIVE SEED IMPLANT N/A 03/15/2019   Procedure: RADIOACTIVE SEED IMPLANT/BRACHYTHERAPY IMPLANT;  Surgeon: Ottelin, Mark, MD;  Location: [facility];  Service: Urology;  Laterality: N/A;  81 seeds   RIGHT KNEE SURG.  2005   SPINAL CORD STIMULATOR IMPLANT  2009   Still in place battery is not working in years   TONSILECTOMY/ADENOIDECTOMY WITH MYRINGOTOMY     TOTAL KNEE ARTHROPLASTY Right 12/26/2014   Procedure: TOTAL RIGHT  KNEE ARTHROPLASTY;  Surgeon: Dempsey Moan, MD;  Location: [facility];  Service: Orthopedics;  Laterality: Right;   TOTAL KNEE ARTHROPLASTY Left 06/28/2024   Procedure: ARTHROPLASTY, KNEE, TOTAL;  Surgeon: Melodi Lerner, MD;  Location: [facility];  Service: Orthopedics;  Laterality: Left;   UPPER GI ENDOSCOPY     VASECTOMY     Patient Active Problem List   Diagnosis Date Noted   Primary osteoarthritis of left knee 06/28/2024   Influenza 12/20/2023   AKI (acute kidney injury) 12/19/2023   Influenza A 12/19/2023   Acute on chronic respiratory failure with hypoxia (HCC) 12/19/2023   SOB (shortness of breath) 12/19/2023   Hyperlipidemia 12/26/2022   Malignant neoplasm of prostate (HCC) 01/20/2019   Laceration of finger of left hand 03/25/2018   Pain in finger of left hand 03/25/2018   Lumbar radiculopathy 12/05/2016    Degenerative disc disease, lumbar 11/04/2016   Facet arthropathy, lumbar 11/04/2016   Essential hypertension 08/14/2016   Abnormal nuclear stress test 04/29/2016   Coronary artery disease involving native coronary artery of native heart without angina pectoris    Chronic combined systolic and diastolic heart failure (HCC) 09/01/2015   OA (osteoarthritis) of knee 12/26/2014   Agitation    TIA (transient ischemic attack) 08/22/2014   Tobacco abuse 08/22/2014   Alcohol abuse 08/22/2014   Chronic pain 08/22/2014   Depression 08/22/2014   Confusion    Chronic bilateral low back pain without sciatica 05/27/2013   Pain in joint, lower leg 05/27/2013   Pain syndrome, chronic 05/27/2013   Numbness 04/19/2013   Acute medial meniscal tear 01/15/2012    PCP: Bernardino Boone  REFERRING PROVIDER: Roxie Mess   REFERRING DIAG:  M17.12 (ICD-10-CM) - Unilateral primary osteoarthritis, left knee    THERAPY DIAG:  Status post total left knee replacement  Acute pain of left knee  Stiffness of left knee, not elsewhere classified  Rationale for Evaluation and Treatment: Rehabilitation  ONSET DATE: 06/28/24 L TKA  SUBJECTIVE:   SUBJECTIVE STATEMENT: I have not been able to do too much because of the bleeding. The knee is very sore.   PERTINENT HISTORY: L TKA 06/28/24 ORIF ankle fx 12/07/20 R TKA 12/26/14  CHF, arthritis, COPD, DDD, GERD, neuropathy, hard of hearing  PAIN:  Are you having pain? Yes: NPRS scale: 3-4/10, worst is a 7/10 Pain location: R knee Pain description: dull Aggravating factors: trying to find a comfortable spot to sleep  Relieving factors: pain meds, ice it  PRECAUTIONS: None  RED FLAGS: None   WEIGHT BEARING RESTRICTIONS: No  FALLS:  Has patient fallen in last 6 months? No  LIVING ENVIRONMENT: Lives with: lives with their spouse Lives in: House/apartment Stairs: Yes: Internal: 1 set steps; on left going up and can reach both and External: 2 from  garage, 3 to front door steps; can reach both Has following equipment at home: Single point cane and Walker - 2 wheeled  OCCUPATION: Retired  PLOF: Independent and Independent with gait  PATIENT GOALS: to heal   NEXT MD VISIT: 07/12/24  OBJECTIVE:  Note: Objective measures were completed at Evaluation unless otherwise noted.  DIAGNOSTIC FINDINGS: N/A in chart   COGNITION: Overall cognitive status: Within functional limits for tasks assessed     SENSATION: Light touch: numb around knee  EDEMA:  Swelling around L   POSTURE: rounded shoulders and flexed trunk   PALPATION: Warm, TTP around L knee   LOWER EXTREMITY ROM:  Active ROM Right eval Left eval  Hip flexion    Hip extension    Hip abduction    Hip adduction  Hip internal rotation    Hip external rotation    Knee flexion  100  Knee extension  -20  Ankle dorsiflexion    Ankle plantarflexion    Ankle inversion    Ankle eversion     (Blank rows = not tested)  LOWER EXTREMITY MMT:  MMT Right eval Left eval  Hip flexion  4-  Hip extension    Hip abduction    Hip adduction    Hip internal rotation    Hip external rotation    Knee flexion  3+  Knee extension  3+  Ankle dorsiflexion    Ankle plantarflexion    Ankle inversion    Ankle eversion     (Blank rows = not tested)   FUNCTIONAL TESTS:  5 times sit to stand: 18s from elevated mat and 2 hand push off Timed up and go (TUG): 27s with SPC  GAIT: Distance walked: in clinic distances Assistive device utilized: Single point cane Level of assistance: Modified independence                                                                                                                               TREATMENT DATE:  07/12/24 EVAL    PATIENT EDUCATION:  Education details: POC, HEP, icing  Person educated: Patient Education method: Medical illustrator Education comprehension: verbalized understanding and returned  demonstration  HOME EXERCISE PROGRAM: Access Code: [email or web address] URL: [email or web address] Date: 07/12/2024 Prepared by: Almetta Fam  Exercises - Seated Long Arc Quad  - 1 x daily - 7 x weekly - 2 sets - 10 reps - 3 hold - Seated Knee Flexion  - 1 x daily - 7 x weekly - 2 sets - 10 reps - 3 hold - Supine Active Straight Leg Raise  - 1 x daily - 7 x weekly - 2 sets - 10 reps - Mini Squat with Counter Support  - 1 x daily - 7 x weekly - 2 sets - 10 reps - Sit to Stand with Arms Crossed  - 1 x daily - 7 x weekly - 2 sets - 10 reps  ASSESSMENT:  CLINICAL IMPRESSION: Patient is a 78 y.o. male who was seen today for physical therapy evaluation and treatment for L TKA on 06/28/24. Today is his first day using the cane to ambulate. Just walking back to room and with sit to stand and TUG, patient gets out of breath. His endurance is pretty limiting, and he also complains of back pain that interferes with his ability to move and exercises. He reports he had excessive bleeding and every time he moved it would bleed, so he has not done much in the first 2 weeks. Patient does present with good knee flexion ROM. He will benefit from PT to address his pain, increase his strength and ROM to improve his gait and allow him to return to PLOF.  OBJECTIVE IMPAIRMENTS: Abnormal gait, cardiopulmonary status limiting activity, decreased activity  tolerance, decreased coordination, difficulty walking, decreased ROM, decreased strength, increased edema, and pain.   ACTIVITY LIMITATIONS: squatting, stairs, transfers, and locomotion level  PARTICIPATION LIMITATIONS: driving, shopping, community activity, and yard work  PERSONAL FACTORS: Age and Fitness are also affecting patient's functional outcome.   REHAB POTENTIAL: Good  CLINICAL DECISION MAKING: Stable/uncomplicated  EVALUATION COMPLEXITY: Low  GOALS: Goals reviewed with patient? Yes   SHORT TERM GOALS: Target date: 08/23/24  Independent with  initial HEP. Baseline:  Goal status: INITIAL  2. Patient will be able to ascend/descend stairs with 1 HR and reciprocal step pattern safely to access home and community.  Baseline: doing steps 1 step at a time Goal status: INITIAL  3. Patient will complete TUG <15s w/o AD Baseline: 27s w/SPC Goal status: INITIAL    LONG TERM GOALS: Target date: 10/05/23  Independent with advanced/ongoing HEP to improve outcomes and carryover.  Baseline:  Goal status: INITIAL  2.  Patient will demonstrate L knee flexion to 120 deg to ascend/descend stairs. Baseline: 100 Goal status: INITIAL  3. Patient will demonstrate full L knee extension for safety with gait. Baseline: -20 Goal status: INITIAL  4.  Patient will be able to ambulate 600' safely with LRAD and normal gait pattern to access community.  Baseline:  Goal status: INITIAL  5.  Patient will demonstrate improved functional LE strength by completing 5x STS in <15 seconds.   Baseline: 18s elevated mat and UE push Goal status: INITIAL  PLAN:  PT FREQUENCY: 2x/week  PT DURATION: 12 weeks  PLANNED INTERVENTIONS: 97110-Therapeutic exercises, 97530- Therapeutic activity, 97112- Neuromuscular re-education, 97535- Self Care, [id number]- Manual therapy, [phone]- Gait training, [phone]- Vasopneumatic device, Patient/Family education, Balance training, Stair training, Joint mobilization, Scar mobilization, Cryotherapy, and Moist heat  PLAN FOR NEXT SESSION: L knee PROM, L knee and functional strengthening as tolerated, vaso if he needs   Almetta Fam, PT 07/12/2024, 5:31 PM

## 2024-07-13 ENCOUNTER — Ambulatory Visit: Admitting: Physical Therapy

## 2024-07-15 ENCOUNTER — Ambulatory Visit: Admitting: Physical Therapy

## 2024-07-19 ENCOUNTER — Ambulatory Visit: Admitting: Physical Therapy

## 2024-07-20 ENCOUNTER — Ambulatory Visit: Admitting: Physical Therapy

## 2024-07-21 ENCOUNTER — Encounter: Payer: Self-pay | Admitting: Physical Therapy

## 2024-07-21 ENCOUNTER — Ambulatory Visit: Admitting: Physical Therapy

## 2024-07-21 DIAGNOSIS — M25562 Pain in left knee: Secondary | ICD-10-CM

## 2024-07-21 DIAGNOSIS — Z96652 Presence of left artificial knee joint: Secondary | ICD-10-CM | POA: Diagnosis not present

## 2024-07-21 DIAGNOSIS — M25662 Stiffness of left knee, not elsewhere classified: Secondary | ICD-10-CM

## 2024-07-21 NOTE — Therapy (Signed)
 OUTPATIENT PHYSICAL THERAPY LOWER EXTREMITY TREATMENT   Patient Name: Joseph Foster MRN: [id number] DOB:July 21, 1946, 78 y.o., male Today's Date: 07/21/2024  END OF SESSION:  PT End of Session - 07/21/24 1302     Visit Number 2    Date for Recertification  10/04/24    PT Start Time 1300    PT Stop Time 1345    PT Time Calculation (min) 45 min    Activity Tolerance Patient limited by pain    Behavior During Therapy [facility] for tasks assessed/performed          Past Medical History:  Diagnosis Date   Acute medial meniscus tear of left knee    Anxiety    Aortic dilatation 05/12/2018   Mild, noted on ECHO   Arthritis KNEES   CHF (congestive heart failure) (HCC)    Chronic back pain    Chronic combined systolic and diastolic heart failure (HCC)    Closed left ankle fracture    COPD (chronic obstructive pulmonary disease) (HCC)    asymptomatic, denies shortness of breath   DDD (degenerative disc disease), lumbar L4   GERD (gastroesophageal reflux disease)    Grade I diastolic dysfunction 05/12/2018   Noted on ECHO   HOH (hard of hearing)    BIL hearing aides   Hyperlipidemia    Hypertension    Insomnia    LAD stenosis 03/2016   Mild, noted on heart catheterization   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   Noted on EKG   Lung nodule 2010   Left lung base nodule   Memory loss    Neuromuscular disorder (HCC)    neuropathy feet   Numbness and tingling in both hands    while riding motorcycle   Numbness and tingling of both feet    pinched nerve   Pneumonia    age 49's   Prostate cancer (HCC)    Ringing in ears    Resolved   TIA (transient ischemic attack) 2015   Past Surgical History:  Procedure Laterality Date   CARDIAC CATHETERIZATION N/A 04/29/2016   Procedure: Left Heart Cath and Coronary Angiography;  Surgeon: Alm LELON Clay, MD;  Location: [facility] INVASIVE CV LAB;  Service: Cardiovascular;  Laterality: N/A;   COLONOSCOPY     CYSTOSCOPY N/A 03/15/2019    Procedure: CYSTOSCOPY;  Surgeon: Ottelin, Mark, MD;  Location: [facility];  Service: Urology;  Laterality: N/A;  2 seeds detected and removed by Dr. Ceil   [facility] EEG 41-60MINS  08/23/2014       KNEE ARTHROSCOPY  01/15/2012   Procedure: ARTHROSCOPY KNEE;  Surgeon: Dempsey LULLA Moan, MD;  Location: [facility];  Service: Orthopedics;  Laterality: Left;  meniscal debridement   ORIF ANKLE FRACTURE Left 12/07/2020   Procedure: OPEN REDUCTION INTERNAL FIXATION (ORIF) left lateral malleolus fracture; stress exam left foot;  Surgeon: Kit Norleen, MD;  Location: [facility];  Service: Orthopedics;  Laterality: Left;    RADIOACTIVE SEED IMPLANT N/A 03/15/2019   Procedure: RADIOACTIVE SEED IMPLANT/BRACHYTHERAPY IMPLANT;  Surgeon: Ottelin, Mark, MD;  Location: [facility] [place];  Service: Urology;  Laterality: N/A;  81 seeds   RIGHT KNEE SURG.  2005   SPINAL CORD STIMULATOR IMPLANT  2009   Still in place battery is not working in years   TONSILECTOMY/ADENOIDECTOMY WITH MYRINGOTOMY     TOTAL KNEE ARTHROPLASTY Right 12/26/2014   Procedure: TOTAL RIGHT  KNEE ARTHROPLASTY;  Surgeon: Dempsey Moan, MD;  Location: [facility];  Service: Orthopedics;  Laterality: Right;   TOTAL KNEE ARTHROPLASTY Left 06/28/2024   Procedure: ARTHROPLASTY, KNEE, TOTAL;  Surgeon: Melodi Lerner, MD;  Location: [facility];  Service: Orthopedics;  Laterality: Left;   UPPER GI ENDOSCOPY     VASECTOMY     Patient Active Problem List   Diagnosis Date Noted   Primary osteoarthritis of left knee 06/28/2024   Influenza 12/20/2023   AKI (acute kidney injury) 12/19/2023   Influenza A 12/19/2023   Acute on chronic respiratory failure with hypoxia (HCC) 12/19/2023   SOB (shortness of breath) 12/19/2023   Hyperlipidemia 12/26/2022   Malignant neoplasm of prostate (HCC) 01/20/2019   Laceration of finger of left hand 03/25/2018   Pain in finger of left hand 03/25/2018   Lumbar  radiculopathy 12/05/2016   Degenerative disc disease, lumbar 11/04/2016   Facet arthropathy, lumbar 11/04/2016   Essential hypertension 08/14/2016   Abnormal nuclear stress test 04/29/2016   Coronary artery disease involving native coronary artery of native heart without angina pectoris    Chronic combined systolic and diastolic heart failure (HCC) 09/01/2015   OA (osteoarthritis) of knee 12/26/2014   Agitation    TIA (transient ischemic attack) 08/22/2014   Tobacco abuse 08/22/2014   Alcohol abuse 08/22/2014   Chronic pain 08/22/2014   Depression 08/22/2014   Confusion    Chronic bilateral low back pain without sciatica 05/27/2013   Pain in joint, lower leg 05/27/2013   Pain syndrome, chronic 05/27/2013   Numbness 04/19/2013   Acute medial meniscal tear 01/15/2012    PCP: Bernardino Boone  REFERRING PROVIDER: Roxie Mess   REFERRING DIAG:  M17.12 (ICD-10-CM) - Unilateral primary osteoarthritis, left knee    THERAPY DIAG:  Status post total left knee replacement  Acute pain of left knee  Stiffness of left knee, not elsewhere classified  Rationale for Evaluation and Treatment: Rehabilitation  ONSET DATE: 06/28/24 L TKA  SUBJECTIVE:   SUBJECTIVE STATEMENT: Been sore  PERTINENT HISTORY: L TKA 06/28/24 ORIF ankle fx 12/07/20 R TKA 12/26/14  CHF, arthritis, COPD, DDD, GERD, neuropathy, hard of hearing  PAIN:  Are you having pain? Yes: NPRS scale: 7/10 Pain location: R knee Pain description: dull Aggravating factors: trying to find a comfortable spot to sleep  Relieving factors: pain meds, ice it  PRECAUTIONS: None  RED FLAGS: None   WEIGHT BEARING RESTRICTIONS: No  FALLS:  Has patient fallen in last 6 months? No  LIVING ENVIRONMENT: Lives with: lives with their spouse Lives in: House/apartment Stairs: Yes: Internal: 1 set steps; on left going up and can reach both and External: 2 from garage, 3 to front door steps; can reach both Has following  equipment at home: Single point cane and Walker - 2 wheeled  OCCUPATION: Retired  PLOF: Independent and Independent with gait  PATIENT GOALS: to heal   NEXT MD VISIT: 07/12/24  OBJECTIVE:  Note: Objective measures were completed at Evaluation unless otherwise noted.  DIAGNOSTIC FINDINGS: N/A in chart   COGNITION: Overall cognitive status: Within functional limits for tasks assessed     SENSATION: Light touch: numb around knee  EDEMA:  Swelling around L   POSTURE: rounded shoulders and flexed trunk   PALPATION: Warm, TTP around L knee   LOWER EXTREMITY ROM:  Active ROM Right eval Left eval  Hip flexion    Hip extension    Hip abduction    Hip adduction    Hip internal rotation    Hip external rotation    Knee flexion  100  Knee extension  -20  Ankle dorsiflexion    Ankle plantarflexion    Ankle inversion    Ankle eversion     (Blank rows = not tested)  LOWER EXTREMITY MMT:  MMT Right eval Left eval  Hip flexion  4-  Hip extension    Hip abduction    Hip adduction    Hip internal rotation    Hip external rotation    Knee flexion  3+  Knee extension  3+  Ankle dorsiflexion    Ankle plantarflexion    Ankle inversion    Ankle eversion     (Blank rows = not tested)   FUNCTIONAL TESTS:  5 times sit to stand: 18s from elevated mat and 2 hand push off Timed up and go (TUG): 27s with SPC  GAIT: Distance walked: in clinic distances Assistive device utilized: Single point cane Level of assistance: Modified independence                                                                                                                               TREATMENT DATE:  07/21/24 NuStep L5 x 6 min L knee PROM w/ end range holds   Patellar mobs  Quad sets LLE x15 Sit to stand 2x10 elevated surface  LLE LAQ 3lb 2x15  07/12/24 EVAL    PATIENT EDUCATION:  Education details: POC, HEP, icing  Person educated: Patient Education method: Software engineer Education comprehension: verbalized understanding and returned demonstration  HOME EXERCISE PROGRAM: Access Code: [email or web address] URL: [email or web address] Date: 07/12/2024 Prepared by: Almetta Fam  Exercises - Seated Long Arc Quad  - 1 x daily - 7 x weekly - 2 sets - 10 reps - 3 hold - Seated Knee Flexion  - 1 x daily - 7 x weekly - 2 sets - 10 reps - 3 hold - Supine Active Straight Leg Raise  - 1 x daily - 7 x weekly - 2 sets - 10 reps - Mini Squat with Counter Support  - 1 x daily - 7 x weekly - 2 sets - 10 reps - Sit to Stand with Arms Crossed  - 1 x daily - 7 x weekly - 2 sets - 10 reps  ASSESSMENT:  CLINICAL IMPRESSION: Patient is a 78 y.o. male who was seen today for physical therapy treatment for L TKA on 06/28/24.  Pt reports some pain and soreness in his L knee upon entering.  L knee is warm and swollen, tactile cues needed to get good quad activation.   Good knee flexion ROM remains.  Compensation noted with sit to stands. He will benefit from PT to address his pain, increase his strength and ROM to improve his gait and allow him to return to PLOF.  OBJECTIVE IMPAIRMENTS: Abnormal gait, cardiopulmonary status limiting activity, decreased activity tolerance, decreased coordination, difficulty walking, decreased ROM, decreased strength, increased edema, and pain.   ACTIVITY LIMITATIONS: squatting, stairs, transfers, and locomotion  level  PARTICIPATION LIMITATIONS: driving, shopping, community activity, and yard work  PERSONAL FACTORS: Age and Fitness are also affecting patient's functional outcome.   REHAB POTENTIAL: Good  CLINICAL DECISION MAKING: Stable/uncomplicated  EVALUATION COMPLEXITY: Low  GOALS: Goals reviewed with patient? Yes   SHORT TERM GOALS: Target date: 08/23/24  Independent with initial HEP. Baseline:  Goal status: INITIAL  2. Patient will be able to ascend/descend stairs with 1 HR and reciprocal step pattern safely to access  home and community.  Baseline: doing steps 1 step at a time Goal status: INITIAL  3. Patient will complete TUG <15s w/o AD Baseline: 27s w/SPC Goal status: INITIAL    LONG TERM GOALS: Target date: 10/05/23  Independent with advanced/ongoing HEP to improve outcomes and carryover.  Baseline:  Goal status: INITIAL  2.  Patient will demonstrate L knee flexion to 120 deg to ascend/descend stairs. Baseline: 100 Goal status: INITIAL  3. Patient will demonstrate full L knee extension for safety with gait. Baseline: -20 Goal status: INITIAL  4.  Patient will be able to ambulate 600' safely with LRAD and normal gait pattern to access community.  Baseline:  Goal status: INITIAL  5.  Patient will demonstrate improved functional LE strength by completing 5x STS in <15 seconds.   Baseline: 18s elevated mat and UE push Goal status: INITIAL  PLAN:  PT FREQUENCY: 2x/week  PT DURATION: 12 weeks  PLANNED INTERVENTIONS: 97110-Therapeutic exercises, 97530- Therapeutic activity, 97112- Neuromuscular re-education, 97535- Self Care, [id number]- Manual therapy, [phone]- Gait training, [phone]- Vasopneumatic device, Patient/Family education, Balance training, Stair training, Joint mobilization, Scar mobilization, Cryotherapy, and Moist heat  PLAN FOR NEXT SESSION: L knee PROM, L knee and functional strengthening as tolerated, vaso if he needs   Tanda KANDICE Sorrow, PTA 07/21/2024, 1:03 PM

## 2024-07-22 ENCOUNTER — Ambulatory Visit

## 2024-07-27 ENCOUNTER — Ambulatory Visit: Admitting: Physical Therapy

## 2024-07-27 ENCOUNTER — Encounter: Payer: Self-pay | Admitting: Physical Therapy

## 2024-07-27 DIAGNOSIS — M25562 Pain in left knee: Secondary | ICD-10-CM

## 2024-07-27 DIAGNOSIS — Z96652 Presence of left artificial knee joint: Secondary | ICD-10-CM | POA: Diagnosis not present

## 2024-07-27 DIAGNOSIS — M25662 Stiffness of left knee, not elsewhere classified: Secondary | ICD-10-CM

## 2024-07-27 NOTE — Therapy (Signed)
 OUTPATIENT PHYSICAL THERAPY LOWER EXTREMITY TREATMENT   Patient Name: Joseph Foster MRN: [id number] DOB:September 24, 1946, 78 y.o., male Today's Date: 07/27/2024  END OF SESSION:  PT End of Session - 07/27/24 1300     Visit Number 3    Date for Recertification  10/04/24    PT Start Time 1300    PT Stop Time 1345    PT Time Calculation (min) 45 min    Activity Tolerance Patient limited by pain    Behavior During Therapy [facility] for tasks assessed/performed          Past Medical History:  Diagnosis Date   Acute medial meniscus tear of left knee    Anxiety    Aortic dilatation 05/12/2018   Mild, noted on ECHO   Arthritis KNEES   CHF (congestive heart failure) (HCC)    Chronic back pain    Chronic combined systolic and diastolic heart failure (HCC)    Closed left ankle fracture    COPD (chronic obstructive pulmonary disease) (HCC)    asymptomatic, denies shortness of breath   DDD (degenerative disc disease), lumbar L4   GERD (gastroesophageal reflux disease)    Grade I diastolic dysfunction 05/12/2018   Noted on ECHO   HOH (hard of hearing)    BIL hearing aides   Hyperlipidemia    Hypertension    Insomnia    LAD stenosis 03/2016   Mild, noted on heart catheterization   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   Noted on EKG   Lung nodule 2010   Left lung base nodule   Memory loss    Neuromuscular disorder (HCC)    neuropathy feet   Numbness and tingling in both hands    while riding motorcycle   Numbness and tingling of both feet    pinched nerve   Pneumonia    age 87's   Prostate cancer (HCC)    Ringing in ears    Resolved   TIA (transient ischemic attack) 2015   Past Surgical History:  Procedure Laterality Date   CARDIAC CATHETERIZATION N/A 04/29/2016   Procedure: Left Heart Cath and Coronary Angiography;  Surgeon: Alm LELON Clay, MD;  Location: [facility] INVASIVE CV LAB;  Service: Cardiovascular;  Laterality: N/A;   COLONOSCOPY     CYSTOSCOPY N/A 03/15/2019    Procedure: CYSTOSCOPY;  Surgeon: Ottelin, Mark, MD;  Location: [facility];  Service: Urology;  Laterality: N/A;  2 seeds detected and removed by Dr. Ceil   [facility] EEG 41-60MINS  08/23/2014       KNEE ARTHROSCOPY  01/15/2012   Procedure: ARTHROSCOPY KNEE;  Surgeon: Dempsey LULLA Moan, MD;  Location: [facility];  Service: Orthopedics;  Laterality: Left;  meniscal debridement   ORIF ANKLE FRACTURE Left 12/07/2020   Procedure: OPEN REDUCTION INTERNAL FIXATION (ORIF) left lateral malleolus fracture; stress exam left foot;  Surgeon: Kit Norleen, MD;  Location: [facility];  Service: Orthopedics;  Laterality: Left;    RADIOACTIVE SEED IMPLANT N/A 03/15/2019   Procedure: RADIOACTIVE SEED IMPLANT/BRACHYTHERAPY IMPLANT;  Surgeon: Ottelin, Mark, MD;  Location: [facility];  Service: Urology;  Laterality: N/A;  81 seeds   RIGHT KNEE SURG.  2005   SPINAL CORD STIMULATOR IMPLANT  2009   Still in place battery is not working in years   TONSILECTOMY/ADENOIDECTOMY WITH MYRINGOTOMY     TOTAL KNEE ARTHROPLASTY Right 12/26/2014   Procedure: TOTAL RIGHT  KNEE ARTHROPLASTY;  Surgeon: Dempsey Moan, MD;  Location: [facility];  Service: Orthopedics;  Laterality: Right;   TOTAL KNEE ARTHROPLASTY Left 06/28/2024   Procedure: ARTHROPLASTY, KNEE, TOTAL;  Surgeon: Melodi Lerner, MD;  Location: [facility];  Service: Orthopedics;  Laterality: Left;   UPPER GI ENDOSCOPY     VASECTOMY     Patient Active Problem List   Diagnosis Date Noted   Primary osteoarthritis of left knee 06/28/2024   Influenza 12/20/2023   AKI (acute kidney injury) 12/19/2023   Influenza A 12/19/2023   Acute on chronic respiratory failure with hypoxia (HCC) 12/19/2023   SOB (shortness of breath) 12/19/2023   Hyperlipidemia 12/26/2022   Malignant neoplasm of prostate (HCC) 01/20/2019   Laceration of finger of left hand 03/25/2018   Pain in finger of left hand 03/25/2018   Lumbar  radiculopathy 12/05/2016   Degenerative disc disease, lumbar 11/04/2016   Facet arthropathy, lumbar 11/04/2016   Essential hypertension 08/14/2016   Abnormal nuclear stress test 04/29/2016   Coronary artery disease involving native coronary artery of native heart without angina pectoris    Chronic combined systolic and diastolic heart failure (HCC) 09/01/2015   OA (osteoarthritis) of knee 12/26/2014   Agitation    TIA (transient ischemic attack) 08/22/2014   Tobacco abuse 08/22/2014   Alcohol abuse 08/22/2014   Chronic pain 08/22/2014   Depression 08/22/2014   Confusion    Chronic bilateral low back pain without sciatica 05/27/2013   Pain in joint, lower leg 05/27/2013   Pain syndrome, chronic 05/27/2013   Numbness 04/19/2013   Acute medial meniscal tear 01/15/2012    PCP: Bernardino Boone  REFERRING PROVIDER: Roxie Mess   REFERRING DIAG:  M17.12 (ICD-10-CM) - Unilateral primary osteoarthritis, left knee    THERAPY DIAG:  Status post total left knee replacement  Acute pain of left knee  Stiffness of left knee, not elsewhere classified  Rationale for Evaluation and Treatment: Rehabilitation  ONSET DATE: 06/28/24 L TKA  SUBJECTIVE:   SUBJECTIVE STATEMENT: Throbbing since last session  PERTINENT HISTORY: L TKA 06/28/24 ORIF ankle fx 12/07/20 R TKA 12/26/14  CHF, arthritis, COPD, DDD, GERD, neuropathy, hard of hearing  PAIN:  Are you having pain? Yes: NPRS scale: 7/10 Pain location: R knee Pain description: dull Aggravating factors: trying to find a comfortable spot to sleep  Relieving factors: pain meds, ice it  PRECAUTIONS: None  RED FLAGS: None   WEIGHT BEARING RESTRICTIONS: No  FALLS:  Has patient fallen in last 6 months? No  LIVING ENVIRONMENT: Lives with: lives with their spouse Lives in: House/apartment Stairs: Yes: Internal: 1 set steps; on left going up and can reach both and External: 2 from garage, 3 to front door steps; can reach  both Has following equipment at home: Single point cane and Walker - 2 wheeled  OCCUPATION: Retired  PLOF: Independent and Independent with gait  PATIENT GOALS: to heal   NEXT MD VISIT: 07/12/24  OBJECTIVE:  Note: Objective measures were completed at Evaluation unless otherwise noted.  DIAGNOSTIC FINDINGS: N/A in chart   COGNITION: Overall cognitive status: Within functional limits for tasks assessed     SENSATION: Light touch: numb around knee  EDEMA:  Swelling around L   POSTURE: rounded shoulders and flexed trunk   PALPATION: Warm, TTP around L knee   LOWER EXTREMITY ROM:  Active ROM Left eval Left 07/27/24  Hip flexion    Hip extension    Hip abduction    Hip adduction    Hip internal rotation    Hip external rotation    Knee  flexion 100 115  Knee extension -20 11  Ankle dorsiflexion    Ankle plantarflexion    Ankle inversion    Ankle eversion     (Blank rows = not tested)  LOWER EXTREMITY MMT:  MMT Right eval Left eval  Hip flexion  4-  Hip extension    Hip abduction    Hip adduction    Hip internal rotation    Hip external rotation    Knee flexion  3+  Knee extension  3+  Ankle dorsiflexion    Ankle plantarflexion    Ankle inversion    Ankle eversion     (Blank rows = not tested)   FUNCTIONAL TESTS:  5 times sit to stand: 18s from elevated mat and 2 hand push off Timed up and go (TUG): 27s with SPC  GAIT: Distance walked: in clinic distances Assistive device utilized: Single point cane Level of assistance: Modified independence                                                                                                                               TREATMENT DATE:  07/27/24 NuStep L 5 x 6 min L knee PROM w/ end range holds   Patellar mobs Goals  ROM  TUG 12.45 Sit to stand x10, x5 from elevated surface some reps with UE Leg press 40lb 2x10, RLE 20lb 2x5 Leg Ext 3lb 3x10   07/21/24 NuStep L5 x 6 min L knee PROM w/  end range holds   Patellar mobs  Quad sets LLE x15 Sit to stand 2x10 elevated surface  LLE LAQ 3lb 2x15  07/12/24 EVAL    PATIENT EDUCATION:  Education details: POC, HEP, icing  Person educated: Patient Education method: Medical Illustrator Education comprehension: verbalized understanding and returned demonstration  HOME EXERCISE PROGRAM: Access Code: [email or web address] URL: [email or web address] Date: 07/12/2024 Prepared by: Almetta Fam  Exercises - Seated Long Arc Quad  - 1 x daily - 7 x weekly - 2 sets - 10 reps - 3 hold - Seated Knee Flexion  - 1 x daily - 7 x weekly - 2 sets - 10 reps - 3 hold - Supine Active Straight Leg Raise  - 1 x daily - 7 x weekly - 2 sets - 10 reps - Mini Squat with Counter Support  - 1 x daily - 7 x weekly - 2 sets - 10 reps - Sit to Stand with Arms Crossed  - 1 x daily - 7 x weekly - 2 sets - 10 reps  ASSESSMENT:  CLINICAL IMPRESSION: Patient is a 78 y.o. male who was seen today for physical therapy treatment for L TKA on 06/28/24.  Pt reports some pain and soreness since last session.  L knee is warm and swollen.  Cue for eccentric control and TKE needed while on leg press.  Pt has progresses increasing hos L knee AROM.  Compensation noted with sit to stands. He will benefit from  PT to address his pain, increase his strength and ROM to improve his gait and allow him to return to PLOF.  OBJECTIVE IMPAIRMENTS: Abnormal gait, cardiopulmonary status limiting activity, decreased activity tolerance, decreased coordination, difficulty walking, decreased ROM, decreased strength, increased edema, and pain.   ACTIVITY LIMITATIONS: squatting, stairs, transfers, and locomotion level  PARTICIPATION LIMITATIONS: driving, shopping, community activity, and yard work  PERSONAL FACTORS: Age and Fitness are also affecting patient's functional outcome.   REHAB POTENTIAL: Good  CLINICAL DECISION MAKING: Stable/uncomplicated  EVALUATION  COMPLEXITY: Low  GOALS: Goals reviewed with patient? Yes   SHORT TERM GOALS: Target date: 08/23/24  Independent with initial HEP. Baseline:  Goal status: Met 07/27/24  2. Patient will be able to ascend/descend stairs with 1 HR and reciprocal step pattern safely to access home and community.  Baseline: doing steps 1 step at a time Goal status: INITIAL  3. Patient will complete TUG <15s w/o AD Baseline: 27s w/SPC Goal status: Met 12.45 07/27/24    LONG TERM GOALS: Target date: 10/05/23  Independent with advanced/ongoing HEP to improve outcomes and carryover.  Baseline:  Goal status: INITIAL  2.  Patient will demonstrate L knee flexion to 120 deg to ascend/descend stairs. Baseline: 100 Goal status: Progressing 07/27/24  3. Patient will demonstrate full L knee extension for safety with gait. Baseline: -20 Goal status: Progressing 07/27/24  4.  Patient will be able to ambulate 600' safely with LRAD and normal gait pattern to access community.  Baseline:  Goal status: INITIAL  5.  Patient will demonstrate improved functional LE strength by completing 5x STS in <15 seconds.   Baseline: 18s elevated mat and UE push Goal status: INITIAL  PLAN:  PT FREQUENCY: 2x/week  PT DURATION: 12 weeks  PLANNED INTERVENTIONS: 97110-Therapeutic exercises, 97530- Therapeutic activity, 97112- Neuromuscular re-education, 97535- Self Care, 02859- Manual therapy, [phone]- Gait training, [phone]- Vasopneumatic device, Patient/Family education, Balance training, Stair training, Joint mobilization, Scar mobilization, Cryotherapy, and Moist heat  PLAN FOR NEXT SESSION: L knee PROM, L knee and functional strengthening as tolerated, vaso if he needs   Tanda KANDICE Sorrow, PTA 07/27/2024, 1:00 PM

## 2024-07-29 ENCOUNTER — Ambulatory Visit: Admitting: Physical Therapy

## 2024-07-29 ENCOUNTER — Encounter: Payer: Self-pay | Admitting: Physical Therapy

## 2024-07-29 DIAGNOSIS — M25562 Pain in left knee: Secondary | ICD-10-CM

## 2024-07-29 DIAGNOSIS — Z96652 Presence of left artificial knee joint: Secondary | ICD-10-CM

## 2024-07-29 DIAGNOSIS — M25662 Stiffness of left knee, not elsewhere classified: Secondary | ICD-10-CM

## 2024-07-29 NOTE — Therapy (Signed)
 OUTPATIENT PHYSICAL THERAPY LOWER EXTREMITY TREATMENT   Patient Name: Joseph Foster MRN: [id number] DOB:12-10-1945, 78 y.o., male Today's Date: 07/29/2024  END OF SESSION:  PT End of Session - 07/29/24 1343     Visit Number 4    Date for Recertification  10/04/24    PT Start Time 1345    PT Stop Time 1430    PT Time Calculation (min) 45 min    Activity Tolerance Patient limited by pain;Patient tolerated treatment well    Behavior During Therapy [facility] for tasks assessed/performed          Past Medical History:  Diagnosis Date   Acute medial meniscus tear of left knee    Anxiety    Aortic dilatation 05/12/2018   Mild, noted on ECHO   Arthritis KNEES   CHF (congestive heart failure) (HCC)    Chronic back pain    Chronic combined systolic and diastolic heart failure (HCC)    Closed left ankle fracture    COPD (chronic obstructive pulmonary disease) (HCC)    asymptomatic, denies shortness of breath   DDD (degenerative disc disease), lumbar L4   GERD (gastroesophageal reflux disease)    Grade I diastolic dysfunction 05/12/2018   Noted on ECHO   HOH (hard of hearing)    BIL hearing aides   Hyperlipidemia    Hypertension    Insomnia    LAD stenosis 03/2016   Mild, noted on heart catheterization   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   Noted on EKG   Lung nodule 2010   Left lung base nodule   Memory loss    Neuromuscular disorder (HCC)    neuropathy feet   Numbness and tingling in both hands    while riding motorcycle   Numbness and tingling of both feet    pinched nerve   Pneumonia    age 32's   Prostate cancer (HCC)    Ringing in ears    Resolved   TIA (transient ischemic attack) 2015   Past Surgical History:  Procedure Laterality Date   CARDIAC CATHETERIZATION N/A 04/29/2016   Procedure: Left Heart Cath and Coronary Angiography;  Surgeon: Alm LELON Clay, MD;  Location: [facility] INVASIVE CV LAB;  Service: Cardiovascular;  Laterality: N/A;   COLONOSCOPY      CYSTOSCOPY N/A 03/15/2019   Procedure: CYSTOSCOPY;  Surgeon: Ottelin, Mark, MD;  Location: [facility];  Service: Urology;  Laterality: N/A;  2 seeds detected and removed by Dr. Ceil   [facility] EEG 41-60MINS  08/23/2014       KNEE ARTHROSCOPY  01/15/2012   Procedure: ARTHROSCOPY KNEE;  Surgeon: Dempsey LULLA Moan, MD;  Location: [facility];  Service: Orthopedics;  Laterality: Left;  meniscal debridement   ORIF ANKLE FRACTURE Left 12/07/2020   Procedure: OPEN REDUCTION INTERNAL FIXATION (ORIF) left lateral malleolus fracture; stress exam left foot;  Surgeon: Kit Norleen, MD;  Location: [facility];  Service: Orthopedics;  Laterality: Left;    RADIOACTIVE SEED IMPLANT N/A 03/15/2019   Procedure: RADIOACTIVE SEED IMPLANT/BRACHYTHERAPY IMPLANT;  Surgeon: Ottelin, Mark, MD;  Location: [facility];  Service: Urology;  Laterality: N/A;  81 seeds   RIGHT KNEE SURG.  2005   SPINAL CORD STIMULATOR IMPLANT  2009   Still in place battery is not working in years   TONSILECTOMY/ADENOIDECTOMY WITH MYRINGOTOMY     TOTAL KNEE ARTHROPLASTY Right 12/26/2014   Procedure: TOTAL RIGHT  KNEE ARTHROPLASTY;  Surgeon: Dempsey Moan, MD;  Location: [facility];  Service: Orthopedics;  Laterality: Right;   TOTAL KNEE ARTHROPLASTY Left 06/28/2024   Procedure: ARTHROPLASTY, KNEE, TOTAL;  Surgeon: Melodi Lerner, MD;  Location: [facility];  Service: Orthopedics;  Laterality: Left;   UPPER GI ENDOSCOPY     VASECTOMY     Patient Active Problem List   Diagnosis Date Noted   Primary osteoarthritis of left knee 06/28/2024   Influenza 12/20/2023   AKI (acute kidney injury) 12/19/2023   Influenza A 12/19/2023   Acute on chronic respiratory failure with hypoxia (HCC) 12/19/2023   SOB (shortness of breath) 12/19/2023   Hyperlipidemia 12/26/2022   Malignant neoplasm of prostate (HCC) 01/20/2019   Laceration of finger of left hand 03/25/2018   Pain in finger of left hand  03/25/2018   Lumbar radiculopathy 12/05/2016   Degenerative disc disease, lumbar 11/04/2016   Facet arthropathy, lumbar 11/04/2016   Essential hypertension 08/14/2016   Abnormal nuclear stress test 04/29/2016   Coronary artery disease involving native coronary artery of native heart without angina pectoris    Chronic combined systolic and diastolic heart failure (HCC) 09/01/2015   OA (osteoarthritis) of knee 12/26/2014   Agitation    TIA (transient ischemic attack) 08/22/2014   Tobacco abuse 08/22/2014   Alcohol abuse 08/22/2014   Chronic pain 08/22/2014   Depression 08/22/2014   Confusion    Chronic bilateral low back pain without sciatica 05/27/2013   Pain in joint, lower leg 05/27/2013   Pain syndrome, chronic 05/27/2013   Numbness 04/19/2013   Acute medial meniscal tear 01/15/2012    PCP: Bernardino Boone  REFERRING PROVIDER: Roxie Mess   REFERRING DIAG:  M17.12 (ICD-10-CM) - Unilateral primary osteoarthritis, left knee    THERAPY DIAG:  Status post total left knee replacement  Acute pain of left knee  Stiffness of left knee, not elsewhere classified  Rationale for Evaluation and Treatment: Rehabilitation  ONSET DATE: 06/28/24 L TKA  SUBJECTIVE:   SUBJECTIVE STATEMENT: Pretty good, knee is a ot looser than it was, still hurts  PERTINENT HISTORY: L TKA 06/28/24 ORIF ankle fx 12/07/20 R TKA 12/26/14  CHF, arthritis, COPD, DDD, GERD, neuropathy, hard of hearing  PAIN:  Are you having pain? Yes: NPRS scale: 5/10 Pain location: R knee Pain description: dull Aggravating factors: trying to find a comfortable spot to sleep  Relieving factors: pain meds, ice it  PRECAUTIONS: None  RED FLAGS: None   WEIGHT BEARING RESTRICTIONS: No  FALLS:  Has patient fallen in last 6 months? No  LIVING ENVIRONMENT: Lives with: lives with their spouse Lives in: House/apartment Stairs: Yes: Internal: 1 set steps; on left going up and can reach both and External: 2 from  garage, 3 to front door steps; can reach both Has following equipment at home: Single point cane and Walker - 2 wheeled  OCCUPATION: Retired  PLOF: Independent and Independent with gait  PATIENT GOALS: to heal   NEXT MD VISIT: 07/12/24  OBJECTIVE:  Note: Objective measures were completed at Evaluation unless otherwise noted.  DIAGNOSTIC FINDINGS: N/A in chart   COGNITION: Overall cognitive status: Within functional limits for tasks assessed     SENSATION: Light touch: numb around knee  EDEMA:  Swelling around L   POSTURE: rounded shoulders and flexed trunk   PALPATION: Warm, TTP around L knee   LOWER EXTREMITY ROM:  Active ROM Left eval Left 07/27/24  Hip flexion    Hip extension    Hip abduction    Hip adduction    Hip  internal rotation    Hip external rotation    Knee flexion 100 115  Knee extension -20 11  Ankle dorsiflexion    Ankle plantarflexion    Ankle inversion    Ankle eversion     (Blank rows = not tested)  LOWER EXTREMITY MMT:  MMT Right eval Left eval  Hip flexion  4-  Hip extension    Hip abduction    Hip adduction    Hip internal rotation    Hip external rotation    Knee flexion  3+  Knee extension  3+  Ankle dorsiflexion    Ankle plantarflexion    Ankle inversion    Ankle eversion     (Blank rows = not tested)   FUNCTIONAL TESTS:  5 times sit to stand: 18s from elevated mat and 2 hand push off Timed up and go (TUG): 27s with SPC  GAIT: Distance walked: in clinic distances Assistive device utilized: Single point cane Level of assistance: Modified independence                                                                                                                               TREATMENT DATE:  07/29/24 NuStep L 5  x 6 min 40lb resisted gait 4 way x 3 each 6in step ups x 10 each  L knee PROM w/ end range holds   Patellar mobs Leg Ext 10lb 2x10  HS curls 35lb 2x10 Sit to stand x 10   07/27/24 NuStep L 5 x 6  min L knee PROM w/ end range holds   Patellar mobs Goals  ROM  TUG 12.45 Sit to stand x10, x5 from elevated surface some reps with UE Leg press 40lb 2x10, RLE 20lb 2x5 Leg Ext 3lb 3x10   07/21/24 NuStep L5 x 6 min L knee PROM w/ end range holds   Patellar mobs  Quad sets LLE x15 Sit to stand 2x10 elevated surface  LLE LAQ 3lb 2x15  07/12/24 EVAL    PATIENT EDUCATION:  Education details: POC, HEP, icing  Person educated: Patient Education method: Medical Illustrator Education comprehension: verbalized understanding and returned demonstration  HOME EXERCISE PROGRAM: Access Code: [email or web address] URL: [email or web address] Date: 07/12/2024 Prepared by: Almetta Fam  Exercises - Seated Long Arc Quad  - 1 x daily - 7 x weekly - 2 sets - 10 reps - 3 hold - Seated Knee Flexion  - 1 x daily - 7 x weekly - 2 sets - 10 reps - 3 hold - Supine Active Straight Leg Raise  - 1 x daily - 7 x weekly - 2 sets - 10 reps - Mini Squat with Counter Support  - 1 x daily - 7 x weekly - 2 sets - 10 reps - Sit to Stand with Arms Crossed  - 1 x daily - 7 x weekly - 2 sets - 10 reps  ASSESSMENT:  CLINICAL IMPRESSION: Patient is a 78 y.o. male  who was seen today for physical therapy treatment for L TKA on 06/28/24.  Pt enters feeling better overall, L knee remains warm and swollen.  Cue for eccentric control with leg curls and extensions.  Compensation noted with sit to stands. RUE needed with step ups for stability. He will benefit from PT to address his pain, increase his strength and ROM to improve his gait and allow him to return to PLOF.  OBJECTIVE IMPAIRMENTS: Abnormal gait, cardiopulmonary status limiting activity, decreased activity tolerance, decreased coordination, difficulty walking, decreased ROM, decreased strength, increased edema, and pain.   ACTIVITY LIMITATIONS: squatting, stairs, transfers, and locomotion level  PARTICIPATION LIMITATIONS: driving, shopping,  community activity, and yard work  PERSONAL FACTORS: Age and Fitness are also affecting patient's functional outcome.   REHAB POTENTIAL: Good  CLINICAL DECISION MAKING: Stable/uncomplicated  EVALUATION COMPLEXITY: Low  GOALS: Goals reviewed with patient? Yes   SHORT TERM GOALS: Target date: 08/23/24  Independent with initial HEP. Baseline:  Goal status: Met 07/27/24  2. Patient will be able to ascend/descend stairs with 1 HR and reciprocal step pattern safely to access home and community.  Baseline: doing steps 1 step at a time Goal status: INITIAL  3. Patient will complete TUG <15s w/o AD Baseline: 27s w/SPC Goal status: Met 12.45 07/27/24    LONG TERM GOALS: Target date: 10/05/23  Independent with advanced/ongoing HEP to improve outcomes and carryover.  Baseline:  Goal status: INITIAL  2.  Patient will demonstrate L knee flexion to 120 deg to ascend/descend stairs. Baseline: 100 Goal status: Progressing 07/27/24  3. Patient will demonstrate full L knee extension for safety with gait. Baseline: -20 Goal status: Progressing 07/27/24  4.  Patient will be able to ambulate 600' safely with LRAD and normal gait pattern to access community.  Baseline:  Goal status: INITIAL  5.  Patient will demonstrate improved functional LE strength by completing 5x STS in <15 seconds.   Baseline: 18s elevated mat and UE push Goal status: INITIAL  PLAN:  PT FREQUENCY: 2x/week  PT DURATION: 12 weeks  PLANNED INTERVENTIONS: 97110-Therapeutic exercises, 97530- Therapeutic activity, 97112- Neuromuscular re-education, 97535- Self Care, [id number]- Manual therapy, [phone]- Gait training, [phone]- Vasopneumatic device, Patient/Family education, Balance training, Stair training, Joint mobilization, Scar mobilization, Cryotherapy, and Moist heat  PLAN FOR NEXT SESSION: L knee PROM, L knee and functional strengthening as tolerated, vaso if he needs   Tanda KANDICE Sorrow, PTA 07/29/2024, 1:44  PM

## 2024-08-03 ENCOUNTER — Ambulatory Visit: Attending: Student | Admitting: Physical Therapy

## 2024-08-03 DIAGNOSIS — M25562 Pain in left knee: Secondary | ICD-10-CM | POA: Insufficient documentation

## 2024-08-03 DIAGNOSIS — Z96652 Presence of left artificial knee joint: Secondary | ICD-10-CM | POA: Insufficient documentation

## 2024-08-03 DIAGNOSIS — M25662 Stiffness of left knee, not elsewhere classified: Secondary | ICD-10-CM | POA: Insufficient documentation

## 2024-08-04 ENCOUNTER — Ambulatory Visit

## 2024-08-05 ENCOUNTER — Encounter: Payer: Self-pay | Admitting: Physical Therapy

## 2024-08-05 ENCOUNTER — Ambulatory Visit: Admitting: Physical Therapy

## 2024-08-05 DIAGNOSIS — M25662 Stiffness of left knee, not elsewhere classified: Secondary | ICD-10-CM

## 2024-08-05 DIAGNOSIS — Z96652 Presence of left artificial knee joint: Secondary | ICD-10-CM | POA: Diagnosis present

## 2024-08-05 DIAGNOSIS — M25562 Pain in left knee: Secondary | ICD-10-CM

## 2024-08-05 NOTE — Therapy (Signed)
 OUTPATIENT PHYSICAL THERAPY LOWER EXTREMITY TREATMENT   Patient Name: Joseph Foster MRN: [id number] DOB:[birth year], 78 y.o., male Today's Date: 08/05/2024  END OF SESSION:  PT End of Session - 08/05/24 1302     Visit Number 5    Date for Recertification  10/04/24    PT Start Time 1300    PT Stop Time 1345    PT Time Calculation (min) 45 min    Activity Tolerance Patient tolerated treatment well    Behavior During Therapy [facility] for tasks assessed/performed          Past Medical History:  Diagnosis Date   Acute medial meniscus tear of left knee    Anxiety    Aortic dilatation 05/12/2018   Mild, noted on ECHO   Arthritis KNEES   CHF (congestive heart failure) (HCC)    Chronic back pain    Chronic combined systolic and diastolic heart failure (HCC)    Closed left ankle fracture    COPD (chronic obstructive pulmonary disease) (HCC)    asymptomatic, denies shortness of breath   DDD (degenerative disc disease), lumbar L4   GERD (gastroesophageal reflux disease)    Grade I diastolic dysfunction 05/12/2018   Noted on ECHO   HOH (hard of hearing)    BIL hearing aides   Hyperlipidemia    Hypertension    Insomnia    LAD stenosis 03/2016   Mild, noted on heart catheterization   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   Noted on EKG   Lung nodule 2010   Left lung base nodule   Memory loss    Neuromuscular disorder (HCC)    neuropathy feet   Numbness and tingling in both hands    while riding motorcycle   Numbness and tingling of both feet    pinched nerve   Pneumonia    age 35's   Prostate cancer (HCC)    Ringing in ears    Resolved   TIA (transient ischemic attack) 2015   Past Surgical History:  Procedure Laterality Date   CARDIAC CATHETERIZATION N/A 04/29/2016   Procedure: Left Heart Cath and Coronary Angiography;  Surgeon: Alm LELON Clay, MD;  Location: [facility] INVASIVE CV LAB;  Service: Cardiovascular;  Laterality: N/A;   COLONOSCOPY     CYSTOSCOPY N/A  03/15/2019   Procedure: CYSTOSCOPY;  Surgeon: Ottelin, Mark, MD;  Location: [facility];  Service: Urology;  Laterality: N/A;  2 seeds detected and removed by Dr. Ceil   [facility] EEG 41-60MINS  08/23/2014       KNEE ARTHROSCOPY  01/15/2012   Procedure: ARTHROSCOPY KNEE;  Surgeon: Dempsey LULLA Moan, MD;  Location: [facility];  Service: Orthopedics;  Laterality: Left;  meniscal debridement   ORIF ANKLE FRACTURE Left 12/07/2020   Procedure: OPEN REDUCTION INTERNAL FIXATION (ORIF) left lateral malleolus fracture; stress exam left foot;  Surgeon: Kit Norleen, MD;  Location: [facility];  Service: Orthopedics;  Laterality: Left;    RADIOACTIVE SEED IMPLANT N/A 03/15/2019   Procedure: RADIOACTIVE SEED IMPLANT/BRACHYTHERAPY IMPLANT;  Surgeon: Ottelin, Mark, MD;  Location: [facility];  Service: Urology;  Laterality: N/A;  81 seeds   RIGHT KNEE SURG.  2005   SPINAL CORD STIMULATOR IMPLANT  2009   Still in place battery is not working in years   TONSILECTOMY/ADENOIDECTOMY WITH MYRINGOTOMY     TOTAL KNEE ARTHROPLASTY Right 12/26/2014   Procedure: TOTAL RIGHT  KNEE ARTHROPLASTY;  Surgeon: Dempsey Moan, MD;  Location: [facility];  Service: Orthopedics;  Laterality: Right;   TOTAL KNEE ARTHROPLASTY Left 06/28/2024   Procedure: ARTHROPLASTY, KNEE, TOTAL;  Surgeon: Melodi Lerner, MD;  Location: [facility];  Service: Orthopedics;  Laterality: Left;   UPPER GI ENDOSCOPY     VASECTOMY     Patient Active Problem List   Diagnosis Date Noted   Primary osteoarthritis of left knee 06/28/2024   Influenza 12/20/2023   AKI (acute kidney injury) 12/19/2023   Influenza A 12/19/2023   Acute on chronic respiratory failure with hypoxia (HCC) 12/19/2023   SOB (shortness of breath) 12/19/2023   Hyperlipidemia 12/26/2022   Malignant neoplasm of prostate (HCC) 01/20/2019   Laceration of finger of left hand 03/25/2018   Pain in finger of left hand 03/25/2018    Lumbar radiculopathy 12/05/2016   Degenerative disc disease, lumbar 11/04/2016   Facet arthropathy, lumbar 11/04/2016   Essential hypertension 08/14/2016   Abnormal nuclear stress test 04/29/2016   Coronary artery disease involving native coronary artery of native heart without angina pectoris    Chronic combined systolic and diastolic heart failure (HCC) 09/01/2015   OA (osteoarthritis) of knee 12/26/2014   Agitation    TIA (transient ischemic attack) 08/22/2014   Tobacco abuse 08/22/2014   Alcohol abuse 08/22/2014   Chronic pain 08/22/2014   Depression 08/22/2014   Confusion    Chronic bilateral low back pain without sciatica 05/27/2013   Pain in joint, lower leg 05/27/2013   Pain syndrome, chronic 05/27/2013   Numbness 04/19/2013   Acute medial meniscal tear 01/15/2012    PCP: Bernardino Boone  REFERRING PROVIDER: Roxie Mess   REFERRING DIAG:  M17.12 (ICD-10-CM) - Unilateral primary osteoarthritis, left knee    THERAPY DIAG:  Status post total left knee replacement  Acute pain of left knee  Stiffness of left knee, not elsewhere classified  Rationale for Evaluation and Treatment: Rehabilitation  ONSET DATE: 06/28/24 L TKA  SUBJECTIVE:   SUBJECTIVE STATEMENT: MD was very pleased yesterday  PERTINENT HISTORY: L TKA 06/28/24 ORIF ankle fx 12/07/20 R TKA 12/26/14  CHF, arthritis, COPD, DDD, GERD, neuropathy, hard of hearing  PAIN:  Are you having pain? Yes: NPRS scale: 7/10 Pain location: R knee Pain description: dull Aggravating factors: trying to find a comfortable spot to sleep  Relieving factors: pain meds, ice it  PRECAUTIONS: None  RED FLAGS: None   WEIGHT BEARING RESTRICTIONS: No  FALLS:  Has patient fallen in last 6 months? No  LIVING ENVIRONMENT: Lives with: lives with their spouse Lives in: House/apartment Stairs: Yes: Internal: 1 set steps; on left going up and can reach both and External: 2 from garage, 3 to front door steps; can reach  both Has following equipment at home: Single point cane and Walker - 2 wheeled  OCCUPATION: Retired  PLOF: Independent and Independent with gait  PATIENT GOALS: to heal   NEXT MD VISIT: 07/12/24  OBJECTIVE:  Note: Objective measures were completed at Evaluation unless otherwise noted.  DIAGNOSTIC FINDINGS: N/A in chart   COGNITION: Overall cognitive status: Within functional limits for tasks assessed     SENSATION: Light touch: numb around knee  EDEMA:  Swelling around L   POSTURE: rounded shoulders and flexed trunk   PALPATION: Warm, TTP around L knee   LOWER EXTREMITY ROM:  Active ROM Left eval Left 07/27/24   Hip flexion     Hip extension     Hip abduction     Hip adduction     Hip internal rotation  Hip external rotation     Knee flexion 100 115 119  Knee extension -20 11 8   Ankle dorsiflexion     Ankle plantarflexion     Ankle inversion     Ankle eversion      (Blank rows = not tested)  LOWER EXTREMITY MMT:  MMT Right eval Left eval  Hip flexion  4-  Hip extension    Hip abduction    Hip adduction    Hip internal rotation    Hip external rotation    Knee flexion  3+  Knee extension  3+  Ankle dorsiflexion    Ankle plantarflexion    Ankle inversion    Ankle eversion     (Blank rows = not tested)   FUNCTIONAL TESTS:  5 times sit to stand: 18s from elevated mat and 2 hand push off Timed up and go (TUG): 27s with SPC  GAIT: Distance walked: in clinic distances Assistive device utilized: Single point cane Level of assistance: Modified independence                                                                                                                               TREATMENT DATE:  08/05/24 NuStep L 5  x 4 min Bike L 4 x 4 min Leg press 60lb 2x12 6in step ups x10 each Sit to stant x10, x5 L knee PROM w/ end range holds   Patellar mobs Leg Ext 10lb 2x12  HS curls 45lb 2x12  07/29/24 NuStep L 5  x 6 min 40lb resisted  gait 4 way x 3 each 6in step ups x 10 each  L knee PROM w/ end range holds   Patellar mobs Leg Ext 10lb 2x10  HS curls 35lb 2x10 Sit to stand x 10   07/27/24 NuStep L 5 x 6 min L knee PROM w/ end range holds   Patellar mobs Goals  ROM  TUG 12.45 Sit to stand x10, x5 from elevated surface some reps with UE Leg press 40lb 2x10, RLE 20lb 2x5 Leg Ext 3lb 3x10   07/21/24 NuStep L5 x 6 min L knee PROM w/ end range holds   Patellar mobs  Quad sets LLE x15 Sit to stand 2x10 elevated surface  LLE LAQ 3lb 2x15  07/12/24 EVAL    PATIENT EDUCATION:  Education details: POC, HEP, icing  Person educated: Patient Education method: Medical Illustrator Education comprehension: verbalized understanding and returned demonstration  HOME EXERCISE PROGRAM: Access Code: [email or web address] URL: [email or web address] Date: 07/12/2024 Prepared by: Almetta Fam  Exercises - Seated Long Arc Quad  - 1 x daily - 7 x weekly - 2 sets - 10 reps - 3 hold - Seated Knee Flexion  - 1 x daily - 7 x weekly - 2 sets - 10 reps - 3 hold - Supine Active Straight Leg Raise  - 1 x daily - 7 x weekly - 2 sets - 10 reps - Mini Squat  with Counter Support  - 1 x daily - 7 x weekly - 2 sets - 10 reps - Sit to Stand with Arms Crossed  - 1 x daily - 7 x weekly - 2 sets - 10 reps  ASSESSMENT:  CLINICAL IMPRESSION: Patient is a 78 y.o. male who was seen today for physical therapy treatment for L TKA on 06/28/24.  Pt enters with good report from MD. L knee remains warm and swollen.  Cue for eccentric control with leg curls and extensions.  Compensation noted with sit to stands leaning to his R. End range pain with passive L knee stretching. He will benefit from PT to address his pain, increase his strength and ROM to improve his gait and allow him to return to PLOF.  OBJECTIVE IMPAIRMENTS: Abnormal gait, cardiopulmonary status limiting activity, decreased activity tolerance, decreased coordination,  difficulty walking, decreased ROM, decreased strength, increased edema, and pain.   ACTIVITY LIMITATIONS: squatting, stairs, transfers, and locomotion level  PARTICIPATION LIMITATIONS: driving, shopping, community activity, and yard work  PERSONAL FACTORS: Age and Fitness are also affecting patient's functional outcome.   REHAB POTENTIAL: Good  CLINICAL DECISION MAKING: Stable/uncomplicated  EVALUATION COMPLEXITY: Low  GOALS: Goals reviewed with patient? Yes   SHORT TERM GOALS: Target date: 08/23/24  Independent with initial HEP. Baseline:  Goal status: Met 07/27/24  2. Patient will be able to ascend/descend stairs with 1 HR and reciprocal step pattern safely to access home and community.  Baseline: doing steps 1 step at a time Goal status: progressing 08/05/24  3. Patient will complete TUG <15s w/o AD Baseline: 27s w/SPC Goal status: Met 12.45 07/27/24    LONG TERM GOALS: Target date: 10/05/23  Independent with advanced/ongoing HEP to improve outcomes and carryover.  Baseline:  Goal status: INITIAL  2.  Patient will demonstrate L knee flexion to 120 deg to ascend/descend stairs. Baseline: 100 Goal status: Progressing 07/27/24  3. Patient will demonstrate full L knee extension for safety with gait. Baseline: -20 Goal status: Progressing 07/27/24  4.  Patient will be able to ambulate 600' safely with LRAD and normal gait pattern to access community.  Baseline:  Goal status: Progressing 08/05/24  5.  Patient will demonstrate improved functional LE strength by completing 5x STS in <15 seconds.   Baseline: 18s elevated mat and UE push Goal status: INITIAL  PLAN:  PT FREQUENCY: 2x/week  PT DURATION: 12 weeks  PLANNED INTERVENTIONS: 97110-Therapeutic exercises, 97530- Therapeutic activity, 97112- Neuromuscular re-education, 97535- Self Care, [id number]- Manual therapy, [phone]- Gait training, [phone]- Vasopneumatic device, Patient/Family education, Balance training, Stair  training, Joint mobilization, Scar mobilization, Cryotherapy, and Moist heat  PLAN FOR NEXT SESSION: L knee PROM, L knee and functional strengthening as tolerated, vaso if he needs   Tanda KANDICE Sorrow, PTA 08/05/2024, 1:03 PM

## 2024-08-10 ENCOUNTER — Ambulatory Visit: Admitting: Physical Therapy

## 2024-08-10 ENCOUNTER — Encounter: Payer: Self-pay | Admitting: Physical Therapy

## 2024-08-10 DIAGNOSIS — M25562 Pain in left knee: Secondary | ICD-10-CM

## 2024-08-10 DIAGNOSIS — M25662 Stiffness of left knee, not elsewhere classified: Secondary | ICD-10-CM

## 2024-08-10 DIAGNOSIS — Z96652 Presence of left artificial knee joint: Secondary | ICD-10-CM | POA: Diagnosis not present

## 2024-08-10 NOTE — Therapy (Signed)
 OUTPATIENT PHYSICAL THERAPY LOWER EXTREMITY TREATMENT   Patient Name: OGLE HOEFFNER MRN: [id number] DOB:[birth year], 78 y.o., male Today's Date: 08/10/2024  END OF SESSION:  PT End of Session - 08/10/24 1259     Visit Number 6    Date for Recertification  10/04/24    PT Start Time 1300    PT Stop Time 1345    PT Time Calculation (min) 45 min    Activity Tolerance Patient tolerated treatment well    Behavior During Therapy [facility] for tasks assessed/performed          Past Medical History:  Diagnosis Date   Acute medial meniscus tear of left knee    Anxiety    Aortic dilatation 05/12/2018   Mild, noted on ECHO   Arthritis KNEES   CHF (congestive heart failure) (HCC)    Chronic back pain    Chronic combined systolic and diastolic heart failure (HCC)    Closed left ankle fracture    COPD (chronic obstructive pulmonary disease) (HCC)    asymptomatic, denies shortness of breath   DDD (degenerative disc disease), lumbar L4   GERD (gastroesophageal reflux disease)    Grade I diastolic dysfunction 05/12/2018   Noted on ECHO   HOH (hard of hearing)    BIL hearing aides   Hyperlipidemia    Hypertension    Insomnia    LAD stenosis 03/2016   Mild, noted on heart catheterization   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   Noted on EKG   Lung nodule 2010   Left lung base nodule   Memory loss    Neuromuscular disorder (HCC)    neuropathy feet   Numbness and tingling in both hands    while riding motorcycle   Numbness and tingling of both feet    pinched nerve   Pneumonia    age 3's   Prostate cancer (HCC)    Ringing in ears    Resolved   TIA (transient ischemic attack) 2015   Past Surgical History:  Procedure Laterality Date   CARDIAC CATHETERIZATION N/A 04/29/2016   Procedure: Left Heart Cath and Coronary Angiography;  Surgeon: Alm LELON Clay, MD;  Location: [facility] INVASIVE CV LAB;  Service: Cardiovascular;  Laterality: N/A;   COLONOSCOPY     CYSTOSCOPY N/A  03/15/2019   Procedure: CYSTOSCOPY;  Surgeon: Ottelin, Mark, MD;  Location: [facility];  Service: Urology;  Laterality: N/A;  2 seeds detected and removed by Dr. Ceil   [facility] EEG 41-60MINS  08/23/2014       KNEE ARTHROSCOPY  01/15/2012   Procedure: ARTHROSCOPY KNEE;  Surgeon: Dempsey LULLA Moan, MD;  Location: [facility];  Service: Orthopedics;  Laterality: Left;  meniscal debridement   ORIF ANKLE FRACTURE Left 12/07/2020   Procedure: OPEN REDUCTION INTERNAL FIXATION (ORIF) left lateral malleolus fracture; stress exam left foot;  Surgeon: Kit Norleen, MD;  Location: [facility];  Service: Orthopedics;  Laterality: Left;    RADIOACTIVE SEED IMPLANT N/A 03/15/2019   Procedure: RADIOACTIVE SEED IMPLANT/BRACHYTHERAPY IMPLANT;  Surgeon: Ottelin, Mark, MD;  Location: [facility];  Service: Urology;  Laterality: N/A;  81 seeds   RIGHT KNEE SURG.  2005   SPINAL CORD STIMULATOR IMPLANT  2009   Still in place battery is not working in years   TONSILECTOMY/ADENOIDECTOMY WITH MYRINGOTOMY     TOTAL KNEE ARTHROPLASTY Right 12/26/2014   Procedure: TOTAL RIGHT  KNEE ARTHROPLASTY;  Surgeon: Dempsey Moan, MD;  Location: [facility];  Service: Orthopedics;  Laterality: Right;   TOTAL KNEE ARTHROPLASTY Left 06/28/2024   Procedure: ARTHROPLASTY, KNEE, TOTAL;  Surgeon: Melodi Lerner, MD;  Location: [facility];  Service: Orthopedics;  Laterality: Left;   UPPER GI ENDOSCOPY     VASECTOMY     Patient Active Problem List   Diagnosis Date Noted   Primary osteoarthritis of left knee 06/28/2024   Influenza 12/20/2023   AKI (acute kidney injury) 12/19/2023   Influenza A 12/19/2023   Acute on chronic respiratory failure with hypoxia (HCC) 12/19/2023   SOB (shortness of breath) 12/19/2023   Hyperlipidemia 12/26/2022   Malignant neoplasm of prostate (HCC) 01/20/2019   Laceration of finger of left hand 03/25/2018   Pain in finger of left hand 03/25/2018    Lumbar radiculopathy 12/05/2016   Degenerative disc disease, lumbar 11/04/2016   Facet arthropathy, lumbar 11/04/2016   Essential hypertension 08/14/2016   Abnormal nuclear stress test 04/29/2016   Coronary artery disease involving native coronary artery of native heart without angina pectoris    Chronic combined systolic and diastolic heart failure (HCC) 09/01/2015   OA (osteoarthritis) of knee 12/26/2014   Agitation    TIA (transient ischemic attack) 08/22/2014   Tobacco abuse 08/22/2014   Alcohol abuse 08/22/2014   Chronic pain 08/22/2014   Depression 08/22/2014   Confusion    Chronic bilateral low back pain without sciatica 05/27/2013   Pain in joint, lower leg 05/27/2013   Pain syndrome, chronic 05/27/2013   Numbness 04/19/2013   Acute medial meniscal tear 01/15/2012    PCP: Bernardino Boone  REFERRING PROVIDER: Roxie Mess   REFERRING DIAG:  M17.12 (ICD-10-CM) - Unilateral primary osteoarthritis, left knee    THERAPY DIAG:  Status post total left knee replacement  Acute pain of left knee  Stiffness of left knee, not elsewhere classified  Rationale for Evaluation and Treatment: Rehabilitation  ONSET DATE: 06/28/24 L TKA  SUBJECTIVE:   SUBJECTIVE STATEMENT: Cold weather is getting to his knee a little bit. Knee is achy because of the weather  PERTINENT HISTORY: L TKA 06/28/24 ORIF ankle fx 12/07/20 R TKA 12/26/14  CHF, arthritis, COPD, DDD, GERD, neuropathy, hard of hearing  PAIN:  Are you having pain? Yes: NPRS scale: 2/10 Pain location: R knee Pain description: dull Aggravating factors: trying to find a comfortable spot to sleep  Relieving factors: pain meds, ice it  PRECAUTIONS: None  RED FLAGS: None   WEIGHT BEARING RESTRICTIONS: No  FALLS:  Has patient fallen in last 6 months? No  LIVING ENVIRONMENT: Lives with: lives with their spouse Lives in: House/apartment Stairs: Yes: Internal: 1 set steps; on left going up and can reach both and  External: 2 from garage, 3 to front door steps; can reach both Has following equipment at home: Single point cane and Walker - 2 wheeled  OCCUPATION: Retired  PLOF: Independent and Independent with gait  PATIENT GOALS: to heal   NEXT MD VISIT: 07/12/24  OBJECTIVE:  Note: Objective measures were completed at Evaluation unless otherwise noted.  DIAGNOSTIC FINDINGS: N/A in chart   COGNITION: Overall cognitive status: Within functional limits for tasks assessed     SENSATION: Light touch: numb around knee  EDEMA:  Swelling around L   POSTURE: rounded shoulders and flexed trunk   PALPATION: Warm, TTP around L knee   LOWER EXTREMITY ROM:  Active ROM Left eval Left 07/27/24 Left 08/05/24 08/10/24  Hip flexion      Hip extension      Hip abduction  Hip adduction      Hip internal rotation      Hip external rotation      Knee flexion 100 115 119 119  Knee extension -20 11 8 5   Ankle dorsiflexion      Ankle plantarflexion      Ankle inversion      Ankle eversion       (Blank rows = not tested)  LOWER EXTREMITY MMT:  MMT Right eval Left eval  Hip flexion  4-  Hip extension    Hip abduction    Hip adduction    Hip internal rotation    Hip external rotation    Knee flexion  3+  Knee extension  3+  Ankle dorsiflexion    Ankle plantarflexion    Ankle inversion    Ankle eversion     (Blank rows = not tested)   FUNCTIONAL TESTS:  5 times sit to stand: 18s from elevated mat and 2 hand push off Timed up and go (TUG): 27s with SPC  GAIT: Distance walked: in clinic distances Assistive device utilized: Single point cane Level of assistance: Modified independence                                                                                                                               TREATMENT DATE:  08/10/24 NuStep L 5 x 4 min Bike L 4 x 4 min 6in forward & lateral step ups x 10 each  L knee PROM w/ end range holds   Patellar mobs Sit to stand on  Airex mat elevated  Step Downs 4in LLE control x10, 2 rails 6in 2x5  HS curls LLE 25lb 2x10 Leg Ext LLE 2x5 limited ROM  08/05/24 NuStep L 5  x 4 min Bike L 4 x 4 min Leg press 60lb 2x12 6in step ups x10 each Sit to stant x10, x5 L knee PROM w/ end range holds   Patellar mobs Leg Ext 10lb 2x12  HS curls 45lb 2x12  07/29/24 NuStep L 5  x 6 min 40lb resisted gait 4 way x 3 each 6in step ups x 10 each  L knee PROM w/ end range holds   Patellar mobs Leg Ext 10lb 2x10  HS curls 35lb 2x10 Sit to stand x 10   07/27/24 NuStep L 5 x 6 min L knee PROM w/ end range holds   Patellar mobs Goals  ROM  TUG 12.45 Sit to stand x10, x5 from elevated surface some reps with UE Leg press 40lb 2x10, RLE 20lb 2x5 Leg Ext 3lb 3x10   07/21/24 NuStep L5 x 6 min L knee PROM w/ end range holds   Patellar mobs  Quad sets LLE x15 Sit to stand 2x10 elevated surface  LLE LAQ 3lb 2x15  07/12/24 EVAL    PATIENT EDUCATION:  Education details: POC, HEP, icing  Person educated: Patient Education method: Medical Illustrator Education comprehension: verbalized understanding and returned  demonstration  HOME EXERCISE PROGRAM: Access Code: [email or web address] URL: [email or web address] Date: 07/12/2024 Prepared by: Almetta Fam  Exercises - Seated Long Arc Quad  - 1 x daily - 7 x weekly - 2 sets - 10 reps - 3 hold - Seated Knee Flexion  - 1 x daily - 7 x weekly - 2 sets - 10 reps - 3 hold - Supine Active Straight Leg Raise  - 1 x daily - 7 x weekly - 2 sets - 10 reps - Mini Squat with Counter Support  - 1 x daily - 7 x weekly - 2 sets - 10 reps - Sit to Stand with Arms Crossed  - 1 x daily - 7 x weekly - 2 sets - 10 reps  ASSESSMENT:  CLINICAL IMPRESSION: Patient is a 78 y.o. male who was seen today for physical therapy treatment for L TKA on 06/28/24.  L knee remains warm and swollen. CGA needed with step ups due to instability and weakness. Compensation noted with sit to  stands, pt had a difficult time w/ S2S on airex. Eccentric weakness with step downs. Unable to reach full available ROM with SL extension.   He will benefit from PT to address his pain, increase his strength and ROM to improve his gait and allow him to return to PLOF.  OBJECTIVE IMPAIRMENTS: Abnormal gait, cardiopulmonary status limiting activity, decreased activity tolerance, decreased coordination, difficulty walking, decreased ROM, decreased strength, increased edema, and pain.   ACTIVITY LIMITATIONS: squatting, stairs, transfers, and locomotion level  PARTICIPATION LIMITATIONS: driving, shopping, community activity, and yard work  PERSONAL FACTORS: Age and Fitness are also affecting patient's functional outcome.   REHAB POTENTIAL: Good  CLINICAL DECISION MAKING: Stable/uncomplicated  EVALUATION COMPLEXITY: Low  GOALS: Goals reviewed with patient? Yes   SHORT TERM GOALS: Target date: 08/23/24  Independent with initial HEP. Baseline:  Goal status: Met 07/27/24  2. Patient will be able to ascend/descend stairs with 1 HR and reciprocal step pattern safely to access home and community.  Baseline: doing steps 1 step at a time Goal status: progressing 08/05/24  3. Patient will complete TUG <15s w/o AD Baseline: 27s w/SPC Goal status: Met 12.45 07/27/24    LONG TERM GOALS: Target date: 10/05/23  Independent with advanced/ongoing HEP to improve outcomes and carryover.  Baseline:  Goal status: INITIAL  2.  Patient will demonstrate L knee flexion to 120 deg to ascend/descend stairs. Baseline: 100 Goal status: Progressing 07/27/24, Partly met 08/10/24  3. Patient will demonstrate full L knee extension for safety with gait. Baseline: -20 Goal status: Progressing 07/27/24, Progressing 08/10/24  4.  Patient will be able to ambulate 600' safely with LRAD and normal gait pattern to access community.  Baseline:  Goal status: Progressing 08/05/24, Met 08/10/24  5.  Patient will  demonstrate improved functional LE strength by completing 5x STS in <15 seconds.   Baseline: 18s elevated mat and UE push Goal status: ongoing 08/10/24  PLAN:  PT FREQUENCY: 2x/week  PT DURATION: 12 weeks  PLANNED INTERVENTIONS: 97110-Therapeutic exercises, 97530- Therapeutic activity, 97112- Neuromuscular re-education, 97535- Self Care, [id number]- Manual therapy, [phone]- Gait training, [phone]- Vasopneumatic device, Patient/Family education, Balance training, Stair training, Joint mobilization, Scar mobilization, Cryotherapy, and Moist heat  PLAN FOR NEXT SESSION: L knee PROM, L knee and functional strengthening as tolerated, vaso if he needs   Tanda KANDICE Sorrow, PTA 08/10/2024, 12:59 PM

## 2024-08-12 ENCOUNTER — Ambulatory Visit

## 2024-10-01 ENCOUNTER — Other Ambulatory Visit: Payer: Self-pay | Admitting: Nurse Practitioner

## 2024-10-01 MED ORDER — CARVEDILOL 6.25 MG PO TABS: 6.2500 mg | ORAL_TABLET | Freq: Two times a day (BID) | ORAL | 1 refills | Status: AC

## 2024-10-25 ENCOUNTER — Ambulatory Visit

## 2024-10-27 ENCOUNTER — Ambulatory Visit

## 2024-10-27 MED ORDER — INCLISIRAN SODIUM 284 MG/1.5ML ~~LOC~~ SOSY: 284.0000 mg | PREFILLED_SYRINGE | Freq: Once | SUBCUTANEOUS | Status: AC

## 2024-10-29 ENCOUNTER — Encounter: Payer: Self-pay | Admitting: Cardiovascular Disease

## 2024-10-29 ENCOUNTER — Telehealth (HOSPITAL_COMMUNITY): Payer: Self-pay | Admitting: Pharmacist

## 2024-10-29 NOTE — Telephone Encounter (Signed)
 Patient requires updated Leqvio  orders. Last entered April 2024  Routing to Eleanor Crews, RPh-CPP for order renewal  Next dose is scheduled for 11/01/2024 (has active plan for this)  Sherry Pennant, PharmD, MPH, BCPS, CPP Clinical Pharmacist

## 2024-11-01 ENCOUNTER — Telehealth: Payer: Self-pay | Admitting: Pharmacy Technician

## 2024-11-01 ENCOUNTER — Other Ambulatory Visit: Payer: Self-pay | Admitting: Pharmacist

## 2024-11-01 ENCOUNTER — Ambulatory Visit

## 2024-11-01 NOTE — Telephone Encounter (Signed)
 Orders re-entered

## 2024-11-01 NOTE — Telephone Encounter (Signed)
 Auth Submission: NO AUTH NEEDED Site of care: Site of care: [facility] Payer: MEDICARE A/B & SUPP Medication & CPT/J Code(s) submitted: Leqvio  (Inclisiran) J1306 Diagnosis Code: E78.5 Route of submission (phone, fax, portal):  Phone # Fax # Auth type: Buy/Bill PB Units/visits requested: 284MG  Q6 MONTHS Reference number:  Approval from: 11/01/24 to 10/30/25

## 2024-11-02 ENCOUNTER — Ambulatory Visit

## 2024-11-02 VITALS — BP 131/85 | HR 73 | Temp 98.0°F | Resp 18 | Ht 74.0 in | Wt 256.4 lb

## 2024-11-02 DIAGNOSIS — I2510 Atherosclerotic heart disease of native coronary artery without angina pectoris: Secondary | ICD-10-CM | POA: Diagnosis not present

## 2024-11-02 DIAGNOSIS — E785 Hyperlipidemia, unspecified: Secondary | ICD-10-CM | POA: Diagnosis not present

## 2024-11-02 DIAGNOSIS — G459 Transient cerebral ischemic attack, unspecified: Secondary | ICD-10-CM

## 2024-11-02 MED ORDER — INCLISIRAN SODIUM 284 MG/1.5ML ~~LOC~~ SOSY
284.0000 mg | PREFILLED_SYRINGE | Freq: Once | SUBCUTANEOUS | Status: AC
  Administered 2024-11-02: 284 mg via SUBCUTANEOUS
  Filled 2024-11-02: qty 1.5

## 2024-11-02 NOTE — Progress Notes (Signed)
 Diagnosis: Hyperlipidemia  Provider:  Chilton Greathouse MD  Procedure: Injection  Leqvio (inclisiran), Dose: 284 mg, Site: subcutaneous, Number of injections: 1  Injection Site(s): Left arm  Post Care: Patient declined observation  Discharge: Condition: Good, Destination: Home . AVS Declined  Performed by:  Adriana Mccallum, RN

## 2024-11-08 ENCOUNTER — Ambulatory Visit: Admitting: Podiatry

## 2024-11-15 ENCOUNTER — Ambulatory Visit: Admitting: Student in an Organized Health Care Education/Training Program

## 2025-05-03 ENCOUNTER — Ambulatory Visit
